# Patient Record
Sex: Male | Born: 1940
Health system: Southern US, Community
[De-identification: ages and names within clinical notes are randomized; demographics above are authoritative.]

## PROBLEM LIST (undated history)

## (undated) DIAGNOSIS — I2510 Atherosclerotic heart disease of native coronary artery without angina pectoris: Secondary | ICD-10-CM

## (undated) DIAGNOSIS — J449 Chronic obstructive pulmonary disease, unspecified: Secondary | ICD-10-CM

## (undated) DIAGNOSIS — J45909 Unspecified asthma, uncomplicated: Secondary | ICD-10-CM

## (undated) DIAGNOSIS — F329 Major depressive disorder, single episode, unspecified: Secondary | ICD-10-CM

## (undated) DIAGNOSIS — F32A Depression, unspecified: Secondary | ICD-10-CM

## (undated) DIAGNOSIS — T7840XA Allergy, unspecified, initial encounter: Secondary | ICD-10-CM

## (undated) DIAGNOSIS — C61 Malignant neoplasm of prostate: Secondary | ICD-10-CM

## (undated) DIAGNOSIS — F419 Anxiety disorder, unspecified: Secondary | ICD-10-CM

## (undated) DIAGNOSIS — F1010 Alcohol abuse, uncomplicated: Secondary | ICD-10-CM

## (undated) DIAGNOSIS — E871 Hypo-osmolality and hyponatremia: Secondary | ICD-10-CM

## (undated) HISTORY — DX: Depression, unspecified: F32.A

## (undated) HISTORY — PX: EYE SURGERY: SHX253

## (undated) HISTORY — DX: Chronic obstructive pulmonary disease, unspecified: J44.9

## (undated) HISTORY — DX: Anxiety disorder, unspecified: F41.9

## (undated) HISTORY — DX: Allergy, unspecified, initial encounter: T78.40XA

## (undated) HISTORY — DX: Major depressive disorder, single episode, unspecified: F32.9

## (undated) HISTORY — DX: Unspecified asthma, uncomplicated: J45.909

## (undated) HISTORY — DX: Malignant neoplasm of prostate: C61

## (undated) HISTORY — PX: PROSTATE SURGERY: SHX751

## (undated) HISTORY — PX: SPINE SURGERY: SHX786

---

## 1998-08-24 ENCOUNTER — Ambulatory Visit (HOSPITAL_COMMUNITY): Admission: RE | Admit: 1998-08-24 | Discharge: 1998-08-24 | Payer: Self-pay | Admitting: Family Medicine

## 1998-09-13 ENCOUNTER — Ambulatory Visit (HOSPITAL_COMMUNITY): Admission: RE | Admit: 1998-09-13 | Discharge: 1998-09-13 | Payer: Self-pay | Admitting: Neurosurgery

## 1998-09-20 ENCOUNTER — Encounter: Payer: Self-pay | Admitting: Neurosurgery

## 1998-09-20 ENCOUNTER — Ambulatory Visit (HOSPITAL_COMMUNITY): Admission: RE | Admit: 1998-09-20 | Discharge: 1998-09-20 | Payer: Self-pay | Admitting: Neurosurgery

## 1998-09-28 ENCOUNTER — Encounter: Payer: Self-pay | Admitting: Neurosurgery

## 1998-09-30 ENCOUNTER — Inpatient Hospital Stay (HOSPITAL_COMMUNITY): Admission: RE | Admit: 1998-09-30 | Discharge: 1998-10-01 | Payer: Self-pay | Admitting: Neurosurgery

## 1998-12-20 ENCOUNTER — Encounter: Payer: Self-pay | Admitting: Neurosurgery

## 1998-12-20 ENCOUNTER — Ambulatory Visit (HOSPITAL_COMMUNITY): Admission: RE | Admit: 1998-12-20 | Discharge: 1998-12-20 | Payer: Self-pay | Admitting: Neurosurgery

## 2000-01-09 ENCOUNTER — Ambulatory Visit (HOSPITAL_COMMUNITY): Admission: RE | Admit: 2000-01-09 | Discharge: 2000-01-09 | Payer: Self-pay | Admitting: Neurosurgery

## 2004-04-17 ENCOUNTER — Ambulatory Visit (HOSPITAL_COMMUNITY): Admission: RE | Admit: 2004-04-17 | Discharge: 2004-04-17 | Payer: Self-pay | Admitting: Internal Medicine

## 2008-09-15 ENCOUNTER — Ambulatory Visit: Payer: Self-pay | Admitting: Internal Medicine

## 2008-10-04 ENCOUNTER — Ambulatory Visit (HOSPITAL_COMMUNITY): Admission: RE | Admit: 2008-10-04 | Discharge: 2008-10-04 | Payer: Self-pay | Admitting: Internal Medicine

## 2008-10-04 ENCOUNTER — Ambulatory Visit: Payer: Self-pay | Admitting: Internal Medicine

## 2010-04-24 ENCOUNTER — Ambulatory Visit (HOSPITAL_COMMUNITY): Admission: RE | Admit: 2010-04-24 | Discharge: 2010-04-24 | Payer: Self-pay | Admitting: Family Medicine

## 2010-06-30 ENCOUNTER — Encounter (INDEPENDENT_AMBULATORY_CARE_PROVIDER_SITE_OTHER): Payer: Self-pay | Admitting: *Deleted

## 2010-06-30 ENCOUNTER — Telehealth (INDEPENDENT_AMBULATORY_CARE_PROVIDER_SITE_OTHER): Payer: Self-pay

## 2010-07-06 ENCOUNTER — Telehealth (INDEPENDENT_AMBULATORY_CARE_PROVIDER_SITE_OTHER): Payer: Self-pay

## 2010-07-13 ENCOUNTER — Encounter (INDEPENDENT_AMBULATORY_CARE_PROVIDER_SITE_OTHER): Payer: Self-pay | Admitting: *Deleted

## 2011-01-23 NOTE — Progress Notes (Signed)
Summary: pt needs appt faxed to [facility] for gas voucher  Phone Note Call from Patient   Caller: Patient Summary of Call: Pt called and requests his appt date and time with Dr. Jena Gauss be faxed to: Demetrio Lapping @ [phone]  so he can get a gas voucher. ( This is at [facility]) Initial call taken by: Cloria Spring LPN,  June 30, 1190 3:15 PM     Appended Document: pt needs appt faxed to [facility] for gas voucher I faxed a reminder letter to Demetrio Lapping at [phone]

## 2011-01-23 NOTE — Progress Notes (Signed)
Summary: phone note/ pt cx appt for today  Phone Note Call from Patient   Caller: Patient Summary of Call: Pt called and spoke with Darl Pikes and cancelled appt for today, then called back and told me that he is not having any difficulty swallowing now is the reason that he cancelled. York Spaniel he will call back if he does have a problem. Initial call taken by: Cloria Spring LPN,  July 06, 2010 11:25 AM

## 2011-01-23 NOTE — Letter (Signed)
Summary: Generic Letter, Intro to Referring  [facility]  [address]   [place], [place] [address]   Phone: [phone]  Fax: [phone]      July 13, 2010             RE: Joshua Whitaker   04-Sep-1941                 [address]                 [place], [place]  [address]  Dear Kemper Durie,    Pt cancelled his appt for 07/06/10 @ 11am with Dr Jena Gauss            Sincerely,    Diana Eves  [facility] Ph: [phone]   Fax: [phone]

## 2011-01-23 NOTE — Letter (Signed)
Summary: Scheduled Appointment  [facility]  [address]   [place], [place] [address]   Phone: [phone]  Fax: [phone]    June 30, 2010   Dear: Jesse Sans Heizer            DOB: 11/16/1941    I have been instructed to schedule you an appointment in our office.  Your appointment is as follows:   Date:             July 06, 2010   Time:             11AM    Please be here 15 minutes early.   Provider:         DR Jena Gauss    Please contact the office if you need to reschedule this appointment for a more convenient time.   Thank you,    Diana Eves       [facility] Ph: [phone]   Fax: [phone]

## 2011-03-20 DIAGNOSIS — I10 Essential (primary) hypertension: Secondary | ICD-10-CM | POA: Insufficient documentation

## 2011-03-20 DIAGNOSIS — E785 Hyperlipidemia, unspecified: Secondary | ICD-10-CM | POA: Insufficient documentation

## 2011-03-20 DIAGNOSIS — J431 Panlobular emphysema: Secondary | ICD-10-CM | POA: Insufficient documentation

## 2011-05-08 NOTE — Op Note (Signed)
NAME:  LOVEL, SUAZO             ACCOUNT NO.:  [id number]   MEDICAL RECORD NO.:  [id number]          PATIENT TYPE:  AMB   LOCATION:  DAY                           FACILITY:  [facility]   PHYSICIAN:  R. Roetta Sessions, M.D. DATE OF BIRTH:  04-06-1941   DATE OF PROCEDURE:  10/04/2008  DATE OF DISCHARGE:  10/04/2008                               OPERATIVE REPORT   DIAGNOSTIC COLONOSCOPY   INDICATIONS FOR PROCEDURE:  A 70 year old gentleman with change in bowel  habits since prostatectomy over a year ago.  He feels he has incomplete  evacuation.  His last colonoscopy was over 4-1/2 years ago.  Colonoscopy  is now being done.  Risks, benefits, alternatives and limitations have  been reviewed, questions answered.  Please see documentation in the  medical record.   PROCEDURE NOTE:  O2 saturation, blood pressure, pulse, and respirations  were monitored throughout the entire procedure.  Conscious sedation with  Versed 3 mg IV, Demerol 75 mg IV in divided doses.   INSTRUMENT:  Pentax video chip system.   FINDINGS:  Digital rectal exam revealed no abnormalities.   ENDOSCOPIC FINDINGS:  Prep was adequate.   Colon:  Colonic mucosa was surveyed from the rectosigmoid junction  through the left, transverse, and right colon to the appendiceal  orifice, ileocecal valve and cecum.  These structures well seen and  photographed for the record.  Terminal ileum was intubated to 10 cm.  From this level, scope was slowly and cautiously withdrawn.  All  previously mentioned mucosal surfaces were again seen.  The patient was  noted to have pan colonic diverticula.  The remainder of the colonic  mucosa and terminal ileal mucosa appeared entirely normal.  The scope  was pulled down in the rectum where thorough examination of the rectal  mucosa including retroflexed view of the anal verge demonstrated no  abnormalities.  The patient tolerated the procedure well and was  reactive to endoscopy.   IMPRESSION:  1. Normal rectum.  2. Pan colonic diverticula.  The remainder of the colonic mucosa      appeared normal.  Normal terminal ileum.   RECOMMENDATIONS:  1. [facility] Colon Health probiotic 1 capsule daily.  2. Daily Metamucil or Citrucel fiber supplement.  3. Follow-up appointment with [place] as needed.      [name], M.D.  Electronically Signed     RMR/MEDQ  D:  10/29/2008  T:  10/29/2008  Job:  [id number]   cc:   Koren Bound, M.D.
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
## 2011-05-11 NOTE — Op Note (Signed)
NAME:  Joshua Whitaker, Joshua Whitaker                       ACCOUNT NO.:  [id number]   MEDICAL RECORD NO.:  [id number]                   PATIENT TYPE:  AMB   LOCATION:  DAY                                  FACILITY:  [facility]   PHYSICIAN:  R. Roetta Sessions, M.D.              DATE OF BIRTH:  1941-06-09   DATE OF PROCEDURE:  04/17/2004  DATE OF DISCHARGE:  04/17/2004                                 OPERATIVE REPORT   PROCEDURE:  Colonoscopy with biopsy.   INDICATIONS FOR PROCEDURE:  The patient is a 70 year old gentleman who comes  for colorectal cancer screening.  He is devoid of any lower GI tract  symptoms.  He had a colonoscopy back in 1993 at [facility] which  revealed only left-sided diverticula.  No family history of colorectal  neoplasia.  Colonoscopy is now being done as a screening maneuver.  This  approach has been discussed with the patient at length.  The potential  risks, benefits, and alternatives have been reviewed.  Please see my  handwritten H&P for more information.   PROCEDURE:  O2 saturation, blood pressure, pulses, and respirations were  monitored throughout the entirety of the procedure.  Conscious sedation was  with Versed 2 mg IV, Demerol 50 mg IV in divided doses.  The instrument used  was the Olympus video chip system.   FINDINGS:  Digital rectal examination revealed no abnormalities.   ENDOSCOPIC FINDINGS:  The prep was adequate.   Rectum:  Examination of the rectal mucosa including retroflex view of the  anal verge revealed four 3-mm polyps at approximately 8 cm.  The remainder  of the rectal mucosa appeared normal.   Colon:  The colonic mucosa was surveyed from the rectosigmoid junction  through the left, transverse, right colon to the area of the appendiceal  orifice, ileocecal valve, and cecum.  These structures were well-seen and  photographed for the record.  From this level, the scope was slowly  withdrawn.  All previously mentioned mucosal surfaces were  again seen.  The  patient was noted to have only left-sided diverticula.  The remainder of the  colonic mucosa appeared normal.  The polyps in the rectum were cold  biopsy/removed.  The patient tolerated the procedure well and was reactive  in endoscopy.   IMPRESSION:  1. Diminutive polyps in the rectum, as described above.  The remainder of     the rectal mucosa appeared normal.  2. Left-sided diverticula.  The remainder of the colonic mucosa appeared     normal.   RECOMMENDATIONS:  1. Diverticulosis literature provided to [name].  2. Follow up on pathology.  3. Further recommendations to follow.      ___________________________________________  [name], M.D.   RMR/MEDQ  D:  04/25/2004  T:  04/25/2004  Job:  [id number]   cc:   Doreen Beam  [address]  [place]  [place] [address]  Fax: [phone]

## 2014-04-21 DIAGNOSIS — Z8546 Personal history of malignant neoplasm of prostate: Secondary | ICD-10-CM | POA: Insufficient documentation

## 2015-10-12 DIAGNOSIS — R911 Solitary pulmonary nodule: Secondary | ICD-10-CM | POA: Insufficient documentation

## 2015-10-17 ENCOUNTER — Other Ambulatory Visit (HOSPITAL_COMMUNITY): Payer: Self-pay | Admitting: Family Medicine

## 2015-10-17 DIAGNOSIS — R911 Solitary pulmonary nodule: Secondary | ICD-10-CM

## 2015-10-20 ENCOUNTER — Other Ambulatory Visit (HOSPITAL_COMMUNITY): Payer: Self-pay

## 2015-11-01 ENCOUNTER — Ambulatory Visit (HOSPITAL_COMMUNITY): Payer: Medicare Other

## 2015-11-28 ENCOUNTER — Ambulatory Visit (HOSPITAL_COMMUNITY)
Admission: RE | Admit: 2015-11-28 | Discharge: 2015-11-28 | Disposition: A | Discharge status: Home / Self Care | Payer: Medicare Other | Source: Ambulatory Visit | Attending: Family Medicine | Admitting: Family Medicine

## 2015-11-28 DIAGNOSIS — R911 Solitary pulmonary nodule: Secondary | ICD-10-CM | POA: Insufficient documentation

## 2015-11-28 DIAGNOSIS — F17200 Nicotine dependence, unspecified, uncomplicated: Secondary | ICD-10-CM | POA: Diagnosis not present

## 2016-10-08 ENCOUNTER — Other Ambulatory Visit (HOSPITAL_COMMUNITY): Payer: Self-pay | Admitting: Family Medicine

## 2016-10-08 DIAGNOSIS — R9389 Abnormal findings on diagnostic imaging of other specified body structures: Secondary | ICD-10-CM

## 2016-10-25 ENCOUNTER — Ambulatory Visit (HOSPITAL_COMMUNITY): Payer: Medicare Other

## 2016-10-26 ENCOUNTER — Ambulatory Visit (HOSPITAL_COMMUNITY)
Admission: RE | Admit: 2016-10-26 | Discharge: 2016-10-26 | Disposition: A | Discharge status: Home / Self Care | Payer: Medicare Other | Source: Ambulatory Visit | Attending: Family Medicine | Admitting: Family Medicine

## 2016-10-26 DIAGNOSIS — I700 Atherosclerosis of aorta: Secondary | ICD-10-CM | POA: Diagnosis not present

## 2016-10-26 DIAGNOSIS — J439 Emphysema, unspecified: Secondary | ICD-10-CM | POA: Insufficient documentation

## 2016-10-26 DIAGNOSIS — R938 Abnormal findings on diagnostic imaging of other specified body structures: Secondary | ICD-10-CM | POA: Insufficient documentation

## 2016-10-26 DIAGNOSIS — R918 Other nonspecific abnormal finding of lung field: Secondary | ICD-10-CM | POA: Diagnosis not present

## 2016-10-26 DIAGNOSIS — R9389 Abnormal findings on diagnostic imaging of other specified body structures: Secondary | ICD-10-CM

## 2017-06-27 ENCOUNTER — Other Ambulatory Visit (HOSPITAL_COMMUNITY): Payer: Self-pay | Admitting: Family Medicine

## 2017-06-27 DIAGNOSIS — R911 Solitary pulmonary nodule: Secondary | ICD-10-CM

## 2017-07-03 ENCOUNTER — Ambulatory Visit (HOSPITAL_COMMUNITY): Payer: Medicare Other

## 2017-07-09 ENCOUNTER — Ambulatory Visit (HOSPITAL_COMMUNITY): Payer: Medicare Other

## 2017-07-09 ENCOUNTER — Encounter (HOSPITAL_COMMUNITY): Payer: Self-pay

## 2017-12-03 ENCOUNTER — Ambulatory Visit: Payer: Self-pay | Admitting: Family Medicine

## 2017-12-27 ENCOUNTER — Ambulatory Visit: Payer: Self-pay | Admitting: Family Medicine

## 2018-01-03 ENCOUNTER — Ambulatory Visit (INDEPENDENT_AMBULATORY_CARE_PROVIDER_SITE_OTHER): Payer: Medicare Other | Admitting: Family Medicine

## 2018-01-03 ENCOUNTER — Encounter: Payer: Self-pay | Admitting: Family Medicine

## 2018-01-03 VITALS — BP 131/70 | HR 88 | Temp 97.0°F | Ht 67.0 in | Wt 134.2 lb

## 2018-01-03 DIAGNOSIS — J431 Panlobular emphysema: Secondary | ICD-10-CM | POA: Diagnosis not present

## 2018-01-03 DIAGNOSIS — R1900 Intra-abdominal and pelvic swelling, mass and lump, unspecified site: Secondary | ICD-10-CM

## 2018-01-03 DIAGNOSIS — R911 Solitary pulmonary nodule: Secondary | ICD-10-CM

## 2018-01-03 DIAGNOSIS — R630 Anorexia: Secondary | ICD-10-CM | POA: Diagnosis not present

## 2018-01-03 DIAGNOSIS — R799 Abnormal finding of blood chemistry, unspecified: Secondary | ICD-10-CM | POA: Diagnosis not present

## 2018-01-03 MED ORDER — AZITHROMYCIN 250 MG PO TABS: ORAL_TABLET | ORAL | 0 refills | Status: DC

## 2018-01-03 MED ORDER — MIRTAZAPINE 7.5 MG PO TABS
7.5000 mg | ORAL_TABLET | Freq: Every day | ORAL | 1 refills | Status: DC
Start: 2018-01-03 — End: 2019-01-06

## 2018-01-03 MED ORDER — TRIAMCINOLONE ACETONIDE 40 MG/ML IJ SUSP
40.0000 mg | Freq: Once | INTRAMUSCULAR | Status: AC
  Administered 2018-01-03: 40 mg via INTRAMUSCULAR

## 2018-01-03 MED ORDER — ADVAIR DISKUS 250-50 MCG/DOSE IN AEPB: 1.0000 | INHALATION_SPRAY | Freq: Two times a day (BID) | RESPIRATORY_TRACT | 11 refills | Status: DC

## 2018-01-03 NOTE — Patient Instructions (Addendum)
Great to see you!  Come back in 3-4 weeks  Please call or come back if your breathing is not improving.

## 2018-01-03 NOTE — Progress Notes (Signed)
HPI  Patient presents today tablets care.  Patient states that he feels like he is losing his appetite and having difficulty breathing.  This is been going on for about 6 weeks. On review of his records he has had a 1.2 cm pulmonary nodule in the right side seen 3 years ago that until now he has been refusing to have worked up.  He states that he is okay with a CT scan.   He still smokes. He has been smoking since he was 77 years old.  Denies fever, chills, sweats. Is tolerating food and fluids like usual but has a decreased appetite.  PMH: Prostate cancer, emphysema, depression, pulmonary nodule Surgical history: Prostate surgery Family history: Multiple cancers including lung, prostate, kidney, colon, bone cancer in multiple relatives, Social history: Smokes 1 pack/day, has smoked since 77 years old ROS: Per HPI  Objective: BP 131/70   Pulse 88   Temp (!) 97 F (36.1 C) (Oral)   Ht _0  (1.702 m)   Wt 134 lb 3.2 oz (60.9 kg)   SpO2 96%   BMI 21.02 kg/m  Gen: NAD, alert, cooperative with exam HEENT: NCAT, oropharynx moist and clear, TMs normal bilaterally, arcus senilis bilaterally, PERRLA CV: RRR, good S1/S2, no murmur Resp: CTABL, no wheezes, non-labored Abd: Soft, nontender, palpable abdominal mass midline Ext: No edema, warm Neuro: Alert and oriented, No gross deficits  Assessment and plan:  #Panlobular emphysema, COPD with exacerbation Treating with IM Kenalog plus azithromycin today, patient will call back in 2-3 days if breathing is not improving I will give him oral prednisone. I chose not to give him oral prednisone today due to ease of compliance with starting other pills  #Pulmonary nodule Patient with 1.2 cm right pulmonary nodule, now today he comes in complaining of decreased appetite and fatigue for about 6 weeks I am concerned that this has progressed, until now he has been refusing workup with his PCP. CT ordered  #Palpable abdominal  mass Ultrasound of the abdomen, heavy smoker  Decreased appetite Likely multifactorial, however I am concerned about underlying malignancy Remeron given due to the patient requesting something to help him eat, this was his primary complaint today  We have carefully reviewed his inhalers and he now understands to take Advair twice daily   Orders Placed This Encounter  Procedures  . CT Chest Wo Contrast    Standing Status:   Future    Standing Expiration Date:   03/04/2019    Order Specific Question:   Preferred imaging location?    Answer:   [facility]    Order Specific Question:   Radiology Contrast Protocol - do NOT remove file path    Answer:   [email or web address]  . [place] Art/Ven Flow Abd Pelv Doppler    Standing Status:   Future    Standing Expiration Date:   03/04/2019    Order Specific Question:   Reason for Exam (SYMPTOM  OR DIAGNOSIS REQUIRED)    Answer:   palpable abd mass    Order Specific Question:   Preferred imaging location?    Answer:   [place] CBC with Differential/Platelet  . Lipid panel  . TSH    Meds ordered this encounter  Medications  . mirtazapine (REMERON) 7.5 MG tablet    Sig: Take 1 tablet (7.5 mg total) by mouth at bedtime.    Dispense:  30 tablet    Refill:  1  . azithromycin (  ZITHROMAX) 250 MG tablet    Sig: Take 2 tablets on day 1 and 1 tablet daily after that    Dispense:  6 tablet    Refill:  0  . ADVAIR DISKUS 250-50 MCG/DOSE AEPB    Sig: Inhale 1 puff into the lungs 2 (two) times daily.    Dispense:  60 each    Refill:  [place], MD [facility] 01/03/2018, 2:06 PM

## 2018-01-03 NOTE — Addendum Note (Signed)
Addended by: Nigel Berthold C on: 01/03/2018 02:54 PM   Modules accepted: Orders

## 2018-01-04 LAB — LIPID PANEL
CHOL/HDL RATIO: 3.5 ratio (ref 0.0–5.0)
CHOLESTEROL TOTAL: 152 mg/dL (ref 100–199)
HDL: 43 mg/dL (ref >39)
LDL CALC: 95 mg/dL (ref 0–99)
TRIGLYCERIDES: 71 mg/dL (ref 0–149)
VLDL CHOLESTEROL CAL: 14 mg/dL (ref 5–40)

## 2018-01-04 LAB — CMP14+EGFR
A/G RATIO: 1.1 — AB (ref 1.2–2.2)
ALK PHOS: 69 IU/L (ref 39–117)
ALT: 11 IU/L (ref 0–44)
AST: 19 IU/L (ref 0–40)
Albumin: 3.4 g/dL — ABNORMAL LOW (ref 3.5–4.8)
BUN/Creatinine Ratio: 15 (ref 10–24)
BUN: 9 mg/dL (ref 8–27)
Bilirubin Total: 0.4 mg/dL (ref 0.0–1.2)
CALCIUM: 8.9 mg/dL (ref 8.6–10.2)
CO2: 21 mmol/L (ref 20–29)
CREATININE: 0.6 mg/dL — AB (ref 0.76–1.27)
Chloride: 94 mmol/L — ABNORMAL LOW (ref 96–106)
GFR calc Af Amer: 114 mL/min/{1.73_m2} (ref >59)
GFR calc non Af Amer: 99 mL/min/{1.73_m2} (ref >59)
GLOBULIN, TOTAL: 3.1 g/dL (ref 1.5–4.5)
Glucose: 74 mg/dL (ref 65–99)
POTASSIUM: 5.1 mmol/L (ref 3.5–5.2)
SODIUM: 128 mmol/L — AB (ref 134–144)
Total Protein: 6.5 g/dL (ref 6.0–8.5)

## 2018-01-04 LAB — CBC WITH DIFFERENTIAL/PLATELET
Basophils Absolute: 0.1 10*3/uL (ref 0.0–0.2)
Basos: 1 %
EOS (ABSOLUTE): 0.1 10*3/uL (ref 0.0–0.4)
Eos: 1 %
Hematocrit: 41.4 % (ref 37.5–51.0)
Hemoglobin: 14 g/dL (ref 13.0–17.7)
IMMATURE GRANS (ABS): 0.2 10*3/uL — AB (ref 0.0–0.1)
IMMATURE GRANULOCYTES: 2 %
LYMPHS: 17 %
Lymphocytes Absolute: 1.7 10*3/uL (ref 0.7–3.1)
MCH: 31.3 pg (ref 26.6–33.0)
MCHC: 33.8 g/dL (ref 31.5–35.7)
MCV: 92 fL (ref 79–97)
MONOS ABS: 1.2 10*3/uL — AB (ref 0.1–0.9)
Monocytes: 12 %
NEUTROS PCT: 67 %
Neutrophils Absolute: 6.7 10*3/uL (ref 1.4–7.0)
Platelets: 516 10*3/uL — ABNORMAL HIGH (ref 150–379)
RBC: 4.48 x10E6/uL (ref 4.14–5.80)
RDW: 13.2 % (ref 12.3–15.4)
WBC: 9.9 10*3/uL (ref 3.4–10.8)

## 2018-01-04 LAB — TSH: TSH: 1.75 u[IU]/mL (ref 0.450–4.500)

## 2018-01-07 ENCOUNTER — Telehealth: Payer: Self-pay | Admitting: Family Medicine

## 2018-01-07 ENCOUNTER — Other Ambulatory Visit: Payer: Self-pay

## 2018-01-07 DIAGNOSIS — D75839 Thrombocytosis, unspecified: Secondary | ICD-10-CM

## 2018-01-07 DIAGNOSIS — E871 Hypo-osmolality and hyponatremia: Secondary | ICD-10-CM

## 2018-01-07 DIAGNOSIS — D473 Essential (hemorrhagic) thrombocythemia: Secondary | ICD-10-CM

## 2018-01-08 NOTE — Telephone Encounter (Signed)
Refer to lab note °

## 2018-01-09 ENCOUNTER — Ambulatory Visit: Payer: Self-pay | Admitting: Family Medicine

## 2018-01-09 ENCOUNTER — Other Ambulatory Visit: Payer: Medicare Other

## 2018-01-09 DIAGNOSIS — E871 Hypo-osmolality and hyponatremia: Secondary | ICD-10-CM | POA: Diagnosis not present

## 2018-01-09 DIAGNOSIS — D473 Essential (hemorrhagic) thrombocythemia: Secondary | ICD-10-CM | POA: Diagnosis not present

## 2018-01-09 DIAGNOSIS — D75839 Thrombocytosis, unspecified: Secondary | ICD-10-CM

## 2018-01-10 LAB — CBC WITH DIFFERENTIAL/PLATELET
BASOS ABS: 0.1 10*3/uL (ref 0.0–0.2)
Basos: 1 %
EOS (ABSOLUTE): 0.1 10*3/uL (ref 0.0–0.4)
Eos: 2 %
HEMOGLOBIN: 13.9 g/dL (ref 13.0–17.7)
Hematocrit: 42.1 % (ref 37.5–51.0)
IMMATURE GRANS (ABS): 0.1 10*3/uL (ref 0.0–0.1)
Immature Granulocytes: 2 %
LYMPHS: 30 %
Lymphocytes Absolute: 2.1 10*3/uL (ref 0.7–3.1)
MCH: 31.2 pg (ref 26.6–33.0)
MCHC: 33 g/dL (ref 31.5–35.7)
MCV: 95 fL (ref 79–97)
MONOCYTES: 11 %
Monocytes Absolute: 0.8 10*3/uL (ref 0.1–0.9)
Neutrophils Absolute: 3.9 10*3/uL (ref 1.4–7.0)
Neutrophils: 54 %
Platelets: 385 10*3/uL — ABNORMAL HIGH (ref 150–379)
RBC: 4.45 x10E6/uL (ref 4.14–5.80)
RDW: 13.3 % (ref 12.3–15.4)
WBC: 7.1 10*3/uL (ref 3.4–10.8)

## 2018-01-10 LAB — BMP8+EGFR
BUN/Creatinine Ratio: 22 (ref 10–24)
BUN: 15 mg/dL (ref 8–27)
CALCIUM: 8.9 mg/dL (ref 8.6–10.2)
CHLORIDE: 93 mmol/L — AB (ref 96–106)
CO2: 19 mmol/L — ABNORMAL LOW (ref 20–29)
CREATININE: 0.67 mg/dL — AB (ref 0.76–1.27)
GFR calc non Af Amer: 95 mL/min/{1.73_m2} (ref >59)
GFR, EST AFRICAN AMERICAN: 109 mL/min/{1.73_m2} (ref >59)
GLUCOSE: 81 mg/dL (ref 65–99)
Potassium: 5.5 mmol/L — ABNORMAL HIGH (ref 3.5–5.2)
Sodium: 132 mmol/L — ABNORMAL LOW (ref 134–144)

## 2018-01-11 NOTE — Telephone Encounter (Signed)
Pt calling to talk to nurse about lab results

## 2018-01-13 ENCOUNTER — Ambulatory Visit: Payer: Self-pay | Admitting: Family Medicine

## 2018-01-13 MED ORDER — MIRABEGRON ER 25 MG PO TB24: 25.0000 mg | ORAL_TABLET | Freq: Every day | ORAL | 2 refills | Status: DC

## 2018-01-13 NOTE — Telephone Encounter (Signed)
Advised pt RX sent in and of MD feedback, pt voiced understanding.

## 2018-01-13 NOTE — Telephone Encounter (Signed)
I have sent myrbetriq for his urinary complaints, t typically takes 4-8 weeks to take full effect.   Laroy Apple, MD [facility] 01/13/2018, 10:03 AM

## 2018-01-13 NOTE — Telephone Encounter (Signed)
Spoke to pt regarding his test results and pt voiced understanding and is also requesting something to keep him from going to the bathroom as much. He says he discussed it at his last visit. He states he wets himself before he can make it to the bathroom.

## 2018-01-17 ENCOUNTER — Other Ambulatory Visit (HOSPITAL_COMMUNITY): Payer: Medicare Other

## 2018-01-17 ENCOUNTER — Ambulatory Visit (HOSPITAL_COMMUNITY): Admission: RE | Admit: 2018-01-17 | Payer: Medicare Other | Source: Ambulatory Visit

## 2018-01-21 DIAGNOSIS — J449 Chronic obstructive pulmonary disease, unspecified: Secondary | ICD-10-CM | POA: Diagnosis not present

## 2018-01-24 ENCOUNTER — Ambulatory Visit (HOSPITAL_COMMUNITY)
Admission: RE | Admit: 2018-01-24 | Discharge: 2018-01-24 | Disposition: A | Discharge status: Home / Self Care | Payer: Medicare Other | Source: Ambulatory Visit | Attending: Family Medicine | Admitting: Family Medicine

## 2018-01-24 ENCOUNTER — Telehealth: Payer: Self-pay | Admitting: Family Medicine

## 2018-01-24 DIAGNOSIS — I2510 Atherosclerotic heart disease of native coronary artery without angina pectoris: Secondary | ICD-10-CM | POA: Insufficient documentation

## 2018-01-24 DIAGNOSIS — R911 Solitary pulmonary nodule: Secondary | ICD-10-CM

## 2018-01-24 DIAGNOSIS — J439 Emphysema, unspecified: Secondary | ICD-10-CM | POA: Insufficient documentation

## 2018-01-24 DIAGNOSIS — R1906 Epigastric swelling, mass or lump: Secondary | ICD-10-CM | POA: Diagnosis not present

## 2018-01-24 DIAGNOSIS — R918 Other nonspecific abnormal finding of lung field: Secondary | ICD-10-CM | POA: Diagnosis not present

## 2018-01-24 DIAGNOSIS — I700 Atherosclerosis of aorta: Secondary | ICD-10-CM | POA: Diagnosis not present

## 2018-01-24 DIAGNOSIS — R1900 Intra-abdominal and pelvic swelling, mass and lump, unspecified site: Secondary | ICD-10-CM | POA: Diagnosis not present

## 2018-01-24 NOTE — Telephone Encounter (Signed)
Pt aware of CT and [place]

## 2018-02-03 ENCOUNTER — Ambulatory Visit (INDEPENDENT_AMBULATORY_CARE_PROVIDER_SITE_OTHER): Payer: Medicare Other | Admitting: Family Medicine

## 2018-02-03 ENCOUNTER — Encounter: Payer: Self-pay | Admitting: Family Medicine

## 2018-02-03 VITALS — BP 145/74 | HR 74 | Temp 98.3°F | Ht 67.0 in | Wt 136.4 lb

## 2018-02-03 DIAGNOSIS — R630 Anorexia: Secondary | ICD-10-CM

## 2018-02-03 DIAGNOSIS — R911 Solitary pulmonary nodule: Secondary | ICD-10-CM

## 2018-02-03 DIAGNOSIS — J431 Panlobular emphysema: Secondary | ICD-10-CM | POA: Diagnosis not present

## 2018-02-03 DIAGNOSIS — E875 Hyperkalemia: Secondary | ICD-10-CM

## 2018-02-03 MED ORDER — UMECLIDINIUM-VILANTEROL 62.5-25 MCG/INH IN AEPB: 1.0000 | INHALATION_SPRAY | Freq: Every day | RESPIRATORY_TRACT | 11 refills | Status: DC

## 2018-02-03 NOTE — Patient Instructions (Addendum)
Great to see you!  Stop advair, continue Anoro 1 puff once daily.

## 2018-02-03 NOTE — Progress Notes (Signed)
HPI  Patient presents today to follow-up for shortness of breath.  Patient has emphysema, We tried Anoro instead of advair and he states he can tell a big difference.   He states that he is no longer having urinary symptoms.  He is also stopped taking Remeron for appetite stimulation.  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  PMH: Smoking status noted ROS: Per HPI  Objective: BP (!) 145/74   Pulse 74   Temp 98.3 F (36.8 C) (Oral)   Ht 5' 7" (1.702 m)   Wt 136 lb 6.4 oz (61.9 kg)   SpO2 94%   BMI 21.36 kg/m  Gen: NAD, alert, cooperative with exam HEENT: NCAT, EOMI, PERRL CV: RRR, good S1/S2, no murmur Resp: CTABL, no wheezes, non-labored Abd: SNTND, BS present, no guarding or organomegaly Ext: No edema, warm Neuro: Alert and oriented, No gross deficits  Assessment and plan:  #Emphysema Patient doing better on anticholinergic inhaler compared to steroid all, changing over to Anoro Recommended decreasing and stopping smoking, he is cutting back.  #Decreased appetite Patient states that he did notice a little improvement with mirtazapine, however he stopped about 1 week ago. He has had approximately 2 pound weight gain.  # hyperkalemia Repeat labs, asymptomatic  #Left-sided pulmonary nodule New nodule, repeat CT in 3 months, recommended patient follow-up in 2 months when we will order the CT     Orders Placed This Encounter  Procedures  . Borden, MD [facility] 02/03/2018, 2:08 PM

## 2018-02-04 LAB — BMP8+EGFR
BUN/Creatinine Ratio: 18 (ref 10–24)
BUN: 12 mg/dL (ref 8–27)
CO2: 21 mmol/L (ref 20–29)
Calcium: 8.6 mg/dL (ref 8.6–10.2)
Chloride: 92 mmol/L — ABNORMAL LOW (ref 96–106)
Creatinine, Ser: 0.68 mg/dL — ABNORMAL LOW (ref 0.76–1.27)
GFR calc Af Amer: 109 mL/min/{1.73_m2} (ref >59)
GFR calc non Af Amer: 94 mL/min/{1.73_m2} (ref >59)
Glucose: 76 mg/dL (ref 65–99)
Potassium: 4.7 mmol/L (ref 3.5–5.2)
Sodium: 127 mmol/L — ABNORMAL LOW (ref 134–144)

## 2018-02-20 DIAGNOSIS — J449 Chronic obstructive pulmonary disease, unspecified: Secondary | ICD-10-CM | POA: Diagnosis not present

## 2018-02-28 ENCOUNTER — Telehealth: Payer: Self-pay | Admitting: *Deleted

## 2018-02-28 MED ORDER — ALBUTEROL SULFATE HFA 108 (90 BASE) MCG/ACT IN AERS
2.0000 | INHALATION_SPRAY | Freq: Four times a day (QID) | RESPIRATORY_TRACT | 0 refills | Status: DC | PRN
Start: 2018-02-28 — End: 2018-03-03

## 2018-02-28 NOTE — Telephone Encounter (Signed)
Fax received [employer] HFA 90 Mcg INH 8.50 gm Not on med list  Please advise

## 2018-02-28 NOTE — Telephone Encounter (Signed)
Refill albuterol, pt previously using combivent respimat.   Laroy Apple, MD [facility] 02/28/2018, 11:25 AM

## 2018-03-03 ENCOUNTER — Other Ambulatory Visit: Payer: Self-pay | Admitting: *Deleted

## 2018-03-03 ENCOUNTER — Other Ambulatory Visit: Payer: Self-pay | Admitting: Family Medicine

## 2018-03-03 MED ORDER — ALBUTEROL SULFATE HFA 108 (90 BASE) MCG/ACT IN AERS: 2.0000 | INHALATION_SPRAY | Freq: Four times a day (QID) | RESPIRATORY_TRACT | 0 refills | Status: DC | PRN

## 2018-03-03 NOTE — Telephone Encounter (Signed)
Inhaler sent to pharmacy

## 2018-03-21 DIAGNOSIS — J449 Chronic obstructive pulmonary disease, unspecified: Secondary | ICD-10-CM | POA: Diagnosis not present

## 2018-03-25 ENCOUNTER — Encounter: Payer: Self-pay | Admitting: *Deleted

## 2018-03-25 ENCOUNTER — Ambulatory Visit (INDEPENDENT_AMBULATORY_CARE_PROVIDER_SITE_OTHER): Payer: Medicare Other | Admitting: *Deleted

## 2018-03-25 VITALS — BP 178/72 | HR 62 | Ht 67.5 in | Wt 136.0 lb

## 2018-03-25 DIAGNOSIS — Z0000 Encounter for general adult medical examination without abnormal findings: Secondary | ICD-10-CM | POA: Diagnosis not present

## 2018-03-25 NOTE — Patient Instructions (Signed)
  Joshua Whitaker , Thank you for taking time to come for your Medicare Wellness Visit. I appreciate your ongoing commitment to your health goals. Please review the following plan we discussed and let me know if I can assist you in the future.   These are the goals we discussed: Goals    . DIET - INCREASE WATER INTAKE    . Exercise 150 min/wk Moderate Activity       This is a list of the screening recommended for you and due dates:  Health Maintenance  Topic Date Due  . Colon Cancer Screening  10/20/1993  . Tetanus Vaccine  08/21/2016  . Flu Shot  09/05/2018*  . Pneumonia vaccines  Completed  *Topic was postponed. The date shown is not the original due date.

## 2018-03-25 NOTE — Progress Notes (Addendum)
Subjective:   Joshua Whitaker is a 77 y.o. male who presents for a subsequent Medicare Annual Wellness Visit. Joshua Whitaker is married and lives at home with his wife. He has 3 daughters and 1 son and many grandchildren. He has steps in his home without rails but that area is mainly used for storage and he doesn't have to climb the steps often.   Review of Systems  His health is about the same as last year.   Cardiac Risk Factors include: advanced age (>23mn, >>12women);hypertension;male gender;smoking/ tobacco exposure  Other systems negative.    Objective:    Today's Vitals   03/25/18 1027 03/25/18 1029  BP: (!) 178/72   Pulse: 62   Weight: 136 lb (61.7 kg)   Height: 5' 7.5" (1.715 m)   PainSc:  6    Body mass index is 20.99 kg/m.  Advanced Directives 03/25/2018  Does Patient Have a Medical Advance Directive? No  Would patient like information on creating a medical advance directive? No - Patient declined    Current Medications (verified) Outpatient Encounter Medications as of 03/25/2018  Medication Sig  . albuterol (PROVENTIL HFA;VENTOLIN HFA) 108 (90 Base) MCG/ACT inhaler Inhale 2 puffs into the lungs every 6 (six) hours as needed for wheezing or shortness of breath.  .Marland Kitchenalbuterol (PROVENTIL) (2.5 MG/3ML) 0.083% nebulizer solution Take 2.5 mg by nebulization every 4 (four) hours as needed.  . cholestyramine (QUESTRAN) 4 g packet   . fluticasone (FLONASE) 50 MCG/ACT nasal spray Place 1 spray into both nostrils daily.  . Ipratropium-Albuterol (COMBIVENT RESPIMAT) 20-100 MCG/ACT AERS respimat Take 1 puff by mouth every 6 (six) hours.  . Iron-Vitamins (GERITOL PO) Take by mouth.  . mirtazapine (REMERON) 7.5 MG tablet Take 1 tablet (7.5 mg total) by mouth at bedtime.  .Marland Kitchenumeclidinium-vilanterol (ANORO ELLIPTA) 62.5-25 MCG/INH AEPB Inhale 1 puff into the lungs daily.   No facility-administered encounter medications on file as of 03/25/2018.     Allergies (verified) Tiotropium  bromide monohydrate; Varenicline; Iodinated diagnostic agents; and Statins   History: Past Medical History:  Diagnosis Date  . Allergy   . Anxiety   . Asthma   . Cancer ([place]   prosate  . COPD [place] obstructive pulmonary disease) [place]   . Depression   . Prostate cancer ([facility]    Past Surgical History:  Procedure Laterality Date  . EYE SURGERY    . PROSTATE SURGERY    . SPINE SURGERY     Family History  Problem Relation Age of Onset  . Heart disease Mother   . Arthritis Mother   . Heart disease Father   . Cancer Sister        kidney  . Cancer Brother        lung  . Cancer Brother        lung  . Stroke Brother   . Cancer Brother        prostate  . Cancer Sister        colon  . Cancer Sister        bone  . Diabetes Sister   . Chromosomal disorder Sister   . Alcohol abuse Daughter   . Rheum arthritis Daughter   . Rheum arthritis Daughter    Social History   Socioeconomic History  . Marital status: Married    Spouse name: Not on file  . Number of children: 3  . Years of education: 5th Grade and GED  . Highest education  level: GED or equivalent  Occupational History  . Occupation: Retired    Comment: Sales promotion account executive  Social Needs  . Financial resource strain: Not hard at all  . Food insecurity:    Worry: Never true    Inability: Never true  . Transportation needs:    Medical: No    Non-medical: No  Tobacco Use  . Smoking status: Current Every Day Smoker    Packs/day: 1.00    Types: Cigarettes  . Smokeless tobacco: Never Used  Substance and Sexual Activity  . Alcohol use: Yes    Comment: occ  . Drug use: No  . Sexual activity: Not Currently  Lifestyle  . Physical activity:    Days per week: 5 days    Minutes per session: 30 min  . Stress: Only a little  Relationships  . Social connections:    Talks on phone: More than three times a week    Gets together: More than three times a week    Attends religious service: Never    Active  member of club or organization: No    Attends meetings of clubs or organizations: Never    Relationship status: Not on file  Other Topics Concern  . Not on file  Social History Narrative  . Not on file   Tobacco Counseling Ready to quit: Not Answered Counseling given: Not Answered not interested in quitting at this time  Clinical Intake:  Pre-visit preparation completed: No  Pain : 0-10 Pain Score: 6  Pain Type: Chronic pain Pain Location: Generalized(rheumatoid arthritis)     Diabetes: No  How often do you need to have someone help you when you read instructions, pamphlets, or other written materials from your doctor or pharmacy?: 1 - Never What is the last grade level you completed in school?: 5th-GED     Information entered by :: Chong Sicilian, RN  Activities of Daily Living In your present state of health, do you have any difficulty performing the following activities: 03/25/2018  Hearing? N  Vision? N  Comment Eye exam is due this month  Difficulty concentrating or making decisions? N  Walking or climbing stairs? N  Dressing or bathing? N  Doing errands, shopping? N  Preparing Food and eating ? N  Using the Toilet? N  In the past six months, have you accidently leaked urine? N  Do you have problems with loss of bowel control? N  Managing your Medications? N  Managing your Finances? N  Housekeeping or managing your Housekeeping? N  Some recent data might be hidden     Immunizations and Health Maintenance Immunization History  Administered Date(s) Administered  . Influenza Split 11/19/2008, 09/04/2010  . Influenza, Seasonal, Injecte, Preservative Fre 10/19/2013, 10/04/2014, 09/27/2016  . Influenza,inj,Quad PF,6+ Mos 11/26/2017  . Pneumococcal Conjugate-13 10/18/2014  . Pneumococcal Polysaccharide-23 08/22/2016  . Pneumococcal-Unspecified 10/10/2009  . Tdap 08/21/2006  . Zoster 10/24/2013   Health Maintenance Due  Topic Date Due  . COLONOSCOPY   10/20/1993  . TETANUS/TDAP  08/21/2016    Patient Care Team: Timmothy Euler, MD as PCP - General (Family Medicine)  No hospitalizations, ER visits, or surgeries this past year.      Assessment:   This is a routine wellness examination for [facility].  Hearing/Vision screen No deficits noted during visit. Eye exam is overdue  Dietary issues and exercise activities discussed:  Diet 2 to 3 glasses of water a day Drinks about 9 cups of coffee a day 2 meals  a day-mostly home prepared  Current Exercise Habits: Home exercise routine, Type of exercise: walking, Time (Minutes): 30, Frequency (Times/Week): 7, Weekly Exercise (Minutes/Week): 210, Intensity: Moderate, Exercise limited by: respiratory conditions(s)  Goals    . DIET - INCREASE WATER INTAKE    . Exercise 150 min/wk Moderate Activity      Depression Screen PHQ 2/9 Scores 03/25/2018 02/03/2018 01/03/2018  PHQ - 2 Score 0 0 0    Fall Risk Fall Risk  03/25/2018 02/03/2018 01/03/2018  Falls in the past year? No No No    Is the patient's home free of loose throw rugs in walkways, pet beds, electrical cords, etc?   yes      Grab bars in the bathroom? no      Handrails on the stairs?   no      Adequate lighting?   yes   Cognitive Function: MMSE - Mini Mental State Exam 03/25/2018  Not completed: Unable to complete  Orientation to time 4  Orientation to Place 5  Registration 3  Attention/ Calculation (No Data)  Attention/Calculation-comments unable to spell  Recall 2  Language- name 2 objects 2  Language- repeat 1  Language- follow 3 step command 3  Language- read & follow direction 1  Write a sentence 0  Write a sentence-comments did not attempt  Copy design 1  Copy design-comments 1  Although MMSE had it's limitations the Mini Cog was normal and I don't feel like the patient has any cognitive or memory impairments      Screening Tests Health Maintenance  Topic Date Due  . COLONOSCOPY  10/20/1993  . TETANUS/TDAP   08/21/2016  . INFLUENZA VACCINE  09/05/2018 (Originally 07/24/2018)  . PNA vac Low Risk Adult  Completed   Insurance company advised that they would send out a colon cancer screening kit.      Plan:   Keep appt with Dr Wendi Snipes next week Repeat Chest CT next month as recommended Increase water intake Increase activity level as tolerated. Aim for 150 minutes of moderate activity a week If insurance company doesn't send colon cancer screening kit let us know and we will order one  I have personally reviewed and noted the following in the patient's chart:   . Medical and social history . Use of alcohol, tobacco or illicit drugs  . Current medications and supplements . Functional ability and status . Nutritional status . Physical activity . Advanced directives . List of other physicians . Hospitalizations, surgeries, and ER visits in previous 12 months . Vitals . Screenings to include cognitive, depression, and falls . Referrals and appointments  In addition, I have reviewed and discussed with patient certain preventive protocols, quality metrics, and best practice recommendations. A written personalized care plan for preventive services as well as general preventive health recommendations were provided to patient.       I have reviewed and agree with the above AWV documentation.   Laroy Apple, MD [facility] 03/27/2018, 12:09 PM      Chong Sicilian, RN   03/26/2018

## 2018-04-03 ENCOUNTER — Encounter: Payer: Self-pay | Admitting: Family Medicine

## 2018-04-03 ENCOUNTER — Ambulatory Visit (INDEPENDENT_AMBULATORY_CARE_PROVIDER_SITE_OTHER): Payer: Medicare Other | Admitting: Family Medicine

## 2018-04-03 VITALS — BP 194/80 | HR 68 | Temp 96.9°F | Ht 67.5 in | Wt 137.4 lb

## 2018-04-03 DIAGNOSIS — E871 Hypo-osmolality and hyponatremia: Secondary | ICD-10-CM | POA: Diagnosis not present

## 2018-04-03 DIAGNOSIS — R918 Other nonspecific abnormal finding of lung field: Secondary | ICD-10-CM | POA: Diagnosis not present

## 2018-04-03 DIAGNOSIS — I10 Essential (primary) hypertension: Secondary | ICD-10-CM

## 2018-04-03 DIAGNOSIS — R911 Solitary pulmonary nodule: Secondary | ICD-10-CM | POA: Diagnosis not present

## 2018-04-03 DIAGNOSIS — J431 Panlobular emphysema: Secondary | ICD-10-CM | POA: Diagnosis not present

## 2018-04-03 MED ORDER — ALBUTEROL SULFATE HFA 108 (90 BASE) MCG/ACT IN AERS: 2.0000 | INHALATION_SPRAY | Freq: Four times a day (QID) | RESPIRATORY_TRACT | 0 refills | Status: DC | PRN

## 2018-04-03 MED ORDER — AMLODIPINE BESYLATE 5 MG PO TABS
5.0000 mg | ORAL_TABLET | Freq: Every day | ORAL | 11 refills | Status: DC
Start: 2018-04-03 — End: 2018-09-10

## 2018-04-03 MED ORDER — TRIAMCINOLONE 0.1 % CREAM:EUCERIN CREAM 1:1: 1.0000 "application " | TOPICAL_CREAM | Freq: Every day | CUTANEOUS | 1 refills | Status: DC

## 2018-04-03 NOTE — Patient Instructions (Addendum)
Great to see you!  Come back in 3 months unless you need us sooner.    

## 2018-04-03 NOTE — Progress Notes (Signed)
   HPI  Patient presents today for follow-up chronic medical conditions.  Emphysema Patient states his breathing is improved, he is breathing well most of the time. He requests refill of albuterol  Lung nodule He is okay with CAT scan follow-up, however states that he would not pursue aggressive treatment if lung cancer was found.  Hyponatremia Patient reports high salt diet.  Hypertension Patient with elevated blood pressure second visit in a row No headache or chest pain  PMH: Smoking status noted ROS: Per HPI  Objective: BP (!) 194/80   Pulse 68   Temp (!) 96.9 F (36.1 C) (Oral)   Ht 5' 7.5" (1.715 m)   Wt 137 lb 6.4 oz (62.3 kg)   BMI 21.20 kg/m  Gen: NAD, alert, cooperative with exam HEENT: NCAT CV: RRR, good S1/S2, no murmur Resp: CTABL, no wheezes, non-labored Ext: No edema, warm Neuro: Alert and oriented, No gross deficits  Assessment and plan:  #Hypertension New onset Starting medium dose amlodipine Follow-up 1-2 months  #Hyponatremia Repeat BMP, long-standing but this has been slightly more severe than previous Asymptomatic  #Emphysema Improved on anoro Refill albuterol  #Lung nodule, abnormal findings on CT Repeat CT chest    Orders Placed This Encounter  Procedures  . CT Chest Wo Contrast    Standing Status:   Future    Standing Expiration Date:   06/04/2019    Order Specific Question:   Preferred imaging location?    Answer:   [facility]    Order Specific Question:   Radiology Contrast Protocol - do NOT remove file path    Answer:   [email or web address]  . BMP8+EGFR    Meds ordered this encounter  Medications  . amLODipine (NORVASC) 5 MG tablet    Sig: Take 1 tablet (5 mg total) by mouth daily.    Dispense:  30 tablet    Refill:  11  . albuterol (PROVENTIL HFA;VENTOLIN HFA) 108 (90 Base) MCG/ACT inhaler    Sig: Inhale 2 puffs into the lungs every 6 (six) hours as needed for wheezing or shortness  of breath.    Dispense:  1 Inhaler    Refill:  0  . Triamcinolone Acetonide (TRIAMCINOLONE 0.1 % CREAM : EUCERIN) CREA    Sig: Apply 1 application topically daily.    Dispense:  1 each    Refill:  [place], MD [facility] 04/03/2018, 1:19 PM

## 2018-04-04 ENCOUNTER — Telehealth: Payer: Self-pay | Admitting: Family Medicine

## 2018-04-04 LAB — BMP8+EGFR
BUN/Creatinine Ratio: 10 (ref 10–24)
BUN: 6 mg/dL — ABNORMAL LOW (ref 8–27)
CALCIUM: 8.9 mg/dL (ref 8.6–10.2)
CO2: 24 mmol/L (ref 20–29)
CREATININE: 0.6 mg/dL — AB (ref 0.76–1.27)
Chloride: 94 mmol/L — ABNORMAL LOW (ref 96–106)
GFR calc Af Amer: 114 mL/min/{1.73_m2} (ref >59)
GFR, EST NON AFRICAN AMERICAN: 99 mL/min/{1.73_m2} (ref >59)
Glucose: 86 mg/dL (ref 65–99)
Potassium: 4.3 mmol/L (ref 3.5–5.2)
SODIUM: 131 mmol/L — AB (ref 134–144)

## 2018-04-04 NOTE — Telephone Encounter (Signed)
Pt aware.

## 2018-04-21 ENCOUNTER — Ambulatory Visit (HOSPITAL_COMMUNITY): Payer: Medicare Other

## 2018-04-21 DIAGNOSIS — J449 Chronic obstructive pulmonary disease, unspecified: Secondary | ICD-10-CM | POA: Diagnosis not present

## 2018-04-22 ENCOUNTER — Ambulatory Visit (HOSPITAL_COMMUNITY): Payer: Medicare Other

## 2018-04-28 ENCOUNTER — Telehealth: Payer: Self-pay | Admitting: Family Medicine

## 2018-04-28 DIAGNOSIS — Z1211 Encounter for screening for malignant neoplasm of colon: Secondary | ICD-10-CM

## 2018-04-28 NOTE — Telephone Encounter (Signed)
Patient aware.

## 2018-04-28 NOTE — Telephone Encounter (Signed)
Calling in for status of cologuard. I am unsure so will send again.   Laroy Apple, MD [facility] 04/28/2018, 12:18 PM

## 2018-04-30 ENCOUNTER — Ambulatory Visit (HOSPITAL_COMMUNITY)
Admission: RE | Admit: 2018-04-30 | Discharge: 2018-04-30 | Disposition: A | Discharge status: Home / Self Care | Payer: Medicare Other | Source: Ambulatory Visit | Attending: Family Medicine | Admitting: Family Medicine

## 2018-04-30 DIAGNOSIS — I2510 Atherosclerotic heart disease of native coronary artery without angina pectoris: Secondary | ICD-10-CM | POA: Diagnosis not present

## 2018-04-30 DIAGNOSIS — I700 Atherosclerosis of aorta: Secondary | ICD-10-CM | POA: Insufficient documentation

## 2018-04-30 DIAGNOSIS — J439 Emphysema, unspecified: Secondary | ICD-10-CM | POA: Diagnosis not present

## 2018-04-30 DIAGNOSIS — R918 Other nonspecific abnormal finding of lung field: Secondary | ICD-10-CM | POA: Diagnosis not present

## 2018-04-30 DIAGNOSIS — J479 Bronchiectasis, uncomplicated: Secondary | ICD-10-CM | POA: Insufficient documentation

## 2018-05-15 ENCOUNTER — Ambulatory Visit: Payer: Medicare Other | Admitting: Family Medicine

## 2018-05-21 ENCOUNTER — Other Ambulatory Visit: Payer: Self-pay | Admitting: Family Medicine

## 2018-05-21 DIAGNOSIS — J449 Chronic obstructive pulmonary disease, unspecified: Secondary | ICD-10-CM | POA: Diagnosis not present

## 2018-05-29 ENCOUNTER — Telehealth: Payer: Self-pay | Admitting: Family Medicine

## 2018-05-29 NOTE — Telephone Encounter (Signed)
Per DPR notified patient's wife [name] that we have not received the Cologuard results yet.

## 2018-06-17 ENCOUNTER — Encounter: Payer: Self-pay | Admitting: Family Medicine

## 2018-06-17 ENCOUNTER — Ambulatory Visit (INDEPENDENT_AMBULATORY_CARE_PROVIDER_SITE_OTHER): Payer: Medicare Other | Admitting: Family Medicine

## 2018-06-17 VITALS — BP 130/85 | HR 87 | Temp 97.1°F | Ht 67.5 in | Wt 143.6 lb

## 2018-06-17 DIAGNOSIS — J431 Panlobular emphysema: Secondary | ICD-10-CM

## 2018-06-17 DIAGNOSIS — M75102 Unspecified rotator cuff tear or rupture of left shoulder, not specified as traumatic: Secondary | ICD-10-CM

## 2018-06-17 MED ORDER — PREDNISONE 10 MG PO TABS: ORAL_TABLET | ORAL | 0 refills | Status: DC

## 2018-06-17 MED ORDER — AZITHROMYCIN 250 MG PO TABS: ORAL_TABLET | ORAL | 0 refills | Status: DC

## 2018-06-17 NOTE — Patient Instructions (Signed)
Great to see you!  I have prescribed prednisone and azithromycin for your breathing.  The prednisone is similar to the medication that we would inject in your shoulder for pain, you may see good improvement with the pills.  If your shoulder is not better the next time we talk we could consider an injection in the shoulder.  I have also referred you to Dr. Luan Pulling, pulmonologist in [place].

## 2018-06-17 NOTE — Progress Notes (Signed)
   HPI  Patient presents today for difficulty breathing and shoulder pain.  Shortness of breath and increased cough for about 2 weeks. Patient states that Combivent is still helpful, he is also still taking Anoro. He states that he has not had any fever, chills, sweats He is a good appetite is tolerating food easily. Breathes better when it rains or when he is outside on the porch  Also complains of left shoulder pain.  He states that it is getting harder and harder to lift his shoulder above 90 degrees. He has achy pain.   PMH: Smoking status noted ROS: Per HPI  Objective: BP 130/85   Pulse 87   Temp (!) 97.1 F (36.2 C) (Oral)   Ht 5' 7.5" (1.715 m)   Wt 143 lb 9.6 oz (65.1 kg)   SpO2 95%   BMI 22.16 kg/m  Gen: NAD, alert, cooperative with exam HEENT: NCAT CV: RRR, good S1/S2, no murmur Resp: Nonlabored, reasonable air movement, no added sounds Ext: No edema, warm Neuro: Alert and oriented, No gross deficits  Assessment and plan:  #Emphysema Emphysema exacerbation/COPD exacerbation Azithromycin plus prednisone Refer to pulmonology, appreciate Dr. Luan Pulling opinion  #Rotator cuff syndrome Patient would likely do well with a steroid injection, however I am giving prednisone today which may help without the injection. He has a follow-up next month, discuss injection at that time *  Orders Placed This Encounter  Procedures  . Ambulatory referral to Pulmonology    Referral Priority:   Routine    Referral Type:   Consultation    Referral Reason:   Specialty Services Required    Referred to Provider:   Sinda Du, MD    Requested Specialty:   Pulmonary Disease    Number of Visits Requested:   1    Meds ordered this encounter  Medications  . predniSONE (DELTASONE) 10 MG tablet    Sig: Take 4 pills a day for 3 days, then 3 pills a day for 3 days, then 2 pills a day for 3 days, then 1 pill a day for 3 days, then stop    Dispense:  30 tablet    Refill:  0  .  azithromycin (ZITHROMAX) 250 MG tablet    Sig: Take 2 tablets on day 1 and 1 tablet daily after that    Dispense:  6 tablet    Refill:  0    Laroy Apple, MD [facility] 06/17/2018, 3:24 PM

## 2018-06-21 DIAGNOSIS — J449 Chronic obstructive pulmonary disease, unspecified: Secondary | ICD-10-CM | POA: Diagnosis not present

## 2018-07-07 ENCOUNTER — Ambulatory Visit: Payer: Medicare Other | Admitting: Family Medicine

## 2018-07-08 ENCOUNTER — Ambulatory Visit (INDEPENDENT_AMBULATORY_CARE_PROVIDER_SITE_OTHER): Payer: Medicare Other | Admitting: Family Medicine

## 2018-07-08 ENCOUNTER — Encounter: Payer: Self-pay | Admitting: Family Medicine

## 2018-07-08 VITALS — BP 124/58 | HR 89 | Temp 97.0°F | Ht 67.5 in | Wt 144.2 lb

## 2018-07-08 DIAGNOSIS — J431 Panlobular emphysema: Secondary | ICD-10-CM | POA: Diagnosis not present

## 2018-07-08 NOTE — Progress Notes (Signed)
   HPI  Patient presents today follow-up chronic medical conditions.  Emphysema Patient reports good medication compliance with Anoro, he also gets good relief from albuterol. Patient does not have air conditioning and states that during the day in the heat he has a difficult time breathing.  He states that he is having mild shortness of breath the last 2 to 3 days but states that it is about the same as usual.   PMH: Smoking status noted ROS: Per HPI  Objective: BP (!) 124/58   Pulse 89   Temp (!) 97 F (36.1 C) (Oral)   Ht 5' 7.5" (1.715 m)   Wt 144 lb 3.2 oz (65.4 kg)   SpO2 92%   BMI 22.25 kg/m  Gen: NAD, alert, cooperative with exam HEENT: NCAT CV: RRR, good S1/S2, no murmur Resp: Nonlabored, good air movement, expiratory wheezes scattered Ext: No edema, warm Neuro: Alert and oriented, No gross deficits  Assessment and plan:  #Emphysema At baseline today Patient has follow-up with pulmonology coming up until about 2 weeks. Continue Anoro daily and as needed albuterol Discussed smoking    Laroy Apple, MD [facility] 07/08/2018, 1:14 PM

## 2018-07-08 NOTE — Patient Instructions (Signed)
Great to see you!  Be sure to keep your appointment with Dr. Luan Pulling.   Come back to see Dr. Livia Snellen in 3 months.

## 2018-07-21 DIAGNOSIS — J449 Chronic obstructive pulmonary disease, unspecified: Secondary | ICD-10-CM | POA: Diagnosis not present

## 2018-09-04 ENCOUNTER — Telehealth: Payer: Self-pay | Admitting: *Deleted

## 2018-09-04 NOTE — Telephone Encounter (Signed)
Fax received [employer] RF Myrbetriq ER 25 mg tab #30 1 QD Please advise. They last filled 07/31/18

## 2018-09-05 MED ORDER — MIRABEGRON ER 25 MG PO TB24: 25.0000 mg | ORAL_TABLET | Freq: Every day | ORAL | 1 refills | Status: DC

## 2018-09-05 NOTE — Telephone Encounter (Signed)
Okay to refill.  WS 

## 2018-09-05 NOTE — Telephone Encounter (Signed)
Med sent in.

## 2018-09-10 ENCOUNTER — Encounter: Payer: Self-pay | Admitting: Family Medicine

## 2018-09-10 ENCOUNTER — Ambulatory Visit (INDEPENDENT_AMBULATORY_CARE_PROVIDER_SITE_OTHER): Payer: Medicare Other | Admitting: Family Medicine

## 2018-09-10 VITALS — BP 175/69 | HR 71 | Temp 97.0°F | Ht 67.5 in | Wt 144.4 lb

## 2018-09-10 DIAGNOSIS — J431 Panlobular emphysema: Secondary | ICD-10-CM | POA: Diagnosis not present

## 2018-09-10 DIAGNOSIS — I10 Essential (primary) hypertension: Secondary | ICD-10-CM

## 2018-09-10 DIAGNOSIS — F411 Generalized anxiety disorder: Secondary | ICD-10-CM | POA: Diagnosis not present

## 2018-09-10 MED ORDER — AMLODIPINE BESYLATE 5 MG PO TABS: 5.0000 mg | ORAL_TABLET | Freq: Every day | ORAL | 11 refills | Status: DC

## 2018-09-10 MED ORDER — FLUTICASONE-UMECLIDIN-VILANT 100-62.5-25 MCG/INH IN AEPB: 1.0000 | INHALATION_SPRAY | Freq: Every day | RESPIRATORY_TRACT | 11 refills | Status: DC

## 2018-09-10 MED ORDER — IPRATROPIUM-ALBUTEROL 20-100 MCG/ACT IN AERS: 2.0000 | INHALATION_SPRAY | Freq: Four times a day (QID) | RESPIRATORY_TRACT | 11 refills | Status: DC

## 2018-09-10 NOTE — Progress Notes (Signed)
Subjective:  Patient ID: ARIAS WEINERT, male    DOB: [birth year]  Age: 77 y.o. MRN: [id number]  CC: Emphysema (pt here today c/o on and off trouble breathing and also "nerves is acting up")   HPI JEFERSON BOOZER presents for Shortness of breath daily.  He does not have air conditioning but he likes to sit outside in the shade a lot.  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  However he has a lot of cough he is bringing up some a.m. sputum.  He has been wheezing significantly as well.  It is worse when he exerts himself.  Patient is treated for high blood pressure but quit taking his medicine just because he does not like to take pills.  He says he does have a strong family history of heart disease in both parents and his son.  Patient having a lot of anxiety based on his brother-in-law wanting to move in with him.  Is not appropriate and he has had some conflict with his wife over that.  However, his wife is in agreement with him now but his brother-in-law keeps pestering him about it.  GAD-7 below reviewed.  Patient declines treatment at this time  GAD 7 : Generalized Anxiety Score 09/10/2018  Nervous, Anxious, on Edge 3  Control/stop worrying 2  Worry too much - different things 1  Trouble relaxing 0  Restless 0  Easily annoyed or irritable 2  Afraid - awful might happen 0  Total GAD 7 Score 8  Anxiety Difficulty Somewhat difficult      Depression screen [facility] 2/9 09/10/2018 07/08/2018 06/17/2018  Decreased Interest 1 0 0  Down, Depressed, Hopeless 1 0 1  PHQ - 2 Score 2 0 1  Altered sleeping 0 - -  Tired, decreased energy 0 - -  Change in appetite 0 - -  Feeling bad or failure about yourself  0 - -  Trouble concentrating 0 - -  Moving slowly or fidgety/restless 0 - -  Suicidal thoughts 0 - -  PHQ-9 Score 2 - -    History Remus has a past medical history of Allergy, Anxiety, Asthma, Cancer ([place]) (2007), COPD (chronic  obstructive pulmonary disease) ([place]), Depression, and Prostate cancer ([place]).   He has a past surgical history that includes Eye surgery; Prostate surgery; and Spine surgery.   His family history includes Alcohol abuse in his daughter; Arthritis in his mother; Cancer in his brother, brother, brother, sister, sister, and sister; Chromosomal disorder in his sister; Diabetes in his sister; Heart disease in his father and mother; Rheum arthritis in his daughter and daughter; Stroke in his brother.He reports that he has been smoking cigarettes. He has been smoking about 1.00 pack per day. He has never used smokeless tobacco. He reports that he drinks alcohol. He reports that he does not use drugs.    ROS Review of Systems  Constitutional: Negative.   HENT: Negative.   Eyes: Negative for visual disturbance.  Respiratory: Positive for cough, shortness of breath and wheezing.   Cardiovascular: Negative for chest pain and leg swelling.  Gastrointestinal: Negative for abdominal pain, diarrhea, nausea and vomiting.  Genitourinary: Negative for difficulty urinating.  Musculoskeletal: Negative for arthralgias and myalgias.  Skin: Negative for rash.  Allergic/Immunologic: Negative for food allergies.  Neurological: Negative for headaches.  Psychiatric/Behavioral: Negative for sleep disturbance. The patient is nervous/anxious.     Objective:  BP Marland Kitchen)  175/69   Pulse 71   Temp (!) 97 F (36.1 C) (Oral)   Ht 5' 7.5" (1.715 m)   Wt 144 lb 6 oz (65.5 kg)   SpO2 98% Comment: on room air  BMI 22.28 kg/m    BP Readings from Last 3 Encounters:  09/10/18 (!) 175/69  07/08/18 (!) 124/58  06/17/18 130/85    Wt Readings from Last 3 Encounters:  09/10/18 144 lb 6 oz (65.5 kg)  07/08/18 144 lb 3.2 oz (65.4 kg)  06/17/18 143 lb 9.6 oz (65.1 kg)     Physical Exam  Constitutional: He is oriented to person, place, and time. He appears well-developed and well-nourished. No distress.  HENT:  Head:  Normocephalic and atraumatic.  Right Ear: External ear normal.  Left Ear: External ear normal.  Nose: Nose normal.  Mouth/Throat: Oropharynx is clear and moist.  Eyes: Pupils are equal, round, and reactive to light. Conjunctivae and EOM are normal.  Neck: Normal range of motion. Neck supple.  Cardiovascular: Normal rate, regular rhythm and normal heart sounds.  No murmur heard. Pulmonary/Chest: Effort normal. No respiratory distress. He has wheezes. He has no rales.  Breath sounds distant, few scattered wheezes   Abdominal: Soft. There is no tenderness.  Musculoskeletal: Normal range of motion.  Neurological: He is alert and oriented to person, place, and time. He has normal reflexes.  Skin: Skin is warm and dry.  Psychiatric: He has a normal mood and affect. His behavior is normal. Judgment and thought content normal.      Assessment & Plan:   Olanda was seen today for emphysema.  Diagnoses and all orders for this visit:  Panlobular emphysema ([place])  Essential hypertension  Anxiety reaction  Other orders -     Fluticasone-Umeclidin-Vilant (TRELEGY ELLIPTA) 100-62.5-25 MCG/INH AEPB; Inhale 1 puff into the lungs daily. -     Ipratropium-Albuterol (COMBIVENT RESPIMAT) 20-100 MCG/ACT AERS respimat; Inhale 2 puffs into the lungs every 6 (six) hours. -     amLODipine (NORVASC) 5 MG tablet; Take 1 tablet (5 mg total) by mouth daily.       I have discontinued Marcello Moores C. Awbrey's umeclidinium-vilanterol, albuterol, and PROAIR HFA. I have also changed his Ipratropium-Albuterol. Additionally, I am having him start on Fluticasone-Umeclidin-Vilant. Lastly, I am having him maintain his fluticasone, cholestyramine, Iron-Vitamins (GERITOL PO), mirtazapine, triamcinolone 0.1 % cream : eucerin, mirabegron ER, and amLODipine.  Allergies as of 09/10/2018      Reactions   Tiotropium Bromide Monohydrate Other (See Comments)   Severe urinary retention   Varenicline Other (See Comments)    Psychiatric and dreams   Iodinated Diagnostic Agents    Statins       Medication List        Accurate as of 09/10/18  1:47 PM. Always use your most recent med list.          amLODipine 5 MG tablet Commonly known as:  NORVASC Take 1 tablet (5 mg total) by mouth daily.   cholestyramine 4 g packet Commonly known as:  QUESTRAN   fluticasone 50 MCG/ACT nasal spray Commonly known as:  FLONASE Place 1 spray into both nostrils daily.   Fluticasone-Umeclidin-Vilant 100-62.5-25 MCG/INH Aepb Inhale 1 puff into the lungs daily.   GERITOL PO Take by mouth.   Ipratropium-Albuterol 20-100 MCG/ACT Aers respimat Commonly known as:  COMBIVENT Inhale 2 puffs into the lungs every 6 (six) hours.   mirabegron ER 25 MG Tb24 tablet Commonly known as:  MYRBETRIQ Take 1 tablet (  25 mg total) by mouth daily.   mirtazapine 7.5 MG tablet Commonly known as:  REMERON Take 1 tablet (7.5 mg total) by mouth at bedtime.   triamcinolone 0.1 % cream : eucerin Crea Apply 1 application topically daily.      Patient will let me know if and when he decides that he needs some treatment for the anxiety.  He is going to limit his inhalers to the 2 mentioned above the Trelegy and the Combivent Respimat.  He is going to resume his amlodipine.  We made a verbal contract on that today.  Follow-up: Return in about 3 months (around 12/10/2018).  [name], M.D.

## 2018-09-25 ENCOUNTER — Other Ambulatory Visit: Payer: Self-pay | Admitting: *Deleted

## 2018-09-25 MED ORDER — FLUTICASONE-UMECLIDIN-VILANT 100-62.5-25 MCG/INH IN AEPB: 1.0000 | INHALATION_SPRAY | Freq: Every day | RESPIRATORY_TRACT | 11 refills | Status: DC

## 2018-10-15 ENCOUNTER — Encounter: Payer: Self-pay | Admitting: Family Medicine

## 2018-10-15 ENCOUNTER — Ambulatory Visit (INDEPENDENT_AMBULATORY_CARE_PROVIDER_SITE_OTHER): Payer: Medicare Other | Admitting: Family Medicine

## 2018-10-15 VITALS — BP 129/62 | HR 90 | Temp 98.0°F | Ht 67.5 in | Wt 143.0 lb

## 2018-10-15 DIAGNOSIS — I10 Essential (primary) hypertension: Secondary | ICD-10-CM

## 2018-10-15 DIAGNOSIS — E782 Mixed hyperlipidemia: Secondary | ICD-10-CM

## 2018-10-15 DIAGNOSIS — J431 Panlobular emphysema: Secondary | ICD-10-CM

## 2018-10-15 MED ORDER — ALBUTEROL SULFATE HFA 108 (90 BASE) MCG/ACT IN AERS: 1.0000 | INHALATION_SPRAY | Freq: Four times a day (QID) | RESPIRATORY_TRACT | 3 refills | Status: DC | PRN

## 2018-10-15 MED ORDER — FLUTICASONE-UMECLIDIN-VILANT 100-62.5-25 MCG/INH IN AEPB: 1.0000 | INHALATION_SPRAY | Freq: Every day | RESPIRATORY_TRACT | 5 refills | Status: DC

## 2018-10-15 MED ORDER — IPRATROPIUM-ALBUTEROL 20-100 MCG/ACT IN AERS: 2.0000 | INHALATION_SPRAY | Freq: Four times a day (QID) | RESPIRATORY_TRACT | 5 refills | Status: DC

## 2018-10-15 MED ORDER — FLUTICASONE PROPIONATE 50 MCG/ACT NA SUSP: 1.0000 | Freq: Every day | NASAL | 5 refills | Status: DC

## 2018-10-15 MED ORDER — MIRABEGRON ER 25 MG PO TB24: 25.0000 mg | ORAL_TABLET | Freq: Every day | ORAL | 5 refills | Status: DC

## 2018-10-15 MED ORDER — AMLODIPINE BESYLATE 5 MG PO TABS: 5.0000 mg | ORAL_TABLET | Freq: Every day | ORAL | 5 refills | Status: DC

## 2018-10-15 NOTE — Progress Notes (Signed)
Subjective:  Patient ID: Joshua Whitaker, male    DOB: 11-02-1941  Age: 77 y.o. MRN: [id number]  CC: Medical Management of Chronic Issues   HPI Joshua Whitaker presents for  follow-up of hypertension. Patient has no history of headache chest pain or shortness of breath or recent cough. Patient also denies symptoms of TIA such as focal numbness or weakness. Patient denies side effects from medication. States taking it regularly since discussion last month.  Also seen today for emphysema. Some DOE controlled with inhalers since change to trelegy last month. Still doesn't desire treatment for anxiety.  History Joshua Whitaker has a past medical history of Allergy, Anxiety, Asthma, Cancer ([place]) (2007), COPD (chronic obstructive pulmonary disease) ([place]), Depression, and Prostate cancer ([place]).   He has a past surgical history that includes Eye surgery; Prostate surgery; and Spine surgery.   His family history includes Alcohol abuse in his daughter; Arthritis in his mother; Cancer in his brother, brother, brother, sister, sister, and sister; Chromosomal disorder in his sister; Diabetes in his sister; Heart disease in his father and mother; Rheum arthritis in his daughter and daughter; Stroke in his brother.He reports that he has been smoking cigarettes. He has been smoking about 1.00 pack per day. He has never used smokeless tobacco. He reports that he drinks alcohol. He reports that he does not use drugs.  Current Outpatient Medications on File Prior to Visit  Medication Sig Dispense Refill  . cholestyramine (QUESTRAN) 4 g packet     . Iron-Vitamins (GERITOL PO) Take by mouth.    . mirtazapine (REMERON) 7.5 MG tablet Take 1 tablet (7.5 mg total) by mouth at bedtime. 30 tablet 1  . Triamcinolone Acetonide (TRIAMCINOLONE 0.1 % CREAM : EUCERIN) CREA Apply 1 application topically daily. (Patient not taking: Reported on 10/15/2018) 1 each 1   No current facility-administered medications on file prior to  visit.     ROS Review of Systems  Constitutional: Negative for fever.  Respiratory: Positive for cough (chronic, at baseline) and shortness of breath. Negative for wheezing.   Cardiovascular: Negative for chest pain.  Endocrine: Positive for heat intolerance.  Musculoskeletal: Negative for arthralgias.  Skin: Negative for rash.    Objective:  BP 129/62   Pulse 90   Temp 98 F (36.7 C) (Oral)   Ht 5' 7.5" (1.715 m)   Wt 143 lb (64.9 kg)   BMI 22.07 kg/m   BP Readings from Last 3 Encounters:  10/15/18 129/62  09/10/18 (!) 175/69  07/08/18 (!) 124/58    Wt Readings from Last 3 Encounters:  10/15/18 143 lb (64.9 kg)  09/10/18 144 lb 6 oz (65.5 kg)  07/08/18 144 lb 3.2 oz (65.4 kg)     Physical Exam  Constitutional: He appears well-developed and well-nourished.  HENT:  Head: Normocephalic and atraumatic.  Right Ear: Tympanic membrane and external ear normal. No decreased hearing is noted.  Left Ear: Tympanic membrane and external ear normal. No decreased hearing is noted.  Mouth/Throat: No oropharyngeal exudate or posterior oropharyngeal erythema.  Eyes: Pupils are equal, round, and reactive to light.  Neck: Normal range of motion. Neck supple.  Cardiovascular: Normal rate and regular rhythm.  No murmur heard. Pulmonary/Chest: Effort normal. No respiratory distress. He has wheezes (occasional wheezes, scattered. Breath sounds are diminshed throughout).  Abdominal: Soft. Bowel sounds are normal. He exhibits no mass. There is no tenderness.  Skin: Skin is warm and dry.  Vitals reviewed.     Assessment & Plan:  Joshua Whitaker was seen today for medical management of chronic issues.  Diagnoses and all orders for this visit:  Essential hypertension -     CBC with Differential/Platelet -     CMP14+EGFR -     Lipid panel -     Cancel: Urinalysis  Mixed hyperlipidemia -     CBC with Differential/Platelet -     CMP14+EGFR -     Lipid panel -     Cancel:  Urinalysis  Panlobular emphysema (HCC) -     CBC with Differential/Platelet -     CMP14+EGFR -     Lipid panel -     Cancel: Urinalysis  Other orders -     albuterol (PROVENTIL HFA;VENTOLIN HFA) 108 (90 Base) MCG/ACT inhaler; Inhale 1-2 puffs into the lungs every 6 (six) hours as needed for wheezing or shortness of breath. -     mirabegron ER (MYRBETRIQ) 25 MG TB24 tablet; Take 1 tablet (25 mg total) by mouth daily. -     Fluticasone-Umeclidin-Vilant (TRELEGY ELLIPTA) 100-62.5-25 MCG/INH AEPB; Inhale 1 puff into the lungs daily. -     Ipratropium-Albuterol (COMBIVENT RESPIMAT) 20-100 MCG/ACT AERS respimat; Inhale 2 puffs into the lungs every 6 (six) hours. -     fluticasone (FLONASE) 50 MCG/ACT nasal spray; Place 1 spray into both nostrils daily. -     amLODipine (NORVASC) 5 MG tablet; Take 1 tablet (5 mg total) by mouth daily.   Allergies as of 10/15/2018      Reactions   Tiotropium Bromide Monohydrate Other (See Comments)   Severe urinary retention   Varenicline Other (See Comments)   Psychiatric and dreams   Iodinated Diagnostic Agents    Statins       Medication List        Accurate as of 10/15/18 11:59 PM. Always use your most recent med list.          albuterol 108 (90 Base) MCG/ACT inhaler Commonly known as:  PROVENTIL HFA;VENTOLIN HFA Inhale 1-2 puffs into the lungs every 6 (six) hours as needed for wheezing or shortness of breath.   amLODipine 5 MG tablet Commonly known as:  NORVASC Take 1 tablet (5 mg total) by mouth daily.   cholestyramine 4 g packet Commonly known as:  QUESTRAN   fluticasone 50 MCG/ACT nasal spray Commonly known as:  FLONASE Place 1 spray into both nostrils daily.   Fluticasone-Umeclidin-Vilant 100-62.5-25 MCG/INH Aepb Inhale 1 puff into the lungs daily.   GERITOL PO Take by mouth.   Ipratropium-Albuterol 20-100 MCG/ACT Aers respimat Commonly known as:  COMBIVENT Inhale 2 puffs into the lungs every 6 (six) hours.   mirabegron ER  25 MG Tb24 tablet Commonly known as:  MYRBETRIQ Take 1 tablet (25 mg total) by mouth daily.   mirtazapine 7.5 MG tablet Commonly known as:  REMERON Take 1 tablet (7.5 mg total) by mouth at bedtime.   triamcinolone 0.1 % cream : eucerin Crea Apply 1 application topically daily.       Meds ordered this encounter  Medications  . albuterol (PROVENTIL HFA;VENTOLIN HFA) 108 (90 Base) MCG/ACT inhaler    Sig: Inhale 1-2 puffs into the lungs every 6 (six) hours as needed for wheezing or shortness of breath.    Dispense:  18 g    Refill:  3  . mirabegron ER (MYRBETRIQ) 25 MG TB24 tablet    Sig: Take 1 tablet (25 mg total) by mouth daily.    Dispense:  30 tablet    Refill:  5  . Fluticasone-Umeclidin-Vilant (TRELEGY ELLIPTA) 100-62.5-25 MCG/INH AEPB    Sig: Inhale 1 puff into the lungs daily.    Dispense:  28 each    Refill:  5  . Ipratropium-Albuterol (COMBIVENT RESPIMAT) 20-100 MCG/ACT AERS respimat    Sig: Inhale 2 puffs into the lungs every 6 (six) hours.    Dispense:  1 Inhaler    Refill:  5  . fluticasone (FLONASE) 50 MCG/ACT nasal spray    Sig: Place 1 spray into both nostrils daily.    Dispense:  16 g    Refill:  5  . amLODipine (NORVASC) 5 MG tablet    Sig: Take 1 tablet (5 mg total) by mouth daily.    Dispense:  30 tablet    Refill:  5    Follow-up: Return in about 3 months (around 01/15/2019).  [name], M.D.

## 2018-10-16 LAB — CBC WITH DIFFERENTIAL/PLATELET
Basophils Absolute: 0.1 10*3/uL (ref 0.0–0.2)
Basos: 1 %
EOS (ABSOLUTE): 0.1 10*3/uL (ref 0.0–0.4)
Eos: 1 %
HEMATOCRIT: 42.9 % (ref 37.5–51.0)
Hemoglobin: 14.5 g/dL (ref 13.0–17.7)
Immature Grans (Abs): 0.1 10*3/uL (ref 0.0–0.1)
Immature Granulocytes: 1 %
LYMPHS ABS: 1.9 10*3/uL (ref 0.7–3.1)
Lymphs: 23 %
MCH: 30.9 pg (ref 26.6–33.0)
MCHC: 33.8 g/dL (ref 31.5–35.7)
MCV: 91 fL (ref 79–97)
MONOS ABS: 0.7 10*3/uL (ref 0.1–0.9)
Monocytes: 9 %
Neutrophils Absolute: 5.5 10*3/uL (ref 1.4–7.0)
Neutrophils: 65 %
PLATELETS: 289 10*3/uL (ref 150–450)
RBC: 4.7 x10E6/uL (ref 4.14–5.80)
RDW: 11.8 % — AB (ref 12.3–15.4)
WBC: 8.3 10*3/uL (ref 3.4–10.8)

## 2018-10-16 LAB — CMP14+EGFR
A/G RATIO: 1.8 (ref 1.2–2.2)
ALK PHOS: 56 IU/L (ref 39–117)
ALT: 9 IU/L (ref 0–44)
AST: 21 IU/L (ref 0–40)
Albumin: 4.2 g/dL (ref 3.5–4.8)
BUN/Creatinine Ratio: 11 (ref 10–24)
BUN: 9 mg/dL (ref 8–27)
Bilirubin Total: 0.3 mg/dL (ref 0.0–1.2)
CO2: 23 mmol/L (ref 20–29)
Calcium: 9 mg/dL (ref 8.6–10.2)
Chloride: 97 mmol/L (ref 96–106)
Creatinine, Ser: 0.79 mg/dL (ref 0.76–1.27)
GFR calc Af Amer: 102 mL/min/{1.73_m2} (ref >59)
GFR calc non Af Amer: 88 mL/min/{1.73_m2} (ref >59)
GLOBULIN, TOTAL: 2.3 g/dL (ref 1.5–4.5)
Glucose: 72 mg/dL (ref 65–99)
POTASSIUM: 4.4 mmol/L (ref 3.5–5.2)
SODIUM: 134 mmol/L (ref 134–144)
Total Protein: 6.5 g/dL (ref 6.0–8.5)

## 2018-10-16 LAB — LIPID PANEL
CHOLESTEROL TOTAL: 185 mg/dL (ref 100–199)
Chol/HDL Ratio: 2.6 ratio (ref 0.0–5.0)
HDL: 71 mg/dL (ref >39)
LDL Calculated: 98 mg/dL (ref 0–99)
TRIGLYCERIDES: 82 mg/dL (ref 0–149)
VLDL Cholesterol Cal: 16 mg/dL (ref 5–40)

## 2018-10-20 ENCOUNTER — Encounter: Payer: Self-pay | Admitting: Family Medicine

## 2018-10-20 NOTE — Progress Notes (Signed)
Hello Sarim,  Your lab result is normal.Some minor variations that are not significant are commonly marked abnormal, but do not represent any medical problem for you.  Best regards, Ikia Cincotta, M.D.

## 2018-12-29 ENCOUNTER — Telehealth: Payer: Self-pay | Admitting: Family Medicine

## 2018-12-29 NOTE — Telephone Encounter (Signed)
I can see him for anxiety, but I don't use xanax for that. If myrbetriq not helping bladder, DC and follow up here. WS

## 2018-12-29 NOTE — Telephone Encounter (Signed)
I explained to him that Mybetriq was for bladder and he said it doesn't work for that either. Says he sleeps ok, but has trouble with anxiety during day

## 2019-01-06 ENCOUNTER — Encounter: Payer: Self-pay | Admitting: Nurse Practitioner

## 2019-01-06 ENCOUNTER — Ambulatory Visit (INDEPENDENT_AMBULATORY_CARE_PROVIDER_SITE_OTHER): Payer: Medicare Other | Admitting: Nurse Practitioner

## 2019-01-06 ENCOUNTER — Ambulatory Visit (INDEPENDENT_AMBULATORY_CARE_PROVIDER_SITE_OTHER): Payer: Medicare Other

## 2019-01-06 VITALS — BP 164/72 | HR 78 | Temp 97.0°F | Ht 67.0 in | Wt 139.0 lb

## 2019-01-06 DIAGNOSIS — R1310 Dysphagia, unspecified: Secondary | ICD-10-CM

## 2019-01-06 DIAGNOSIS — R0602 Shortness of breath: Secondary | ICD-10-CM

## 2019-01-06 DIAGNOSIS — R1319 Other dysphagia: Secondary | ICD-10-CM

## 2019-01-06 MED ORDER — PREDNISONE 10 MG (21) PO TBPK: ORAL_TABLET | ORAL | 0 refills | Status: DC

## 2019-01-06 NOTE — Patient Instructions (Signed)
Dysphagia Eating Plan, Bite Size Food °This diet plan is for people with moderate swallowing problems who have transitioned from pureed and minced foods. Bite size foods are soft and cut into small chunks so that they can be swallowed safely. On this eating plan, you may be instructed to drink liquids that are thickened. °Work with your health care provider and your diet and nutrition specialist (dietitian) to make sure that you are following the diet safely and getting all the nutrients you need. °What are tips for following this plan? °General guidelines for foods ° °· You may eat foods that are tender, soft, and moist. °· Always test food texture before taking a bite. Poke food with a fork or spoon to make sure it is tender. °· Food should be easy to cut and shew. Avoid large pieces of food that require a lot of chewing. °· Take small bites. Each bite should be smaller than your thumb nail (about 15mm by 15 mm). °· If you were on pureed and minced food diet plans, you may eat any of the foods included in those diets. °· Avoid foods that are very dry, hard, sticky, chewy, coarse, or crunchy. °· If instructed by your health care provider, thicken liquids. Follow your health care provider's instructions for what products to use, how to do this, and to what thickness. °? Your health care provider may recommend using a commercial thickener, rice cereal, or potato flakes. Ask your health care provider to recommend thickeners. °? Thickened liquids are usually a “pudding-like” consistency, or they may be as thick as honey or thick enough to eat with a spoon. °Cooking °· To moisten foods, you may add liquids while you are blending, mashing, or grinding your foods to the right consistency. These liquids include gravies, sauces, vegetable or fruit juice, milk, half and half, or water. °· Strain extra liquid from foods before eating. °· Reheat foods slowly to prevent a tough crust from forming. °· Prepare foods in  advance. °Meal planning °· Eat a variety of foods to get all the nutrients you need. °· Some foods may be tolerated better than others. Work with your dietitian to identify which foods are safest for you to eat. °· Follow your meal plan as told by your dietitian. °What foods are allowed? °Grains °Moist breads without nuts or seeds. Biscuits, muffins, pancakes, and waffles that are well-moistened with syrup, jelly, margarine, or butter. Cooked cereals. Moist bread stuffing. Moist rice. Well-moistened cold cereal with small chunks. Well-cooked pasta, noodles, rice, and bread dressing in small pieces and thick sauce. Soft dumplings or spaetzle in small pieces and butter or gravy. °Vegetables °Soft, well-cooked vegetables in small pieces. Soft-cooked, mashed potatoes. Thickened vegetable juice. °Fruits °Canned or cooked fruits that are soft or moist and do not have skin or seeds. Fresh, soft bananas. Thickened fruit juices. °Meat and other protein foods °Tender, moist meats or poultry in small pieces. Moist meatballs or meatloaf. Fish without bones. Eggs or egg substitutes in small pieces. Tofu. Tempeh and meat alternatives in small pieces. Well-cooked, tender beans, peas, baked beans, and other legumes. °Dairy °Thickened milk. Cream cheese. Yogurt. Cottage cheese. Sour cream. Small pieces of soft cheese. °Fats and oils °Butter. Oils. Margarine. Mayonnaise. Gravy. Spreads. °Sweets and desserts °Soft, smooth, moist desserts. Pudding. Custard. Moist cakes. Jam. Jelly. Honey. Preserves. Ask your health care provider whether you can have frozen desserts. °Seasoning and other foods °All seasonings and sweeteners. All sauces with small chunks. Prepared tuna, egg, or chicken   salad without raw fruits or vegetables. Moist casseroles with small, tender pieces of meat. Soups with tender meat. °What foods are not allowed? °Grains °Coarse or dry cereals. Dry breads. Toast. Crackers. Tough, crusty breads, such as French bread and  baguettes. Dry pancakes, waffles, and muffins. Sticky rice. Dry bread stuffing. Granola. Popcorn. Chips. °Vegetables °All raw vegetables. Cooked corn. Rubbery or stiff cooked vegetables. Stringy vegetables, such as celery. Tough, crisp fried potatoes. Potato skins. °Fruits °Hard, crunchy, stringy, high-pulp, and juicy raw fruits such as apples, pineapple, papaya, and watermelon. Small, round fruits, such as grapes. Dried fruit and fruit leather. °Meat and other protein foods °Large pieces of meat. Dry, tough meats, such as bacon, sausage, and hot dogs. Chicken, turkey, or fish with skin and bones. Crunchy peanut butter. Nuts. Seeds. Nut and seed butters. °Dairy °Yogurt with nuts, seeds, or large chunks. Large chunks of cheese. Frozen desserts and milk consistency not allowed by your dietitian. °Sweets and desserts °Dry cakes. Chewy or dry cookies. Any desserts with nuts, seeds, dry fruits, coconut, pineapple, or anything dry, sticky, or hard. Chewy caramel. Licorice. Taffy-type candies. Ask your health care provider whether you can have frozen desserts. °Seasoning and other foods °Soups with tough or large chunks of meats, poultry, or vegetables. Corn or clam chowder. Smoothies with large chunks of fruit. °Summary °· Bite size foods can be helpful for people with moderate swallowing problems. °· On the dysphagia eating plan, you may eat foods that are soft, moist, and cut into pieces smaller than 15mm by 15mm. °· You may be instructed to thicken liquids. Follow your health care provider's instructions about how to do this and to what consistency. °This information is not intended to replace advice given to you by your health care provider. Make sure you discuss any questions you have with your health care provider. °Document Released: 12/10/2005 Document Revised: 03/22/2017 Document Reviewed: 03/22/2017 °Elsevier Interactive Patient Education © 2019 [employer] ° °

## 2019-01-06 NOTE — Progress Notes (Signed)
   Subjective:    Patient ID: Joshua Whitaker, male    DOB: 04-16-1941, 78 y.o.   MRN: [id number]   Chief Complaint: Cough; Nasal Congestion; and Shortness of Breath   HPI Patient comes in today c/o sob and congestion. Started about 2 weeks ago and has gotten worse. He has been having this issue for over a year.he has a dx of panlobular emphysema and is on trelegy and combivent. He use ti see DR. Hawkins. Patient smokes over a  pack a day. * he also says he is having trouble swallowing.  Review of Systems  Constitutional: Negative for chills and fever.  HENT: Positive for congestion. Negative for rhinorrhea, sinus pain and sore throat.   Respiratory: Positive for cough and shortness of breath.   Cardiovascular: Negative.   Gastrointestinal: Negative.   Neurological: Negative.   Psychiatric/Behavioral: Negative.   All other systems reviewed and are negative.      Objective:   Physical Exam Constitutional:      General: He is not in acute distress.    Appearance: He is well-developed and normal weight.  Neck:     Musculoskeletal: Normal range of motion.  Cardiovascular:     Rate and Rhythm: Normal rate and regular rhythm.  Pulmonary:     Breath sounds: Examination of the right-upper field reveals decreased breath sounds and wheezing. Examination of the left-upper field reveals decreased breath sounds and wheezing. Examination of the right-middle field reveals decreased breath sounds and wheezing. Examination of the left-middle field reveals decreased breath sounds and wheezing. Examination of the right-lower field reveals decreased breath sounds and wheezing. Examination of the left-lower field reveals decreased breath sounds and wheezing. Decreased breath sounds and wheezing present.  Skin:    General: Skin is warm.  Neurological:     General: No focal deficit present.  Psychiatric:        Mood and Affect: Mood normal.     BP (!) 164/72   Pulse 78   Temp (!) 97 F (36.1  C) (Oral)   Ht 5\' 7"  (1.702 m)   Wt 139 lb (63 kg)   SpO2 95%   BMI 21.77 kg/m    Chest xray - unchanged from previous-Preliminary reading by Ronnald Collum, FNP  [facility]\    Assessment & Plan:  [name] in today with chief complaint of Cough; Nasal Congestion; and Shortness of Breath   1. SOB (shortness of breath) Smoking cessation ncouraed - DG Chest 2 View; Future - predniSONE (STERAPRED UNI-PAK 21 TAB) 10 MG (21) TBPK tablet; As directed x 6 days  Dispense: 21 tablet; Refill: 0 - Ambulatory referral to Pulmonology  2. Esophageal dysphagia Chew food well - Ambulatory referral to Gastroenterology  Mary-Margaret Hassell Done, FNP

## 2019-01-07 NOTE — Telephone Encounter (Signed)
Pt was seen in office yesterday, will close encounter.

## 2019-01-24 ENCOUNTER — Other Ambulatory Visit: Payer: Self-pay | Admitting: Family Medicine

## 2019-01-28 DIAGNOSIS — I10 Essential (primary) hypertension: Secondary | ICD-10-CM | POA: Diagnosis not present

## 2019-01-28 DIAGNOSIS — J301 Allergic rhinitis due to pollen: Secondary | ICD-10-CM | POA: Diagnosis not present

## 2019-01-28 DIAGNOSIS — J449 Chronic obstructive pulmonary disease, unspecified: Secondary | ICD-10-CM | POA: Diagnosis not present

## 2019-01-30 ENCOUNTER — Other Ambulatory Visit: Payer: Self-pay | Admitting: Family Medicine

## 2019-02-04 DIAGNOSIS — J449 Chronic obstructive pulmonary disease, unspecified: Secondary | ICD-10-CM | POA: Diagnosis not present

## 2019-03-05 DIAGNOSIS — J449 Chronic obstructive pulmonary disease, unspecified: Secondary | ICD-10-CM | POA: Diagnosis not present

## 2019-03-10 ENCOUNTER — Encounter: Payer: Self-pay | Admitting: Family Medicine

## 2019-03-10 ENCOUNTER — Other Ambulatory Visit: Payer: Self-pay | Admitting: Family Medicine

## 2019-03-10 MED ORDER — AZITHROMYCIN 250 MG PO TABS: ORAL_TABLET | ORAL | 0 refills | Status: DC

## 2019-03-13 ENCOUNTER — Other Ambulatory Visit: Payer: Self-pay

## 2019-03-13 ENCOUNTER — Emergency Department (HOSPITAL_COMMUNITY): Payer: Medicare Other

## 2019-03-13 ENCOUNTER — Inpatient Hospital Stay (HOSPITAL_COMMUNITY)
Admission: EM | Admit: 2019-03-13 | Discharge: 2019-03-16 | DRG: 190 | Disposition: A | Discharge status: Home / Self Care | Payer: Medicare Other | Attending: Internal Medicine | Admitting: Internal Medicine

## 2019-03-13 DIAGNOSIS — Z7982 Long term (current) use of aspirin: Secondary | ICD-10-CM

## 2019-03-13 DIAGNOSIS — Z8546 Personal history of malignant neoplasm of prostate: Secondary | ICD-10-CM

## 2019-03-13 DIAGNOSIS — Z8249 Family history of ischemic heart disease and other diseases of the circulatory system: Secondary | ICD-10-CM

## 2019-03-13 DIAGNOSIS — Z7952 Long term (current) use of systemic steroids: Secondary | ICD-10-CM | POA: Diagnosis not present

## 2019-03-13 DIAGNOSIS — I214 Non-ST elevation (NSTEMI) myocardial infarction: Secondary | ICD-10-CM | POA: Diagnosis not present

## 2019-03-13 DIAGNOSIS — J9622 Acute and chronic respiratory failure with hypercapnia: Secondary | ICD-10-CM | POA: Diagnosis not present

## 2019-03-13 DIAGNOSIS — Z9981 Dependence on supplemental oxygen: Secondary | ICD-10-CM

## 2019-03-13 DIAGNOSIS — F17210 Nicotine dependence, cigarettes, uncomplicated: Secondary | ICD-10-CM | POA: Diagnosis present

## 2019-03-13 DIAGNOSIS — I2584 Coronary atherosclerosis due to calcified coronary lesion: Secondary | ICD-10-CM | POA: Diagnosis not present

## 2019-03-13 DIAGNOSIS — I10 Essential (primary) hypertension: Secondary | ICD-10-CM | POA: Diagnosis not present

## 2019-03-13 DIAGNOSIS — Z888 Allergy status to other drugs, medicaments and biological substances status: Secondary | ICD-10-CM

## 2019-03-13 DIAGNOSIS — I2510 Atherosclerotic heart disease of native coronary artery without angina pectoris: Secondary | ICD-10-CM | POA: Diagnosis present

## 2019-03-13 DIAGNOSIS — Z811 Family history of alcohol abuse and dependence: Secondary | ICD-10-CM

## 2019-03-13 DIAGNOSIS — J441 Chronic obstructive pulmonary disease with (acute) exacerbation: Secondary | ICD-10-CM | POA: Diagnosis not present

## 2019-03-13 DIAGNOSIS — I213 ST elevation (STEMI) myocardial infarction of unspecified site: Secondary | ICD-10-CM | POA: Diagnosis not present

## 2019-03-13 DIAGNOSIS — R197 Diarrhea, unspecified: Secondary | ICD-10-CM | POA: Diagnosis present

## 2019-03-13 DIAGNOSIS — Z720 Tobacco use: Secondary | ICD-10-CM

## 2019-03-13 DIAGNOSIS — R7401 Elevation of levels of liver transaminase levels: Secondary | ICD-10-CM | POA: Diagnosis present

## 2019-03-13 DIAGNOSIS — Z7951 Long term (current) use of inhaled steroids: Secondary | ICD-10-CM

## 2019-03-13 DIAGNOSIS — Z66 Do not resuscitate: Secondary | ICD-10-CM | POA: Diagnosis present

## 2019-03-13 DIAGNOSIS — R7989 Other specified abnormal findings of blood chemistry: Secondary | ICD-10-CM | POA: Diagnosis not present

## 2019-03-13 DIAGNOSIS — R062 Wheezing: Secondary | ICD-10-CM | POA: Diagnosis not present

## 2019-03-13 DIAGNOSIS — Z809 Family history of malignant neoplasm, unspecified: Secondary | ICD-10-CM

## 2019-03-13 DIAGNOSIS — R9431 Abnormal electrocardiogram [ECG] [EKG]: Secondary | ICD-10-CM | POA: Diagnosis present

## 2019-03-13 DIAGNOSIS — I313 Pericardial effusion (noninflammatory): Secondary | ICD-10-CM | POA: Diagnosis not present

## 2019-03-13 DIAGNOSIS — R740 Nonspecific elevation of levels of transaminase and lactic acid dehydrogenase [LDH]: Secondary | ICD-10-CM

## 2019-03-13 DIAGNOSIS — E871 Hypo-osmolality and hyponatremia: Secondary | ICD-10-CM | POA: Diagnosis present

## 2019-03-13 DIAGNOSIS — E878 Other disorders of electrolyte and fluid balance, not elsewhere classified: Secondary | ICD-10-CM | POA: Diagnosis present

## 2019-03-13 DIAGNOSIS — E785 Hyperlipidemia, unspecified: Secondary | ICD-10-CM | POA: Diagnosis present

## 2019-03-13 DIAGNOSIS — F329 Major depressive disorder, single episode, unspecified: Secondary | ICD-10-CM | POA: Diagnosis present

## 2019-03-13 DIAGNOSIS — Z79899 Other long term (current) drug therapy: Secondary | ICD-10-CM | POA: Diagnosis not present

## 2019-03-13 DIAGNOSIS — Z91041 Radiographic dye allergy status: Secondary | ICD-10-CM | POA: Diagnosis not present

## 2019-03-13 DIAGNOSIS — R0600 Dyspnea, unspecified: Secondary | ICD-10-CM | POA: Diagnosis not present

## 2019-03-13 DIAGNOSIS — J431 Panlobular emphysema: Secondary | ICD-10-CM | POA: Diagnosis present

## 2019-03-13 DIAGNOSIS — I700 Atherosclerosis of aorta: Secondary | ICD-10-CM | POA: Diagnosis present

## 2019-03-13 DIAGNOSIS — Z9119 Patient's noncompliance with other medical treatment and regimen: Secondary | ICD-10-CM

## 2019-03-13 DIAGNOSIS — R0682 Tachypnea, not elsewhere classified: Secondary | ICD-10-CM | POA: Diagnosis not present

## 2019-03-13 DIAGNOSIS — J9621 Acute and chronic respiratory failure with hypoxia: Secondary | ICD-10-CM | POA: Diagnosis not present

## 2019-03-13 DIAGNOSIS — F1010 Alcohol abuse, uncomplicated: Secondary | ICD-10-CM | POA: Diagnosis present

## 2019-03-13 DIAGNOSIS — R05 Cough: Secondary | ICD-10-CM | POA: Diagnosis not present

## 2019-03-13 DIAGNOSIS — Z91199 Patient's noncompliance with other medical treatment and regimen due to unspecified reason: Secondary | ICD-10-CM

## 2019-03-13 DIAGNOSIS — F419 Anxiety disorder, unspecified: Secondary | ICD-10-CM | POA: Diagnosis present

## 2019-03-13 DIAGNOSIS — R0602 Shortness of breath: Secondary | ICD-10-CM | POA: Diagnosis not present

## 2019-03-13 DIAGNOSIS — J449 Chronic obstructive pulmonary disease, unspecified: Secondary | ICD-10-CM | POA: Diagnosis present

## 2019-03-13 DIAGNOSIS — L8990 Pressure ulcer of unspecified site, unspecified stage: Secondary | ICD-10-CM | POA: Diagnosis present

## 2019-03-13 LAB — COMPREHENSIVE METABOLIC PANEL
ALT: 41 U/L (ref 0–44)
AST: 39 U/L (ref 15–41)
Albumin: 2.8 g/dL — ABNORMAL LOW (ref 3.5–5.0)
Alkaline Phosphatase: 150 U/L — ABNORMAL HIGH (ref 38–126)
Anion gap: 12 (ref 5–15)
BUN: 14 mg/dL (ref 8–23)
CO2: 21 mmol/L — AB (ref 22–32)
Calcium: 7.7 mg/dL — ABNORMAL LOW (ref 8.9–10.3)
Chloride: 76 mmol/L — ABNORMAL LOW (ref 98–111)
Creatinine, Ser: 0.68 mg/dL (ref 0.61–1.24)
GFR calc Af Amer: >60 mL/min (ref >60)
GFR calc non Af Amer: >60 mL/min (ref >60)
Glucose, Bld: 133 mg/dL — ABNORMAL HIGH (ref 70–99)
Potassium: 4.8 mmol/L (ref 3.5–5.1)
SODIUM: 109 mmol/L — AB (ref 135–145)
Total Bilirubin: 0.7 mg/dL (ref 0.3–1.2)
Total Protein: 5.9 g/dL — ABNORMAL LOW (ref 6.5–8.1)

## 2019-03-13 LAB — I-STAT TROPONIN, ED: Troponin i, poc: 0.08 ng/mL (ref 0.00–0.08)

## 2019-03-13 LAB — CBC WITH DIFFERENTIAL/PLATELET
Abs Immature Granulocytes: 0.21 10*3/uL — ABNORMAL HIGH (ref 0.00–0.07)
BASOS PCT: 0 %
Basophils Absolute: 0 10*3/uL (ref 0.0–0.1)
Eosinophils Absolute: 0 10*3/uL (ref 0.0–0.5)
Eosinophils Relative: 0 %
HCT: 37.4 % — ABNORMAL LOW (ref 39.0–52.0)
Hemoglobin: 13.2 g/dL (ref 13.0–17.0)
Immature Granulocytes: 1 %
Lymphocytes Relative: 6 %
Lymphs Abs: 0.9 10*3/uL (ref 0.7–4.0)
MCH: 30.3 pg (ref 26.0–34.0)
MCHC: 35.3 g/dL (ref 30.0–36.0)
MCV: 85.8 fL (ref 80.0–100.0)
Monocytes Absolute: 1.5 10*3/uL — ABNORMAL HIGH (ref 0.1–1.0)
Monocytes Relative: 10 %
Neutro Abs: 12.1 10*3/uL — ABNORMAL HIGH (ref 1.7–7.7)
Neutrophils Relative %: 83 %
PLATELETS: 335 10*3/uL (ref 150–400)
RBC: 4.36 MIL/uL (ref 4.22–5.81)
RDW: 12.5 % (ref 11.5–15.5)
WBC: 14.7 10*3/uL — AB (ref 4.0–10.5)
nRBC: 0 % (ref 0.0–0.2)

## 2019-03-13 LAB — BRAIN NATRIURETIC PEPTIDE: B Natriuretic Peptide: 383.2 pg/mL — ABNORMAL HIGH (ref 0.0–100.0)

## 2019-03-13 LAB — TROPONIN I: TROPONIN I: 0.06 ng/mL — AB (ref <0.03)

## 2019-03-13 MED ORDER — SODIUM CHLORIDE 0.9 % IV SOLN
INTRAVENOUS | Status: DC
  Administered 2019-03-13 – 2019-03-14 (×2): via INTRAVENOUS

## 2019-03-13 MED ORDER — MAGNESIUM SULFATE 2 GM/50ML IV SOLN
2.0000 g | Freq: Once | INTRAVENOUS | Status: AC
  Administered 2019-03-13: 2 g via INTRAVENOUS
  Filled 2019-03-13: qty 50

## 2019-03-13 MED ORDER — NITROGLYCERIN 0.4 MG SL SUBL: 0.4000 mg | SUBLINGUAL_TABLET | SUBLINGUAL | Status: DC | PRN

## 2019-03-13 MED ORDER — SODIUM CHLORIDE 0.9 % IV SOLN
100.0000 mg | Freq: Once | INTRAVENOUS | Status: AC
  Administered 2019-03-14: 100 mg via INTRAVENOUS
  Filled 2019-03-13: qty 100

## 2019-03-13 MED ORDER — IPRATROPIUM BROMIDE 0.02 % IN SOLN
1.0000 mg | Freq: Once | RESPIRATORY_TRACT | Status: AC
  Administered 2019-03-14: 1 mg via RESPIRATORY_TRACT
  Filled 2019-03-13: qty 5

## 2019-03-13 MED ORDER — HEPARIN SODIUM (PORCINE) 5000 UNIT/ML IJ SOLN
60.0000 [IU]/kg | Freq: Once | INTRAMUSCULAR | Status: AC
  Administered 2019-03-13: 3800 [IU] via INTRAVENOUS

## 2019-03-13 MED ORDER — ALBUTEROL (5 MG/ML) CONTINUOUS INHALATION SOLN
10.0000 mg/h | INHALATION_SOLUTION | Freq: Once | RESPIRATORY_TRACT | Status: AC
  Administered 2019-03-14: 10 mg/h via RESPIRATORY_TRACT
  Filled 2019-03-13: qty 20

## 2019-03-13 MED ORDER — ASPIRIN 81 MG PO CHEW
324.0000 mg | CHEWABLE_TABLET | Freq: Once | ORAL | Status: AC
  Administered 2019-03-13: 324 mg via ORAL
  Filled 2019-03-13: qty 4

## 2019-03-13 NOTE — ED Notes (Signed)
Dr. Juliet Rude explained tests results , plan of care and admission plan to pt.

## 2019-03-13 NOTE — ED Provider Notes (Signed)
[facility] EMERGENCY DEPARTMENT Provider Note   CSN: [id number] Arrival date & time: 03/13/19  2227    History   Chief Complaint Chief Complaint  Patient presents with  . Shortness of Breath    HPI Joshua Whitaker is a 78 y.o. male.     HPI  78 year old male with past medical history as below here with cough and shortness of breath.  Patient in moderate distress on arrival, limiting history.  Per report, he has had progressive worsening generalized weakness for the last 3 weeks.  He has had cough and shortness of breath.  He has had loss of appetite and some weight loss as well.  Denies any overt chest pain.  Shortness of breath is worse with exertion and movement.  No alleviating factors.  No chest pain, nausea, or diaphoresis.  His daughter came to check on him today and convinced him to come here for evaluation.  Denies any other acute changes in his health.  He has a history of emphysema, follows with a PCP with [facility].  Denies known history of coronary disease.   Level 5 caveat invoked as remainder of history, ROS, and physical exam limited due to patient's AMS/resp distress.   Past Medical History:  Diagnosis Date  . Allergy   . Anxiety   . Asthma   . Cancer ([place]) 2007   prosate  . COPD (chronic obstructive pulmonary disease) ([place])   . Depression   . Prostate cancer [facility])     Patient Active Problem List   Diagnosis Date Noted  . STEMI (ST elevation myocardial infarction) ([place]) 03/14/2019  . Nodule of right lung 10/12/2015  . H/O prostate cancer 04/21/2014  . Essential hypertension 03/20/2011  . Hyperlipidemia 03/20/2011  . Panlobular emphysema ([place]) 03/20/2011    Past Surgical History:  Procedure Laterality Date  . EYE SURGERY    . PROSTATE SURGERY    . SPINE SURGERY          Home Medications    Prior to Admission medications   Medication Sig Start Date End Date Taking? Authorizing Provider  amLODipine (NORVASC) 5 MG  tablet Take 1 tablet (5 mg total) by mouth daily. Patient not taking: Reported on 01/06/2019 10/15/18   [name], MD  azithromycin [facility]) 250 MG tablet Take 2 tablets on day 1 and 1 tablet daily after that 03/10/19   [name], MD  cholestyramine Lucrezia Starch) 4 g packet  12/02/17   [provider]  fluticasone (FLONASE) 50 MCG/ACT nasal spray Place 1 spray into both nostrils daily. 10/15/18   [name], MD  Fluticasone-Umeclidin-Vilant (TRELEGY ELLIPTA) 100-62.5-25 MCG/INH AEPB Inhale 1 puff into the lungs daily. 10/15/18   [name], MD  Ipratropium-Albuterol (COMBIVENT RESPIMAT) 20-100 MCG/ACT AERS respimat Inhale 2 puffs into the lungs every 6 (six) hours. 10/15/18   [name], MD  predniSONE (STERAPRED UNI-PAK 21 TAB) 10 MG (21) TBPK tablet As directed x 6 days 01/06/19   Hassell Done Mary-Margaret, FNP  PROAIR HFA 108 [phone] Base) MCG/ACT inhaler Inhale 1-2 puffs into the lungs every 6 (six) hours as needed for wheezing or shortness of breath. 01/30/19   Hassell Done, Mary-Margaret, FNP  Triamcinolone Acetonide (TRIAMCINOLONE 0.1 % CREAM : EUCERIN) CREA Apply 1 application topically daily. Patient not taking: Reported on 10/15/2018 04/03/18   Timmothy Euler, MD    Family History Family History  Problem Relation Age of Onset  . Heart disease Mother   . Arthritis Mother   . Heart  disease Father   . Cancer Sister        kidney  . Cancer Brother        lung  . Cancer Brother        lung  . Stroke Brother   . Cancer Brother        prostate  . Cancer Sister        colon  . Cancer Sister        bone  . Diabetes Sister   . Chromosomal disorder Sister   . Alcohol abuse Daughter   . Rheum arthritis Daughter   . Rheum arthritis Daughter     Social History Social History   Tobacco Use  . Smoking status: Current Every Day Smoker    Packs/day: 1.00    Types: Cigarettes  . Smokeless tobacco: Never Used  Substance Use Topics  . Alcohol use: Yes    Comment:  occ  . Drug use: No     Allergies   Tiotropium bromide monohydrate; Varenicline; Iodinated diagnostic agents; and Statins   Review of Systems Review of Systems  Constitutional: Positive for fatigue. Negative for chills and fever.  HENT: Negative for congestion and rhinorrhea.   Eyes: Negative for visual disturbance.  Respiratory: Positive for cough, shortness of breath and wheezing.   Cardiovascular: Negative for chest pain and leg swelling.  Gastrointestinal: Negative for abdominal pain, diarrhea, nausea and vomiting.  Genitourinary: Negative for dysuria and flank pain.  Musculoskeletal: Negative for neck pain and neck stiffness.  Skin: Negative for rash and wound.  Allergic/Immunologic: Negative for immunocompromised state.  Neurological: Positive for syncope and weakness. Negative for headaches.  All other systems reviewed and are negative.    Physical Exam Updated Vital Signs BP 131/67   Pulse 85   Temp 98.2 F (36.8 C) (Oral)   Resp 14   Ht 5\' 9"  (1.753 m)   Wt 68 kg   SpO2 100%   BMI 22.15 kg/m   Physical Exam Vitals signs and nursing note reviewed.  Constitutional:      Comments: Cachectic, chronically ill-appearing  HENT:     Head: Normocephalic.     Comments: Dry mucous membranes.  Poor dentition.    Nose: Nose normal.     Mouth/Throat:     Mouth: Mucous membranes are dry.  Neck:     Musculoskeletal: Neck supple.  Cardiovascular:     Rate and Rhythm: Normal rate and regular rhythm.     Heart sounds: No murmur.  Pulmonary:     Comments: Tachypneic, with diminished aeration and diffuse inspiratory and expiratory wheezes.  Speaking in 1-2 word sentences. Abdominal:     Palpations: Abdomen is soft.  Skin:    General: Skin is warm.     Capillary Refill: Capillary refill takes 2 to 3 seconds.  Neurological:     General: No focal deficit present.     Comments: Drowsy, easily falls asleep.  No focal deficits.      ED Treatments / Results  Labs  (all labs ordered are listed, but only abnormal results are displayed) Labs Reviewed  CBC WITH DIFFERENTIAL/PLATELET - Abnormal; Notable for the following components:      Result Value   WBC 14.7 (*)    HCT 37.4 (*)    Neutro Abs 12.1 (*)    Monocytes Absolute 1.5 (*)    Abs Immature Granulocytes 0.21 (*)    All other components within normal limits  COMPREHENSIVE METABOLIC PANEL - Abnormal; Notable for the  following components:   Sodium 109 (*)    Chloride 76 (*)    CO2 21 (*)    Glucose, Bld 133 (*)    Calcium 7.7 (*)    Total Protein 5.9 (*)    Albumin 2.8 (*)    Alkaline Phosphatase 150 (*)    All other components within normal limits  BRAIN NATRIURETIC PEPTIDE - Abnormal; Notable for the following components:   B Natriuretic Peptide 383.2 (*)    All other components within normal limits  TROPONIN I - Abnormal; Notable for the following components:   Troponin I 0.06 (*)    All other components within normal limits  RESPIRATORY PANEL BY PCR  HEPARIN LEVEL (UNFRACTIONATED)  CBC  SODIUM, URINE, RANDOM  OSMOLALITY, URINE  OSMOLALITY  TSH  CORTISOL  CBC  TROPONIN I  TROPONIN I  TROPONIN I  BASIC METABOLIC PANEL  SODIUM  SODIUM  SODIUM  SODIUM  I-STAT TROPONIN, ED    EKG EKG Interpretation  Date/Time:  Friday March 13 2019 23:07:46 EDT Ventricular Rate:  97 PR Interval:    QRS Duration: 98 QT Interval:  326 QTC Calculation: 415 R Axis:   70 Text Interpretation:  Sinus rhythm Inferior infarct, acute (LCx) Minimal ST elevation, anterior leads >>> Acute MI <<< Confirmed by Duffy Bruce [phone]) on 03/13/2019 11:34:27 PM   Radiology Dg Chest Port 1 View  Result Date: 03/13/2019 CLINICAL DATA:  Cough and chronic shortness of breath worse today, dyspnea EXAM: PORTABLE CHEST 1 VIEW COMPARISON:  01/06/2019 FINDINGS: Mild enlargement of cardiac silhouette. Atherosclerotic calcification aorta. Mediastinal contours and pulmonary vascularity normal. Emphysematous and  mild bronchitic changes consistent with COPD. Scarring in LEFT upper lobe and minimally at LEFT base. No acute infiltrate, pleural effusion or pneumothorax. Bones demineralized. IMPRESSION: COPD changes without acute infiltrate. Mild enlargement of cardiac silhouette. Electronically Signed   By: Lavonia Dana M.D.   On: 03/13/2019 23:17    Procedures .Critical Care Performed by: Duffy Bruce, MD Authorized by: Duffy Bruce, MD   Critical care provider statement:    Critical care time (minutes):  75   Critical care time was exclusive of:  Separately billable procedures and treating other patients and teaching time   Critical care was necessary to treat or prevent imminent or life-threatening deterioration of the following conditions:  Circulatory failure, cardiac failure and metabolic crisis   Critical care was time spent personally by me on the following activities:  Development of treatment plan with patient or surrogate, discussions with consultants, evaluation of patient's response to treatment, examination of patient, obtaining history from patient or surrogate, ordering and performing treatments and interventions, ordering and review of laboratory studies, ordering and review of radiographic studies, pulse oximetry, re-evaluation of patient's condition and review of old charts   I assumed direction of critical care for this patient from another provider in my specialty: no     (including critical care time)  Medications Ordered in ED Medications  0.9 %  sodium chloride infusion ( Intravenous New Bag/Given 03/13/19 2339)  nitroGLYCERIN (NITROSTAT) SL tablet 0.4 mg (has no administration in time range)  doxycycline (VIBRAMYCIN) 100 mg in sodium chloride 0.9 % 250 mL IVPB (100 mg Intravenous New Bag/Given 03/14/19 0024)  heparin ADULT infusion 100 units/mL (25000 units/245mL sodium chloride 0.45%) (800 Units/hr Intravenous New Bag/Given 03/14/19 0024)  predniSONE (DELTASONE) tablet 40 mg  (has no administration in time range)  arformoterol (BROVANA) nebulizer solution 15 mcg (has no administration in time range)  budesonide (  PULMICORT) nebulizer solution 0.25 mg (has no administration in time range)  ipratropium-albuterol (DUONEB) 0.5-2.5 (3) MG/3ML nebulizer solution 3 mL (has no administration in time range)  azithromycin (ZITHROMAX) tablet 500 mg (has no administration in time range)  acetaminophen (TYLENOL) tablet 650 mg (has no administration in time range)  ondansetron (ZOFRAN) injection 4 mg (has no administration in time range)  aspirin chewable tablet 81 mg (has no administration in time range)  clopidogrel (PLAVIX) tablet 75 mg (has no administration in time range)  LORazepam (ATIVAN) injection 1-2 mg (has no administration in time range)  thiamine (VITAMIN B-1) tablet 100 mg (has no administration in time range)  folic acid (FOLVITE) tablet 1 mg (has no administration in time range)  multivitamin with minerals tablet 1 tablet (has no administration in time range)  aspirin chewable tablet 324 mg (324 mg Oral Given 03/13/19 2339)  heparin injection 3,800 Units (3,800 Units Intravenous Given 03/13/19 2340)  albuterol (PROVENTIL,VENTOLIN) solution continuous neb (10 mg/hr Nebulization Given 03/14/19 0007)  magnesium sulfate IVPB 2 g 50 mL (0 g Intravenous Stopped 03/14/19 0022)  ipratropium (ATROVENT) nebulizer solution 1 mg (1 mg Nebulization Given 03/14/19 0007)  sodium chloride 0.9 % bolus 250 mL (0 mLs Intravenous Stopped 03/14/19 0120)     Initial Impression / Assessment and Plan / ED Course  I have reviewed the triage vital signs and the nursing notes.  Pertinent labs & imaging results that were available during my care of the patient were reviewed by me and considered in my medical decision making (see chart for details).       78 year old male here with generalized weakness and shortness of breath. Suspect acute resp failure 2/2 COPD/emphysema. No focal PNA on  CXR. No PTX. No COVID risk factors and no fever.   Of note, pt also with ST elevation in inferior leads. Trop elevated. On arrival, EKG reviewed by Dr. Jeanell Sparrow, repeat shows no dynamic changes but I immediately discussed with Dr. Ellyn Hack. Initially, after conversation and consideration of pt presentation, decision was made to possibly take pt to cath lab; however, due to resp distress and pt's DNR status, feel risks of sedation/cath outweigh benefits and will tx medically per Cards. I confirmed with pt that he is DNI.  Pt also with profound hyponatremia. Unclear etiology. He's mildly hypovolemic clinically but query SIADH form pulmonary source as well. Will admit to ICU.  Final Clinical Impressions(s) / ED Diagnoses   Final diagnoses:  COPD exacerbation ([place])  Hyponatremia  NSTEMI (non-ST elevated myocardial infarction) [facility])    ED Discharge Orders    None       Duffy Bruce, MD 03/14/19 0206

## 2019-03-13 NOTE — ED Notes (Signed)
Called carelink to activate code STEMI

## 2019-03-13 NOTE — Progress Notes (Signed)
ANTICOAGULATION CONSULT NOTE - Initial Consult  Pharmacy Consult for heparin Indication: chest pain/ACS  Allergies  Allergen Reactions  . Tiotropium Bromide Monohydrate Other (See Comments)    Severe urinary retention  . Varenicline Other (See Comments)    Psychiatric and dreams  . Iodinated Diagnostic Agents   . Statins     Patient Measurements: Height: 5\' 9"  (175.3 cm) Weight: 150 lb (68 kg) IBW/kg (Calculated) : 70.7  Vital Signs: Temp: 98.2 F (36.8 C) (03/20 2231) Temp Source: Oral (03/20 2231) BP: 158/69 (03/20 2345) Pulse Rate: 95 (03/20 2345)  Labs: Recent Labs    03/13/19 2304  HGB 13.2  HCT 37.4*  PLT 335   Medical History: Past Medical History:  Diagnosis Date  . Allergy   . Anxiety   . Asthma   . Cancer ([place]) 2007   prosate  . COPD (chronic obstructive pulmonary disease) ([place])   . Depression   . Prostate cancer [facility])     Assessment: 78yo male called EMS for SOB, found to be diaphoretic w/ labored breathing, denied CP but EKG showed STEMI, cards canceled code on arrival to ED, now to begin heparin.  Goal of Therapy:  Heparin level 0.3-0.7 units/ml Monitor platelets by anticoagulation protocol: Yes   Plan:  Rec'd heparin bolus of 3800 units in ED; will begin heparin gtt at 800 units/hr and monitor heparin levels and CBC.  Wynona Neat, PharmD, BCPS  03/13/2019,11:54 PM

## 2019-03-13 NOTE — ED Triage Notes (Signed)
Pt arrived via [place] from home where pt where family  Called EMS out for SOB. Pt has history of recurring lung infections and COPD. Pt is a current smoker. EMS tx PTA: 2mg  IV mag, 125mg  solumedrol, 3x duoneb without improvement. Pt presented diaphoretic and labored breathing. Sp02 100% on empty nebulizer mask upon arrival. No distress noted. Pt denied chest pain at time of arrival. EDP notified of pt presentation; reporting to bedside immediately. EMS EKG reported STEMI however it failed to sent to [facility]. ED EKG reported STEMI, EDP notified. Second line started upon arrival to ED. Pt was able to verbalize concerns and was alert and oriented at time of triage.

## 2019-03-13 NOTE — ED Notes (Signed)
Paged stemi Dr Ellyn Hack to Dr Ellender Hose

## 2019-03-14 ENCOUNTER — Inpatient Hospital Stay (HOSPITAL_COMMUNITY): Payer: Medicare Other

## 2019-03-14 DIAGNOSIS — R740 Nonspecific elevation of levels of transaminase and lactic acid dehydrogenase [LDH]: Secondary | ICD-10-CM

## 2019-03-14 DIAGNOSIS — E871 Hypo-osmolality and hyponatremia: Secondary | ICD-10-CM | POA: Diagnosis present

## 2019-03-14 DIAGNOSIS — J441 Chronic obstructive pulmonary disease with (acute) exacerbation: Secondary | ICD-10-CM | POA: Diagnosis not present

## 2019-03-14 DIAGNOSIS — Z9981 Dependence on supplemental oxygen: Secondary | ICD-10-CM | POA: Diagnosis not present

## 2019-03-14 DIAGNOSIS — Z91041 Radiographic dye allergy status: Secondary | ICD-10-CM | POA: Diagnosis not present

## 2019-03-14 DIAGNOSIS — I213 ST elevation (STEMI) myocardial infarction of unspecified site: Secondary | ICD-10-CM | POA: Diagnosis present

## 2019-03-14 DIAGNOSIS — F419 Anxiety disorder, unspecified: Secondary | ICD-10-CM | POA: Diagnosis present

## 2019-03-14 DIAGNOSIS — I313 Pericardial effusion (noninflammatory): Secondary | ICD-10-CM | POA: Diagnosis present

## 2019-03-14 DIAGNOSIS — I700 Atherosclerosis of aorta: Secondary | ICD-10-CM | POA: Diagnosis not present

## 2019-03-14 DIAGNOSIS — R9431 Abnormal electrocardiogram [ECG] [EKG]: Secondary | ICD-10-CM | POA: Diagnosis not present

## 2019-03-14 DIAGNOSIS — J449 Chronic obstructive pulmonary disease, unspecified: Secondary | ICD-10-CM | POA: Diagnosis present

## 2019-03-14 DIAGNOSIS — E878 Other disorders of electrolyte and fluid balance, not elsewhere classified: Secondary | ICD-10-CM | POA: Diagnosis present

## 2019-03-14 DIAGNOSIS — E785 Hyperlipidemia, unspecified: Secondary | ICD-10-CM | POA: Diagnosis present

## 2019-03-14 DIAGNOSIS — Z7952 Long term (current) use of systemic steroids: Secondary | ICD-10-CM | POA: Diagnosis not present

## 2019-03-14 DIAGNOSIS — R197 Diarrhea, unspecified: Secondary | ICD-10-CM | POA: Diagnosis present

## 2019-03-14 DIAGNOSIS — I2584 Coronary atherosclerosis due to calcified coronary lesion: Secondary | ICD-10-CM

## 2019-03-14 DIAGNOSIS — L8990 Pressure ulcer of unspecified site, unspecified stage: Secondary | ICD-10-CM | POA: Diagnosis present

## 2019-03-14 DIAGNOSIS — J9621 Acute and chronic respiratory failure with hypoxia: Secondary | ICD-10-CM

## 2019-03-14 DIAGNOSIS — Z809 Family history of malignant neoplasm, unspecified: Secondary | ICD-10-CM | POA: Diagnosis not present

## 2019-03-14 DIAGNOSIS — R7401 Elevation of levels of liver transaminase levels: Secondary | ICD-10-CM | POA: Diagnosis present

## 2019-03-14 DIAGNOSIS — Z8546 Personal history of malignant neoplasm of prostate: Secondary | ICD-10-CM | POA: Diagnosis not present

## 2019-03-14 DIAGNOSIS — F17210 Nicotine dependence, cigarettes, uncomplicated: Secondary | ICD-10-CM | POA: Diagnosis present

## 2019-03-14 DIAGNOSIS — I2510 Atherosclerotic heart disease of native coronary artery without angina pectoris: Secondary | ICD-10-CM | POA: Diagnosis present

## 2019-03-14 DIAGNOSIS — Z79899 Other long term (current) drug therapy: Secondary | ICD-10-CM | POA: Diagnosis not present

## 2019-03-14 DIAGNOSIS — F329 Major depressive disorder, single episode, unspecified: Secondary | ICD-10-CM | POA: Diagnosis present

## 2019-03-14 DIAGNOSIS — J9622 Acute and chronic respiratory failure with hypercapnia: Secondary | ICD-10-CM | POA: Diagnosis present

## 2019-03-14 DIAGNOSIS — Z66 Do not resuscitate: Secondary | ICD-10-CM | POA: Diagnosis present

## 2019-03-14 DIAGNOSIS — I10 Essential (primary) hypertension: Secondary | ICD-10-CM | POA: Diagnosis present

## 2019-03-14 DIAGNOSIS — Z888 Allergy status to other drugs, medicaments and biological substances status: Secondary | ICD-10-CM | POA: Diagnosis not present

## 2019-03-14 DIAGNOSIS — F1010 Alcohol abuse, uncomplicated: Secondary | ICD-10-CM | POA: Diagnosis present

## 2019-03-14 DIAGNOSIS — Z8249 Family history of ischemic heart disease and other diseases of the circulatory system: Secondary | ICD-10-CM | POA: Diagnosis not present

## 2019-03-14 LAB — TROPONIN I
Troponin I: 0.05 ng/mL (ref <0.03)
Troponin I: 0.04 ng/mL (ref <0.03)
Troponin I: 0.03 ng/mL (ref <0.03)

## 2019-03-14 LAB — CBC
HCT: 35.9 % — ABNORMAL LOW (ref 39.0–52.0)
HCT: 34.5 % — ABNORMAL LOW (ref 39.0–52.0)
Hemoglobin: 12.4 g/dL — ABNORMAL LOW (ref 13.0–17.0)
Hemoglobin: 12.4 g/dL — ABNORMAL LOW (ref 13.0–17.0)
MCH: 29.9 pg (ref 26.0–34.0)
MCH: 30.8 pg (ref 26.0–34.0)
MCHC: 34.5 g/dL (ref 30.0–36.0)
MCHC: 35.9 g/dL (ref 30.0–36.0)
MCV: 86.5 fL (ref 80.0–100.0)
MCV: 85.8 fL (ref 80.0–100.0)
Platelets: 279 10*3/uL (ref 150–400)
Platelets: 286 10*3/uL (ref 150–400)
RBC: 4.15 MIL/uL — ABNORMAL LOW (ref 4.22–5.81)
RBC: 4.02 MIL/uL — ABNORMAL LOW (ref 4.22–5.81)
RDW: 12.6 % (ref 11.5–15.5)
RDW: 12.6 % (ref 11.5–15.5)
WBC: 13.2 10*3/uL — ABNORMAL HIGH (ref 4.0–10.5)
WBC: 12.9 10*3/uL — ABNORMAL HIGH (ref 4.0–10.5)
nRBC: 0 % (ref 0.0–0.2)
nRBC: 0 % (ref 0.0–0.2)

## 2019-03-14 LAB — LIPID PANEL
Cholesterol: 104 mg/dL (ref 0–200)
HDL: 51 mg/dL (ref >40)
LDL Cholesterol: 48 mg/dL (ref 0–99)
Total CHOL/HDL Ratio: 2 RATIO
Triglycerides: 26 mg/dL (ref <150)
VLDL: 5 mg/dL (ref 0–40)

## 2019-03-14 LAB — HEMOGLOBIN A1C
Hgb A1c MFr Bld: 5.1 % (ref 4.8–5.6)
Mean Plasma Glucose: 99.67 mg/dL

## 2019-03-14 LAB — RESPIRATORY PANEL BY PCR
Adenovirus: NOT DETECTED
Bordetella pertussis: NOT DETECTED
Chlamydophila pneumoniae: NOT DETECTED
Coronavirus 229E: NOT DETECTED
Coronavirus HKU1: NOT DETECTED
Coronavirus NL63: NOT DETECTED
Coronavirus OC43: NOT DETECTED
INFLUENZA A-RVPPCR: NOT DETECTED
Influenza B: NOT DETECTED
Metapneumovirus: NOT DETECTED
Mycoplasma pneumoniae: NOT DETECTED
Parainfluenza Virus 1: NOT DETECTED
Parainfluenza Virus 2: NOT DETECTED
Parainfluenza Virus 3: NOT DETECTED
Parainfluenza Virus 4: NOT DETECTED
Respiratory Syncytial Virus: NOT DETECTED
Rhinovirus / Enterovirus: NOT DETECTED

## 2019-03-14 LAB — GLUCOSE, CAPILLARY
Glucose-Capillary: 120 mg/dL — ABNORMAL HIGH (ref 70–99)
Glucose-Capillary: 149 mg/dL — ABNORMAL HIGH (ref 70–99)

## 2019-03-14 LAB — BASIC METABOLIC PANEL
Anion gap: 11 (ref 5–15)
BUN: 13 mg/dL (ref 8–23)
CHLORIDE: 81 mmol/L — AB (ref 98–111)
CO2: 20 mmol/L — ABNORMAL LOW (ref 22–32)
Calcium: 7.9 mg/dL — ABNORMAL LOW (ref 8.9–10.3)
Creatinine, Ser: 0.63 mg/dL (ref 0.61–1.24)
GFR calc Af Amer: >60 mL/min (ref >60)
GFR calc non Af Amer: >60 mL/min (ref >60)
Glucose, Bld: 129 mg/dL — ABNORMAL HIGH (ref 70–99)
Potassium: 4.5 mmol/L (ref 3.5–5.1)
Sodium: 112 mmol/L — CL (ref 135–145)

## 2019-03-14 LAB — ECHOCARDIOGRAM LIMITED
Height: 69 in
Weight: 2289.26 oz

## 2019-03-14 LAB — SODIUM
Sodium: 113 mmol/L — CL (ref 135–145)
Sodium: 115 mmol/L — CL (ref 135–145)
Sodium: 117 mmol/L — CL (ref 135–145)
Sodium: 117 mmol/L — CL (ref 135–145)

## 2019-03-14 LAB — HEPARIN LEVEL (UNFRACTIONATED): Heparin Unfractionated: <0.1 IU/mL — ABNORMAL LOW (ref 0.30–0.70)

## 2019-03-14 LAB — TSH: TSH: 1.121 u[IU]/mL (ref 0.350–4.500)

## 2019-03-14 LAB — OSMOLALITY: Osmolality: 232 mOsm/kg — CL (ref 275–295)

## 2019-03-14 LAB — OSMOLALITY, URINE: OSMOLALITY UR: 394 mosm/kg (ref 300–900)

## 2019-03-14 LAB — MRSA PCR SCREENING: MRSA by PCR: NEGATIVE

## 2019-03-14 LAB — CORTISOL: Cortisol, Plasma: 24.9 ug/dL

## 2019-03-14 LAB — SODIUM, URINE, RANDOM

## 2019-03-14 MED ORDER — PREDNISONE 20 MG PO TABS
40.0000 mg | ORAL_TABLET | Freq: Every day | ORAL | Status: DC
  Administered 2019-03-14 – 2019-03-16 (×3): 40 mg via ORAL
  Filled 2019-03-14 (×3): qty 2

## 2019-03-14 MED ORDER — ACETAMINOPHEN 325 MG PO TABS: 650.0000 mg | ORAL_TABLET | ORAL | Status: DC | PRN

## 2019-03-14 MED ORDER — SODIUM CHLORIDE 0.9 % IV BOLUS
250.0000 mL | Freq: Once | INTRAVENOUS | Status: AC
  Administered 2019-03-14: 250 mL via INTRAVENOUS

## 2019-03-14 MED ORDER — LORAZEPAM 2 MG/ML IJ SOLN: 1.0000 mg | INTRAMUSCULAR | Status: DC | PRN

## 2019-03-14 MED ORDER — ASPIRIN 81 MG PO CHEW
81.0000 mg | CHEWABLE_TABLET | Freq: Every day | ORAL | Status: DC
  Administered 2019-03-14 – 2019-03-16 (×3): 81 mg via ORAL
  Filled 2019-03-14 (×3): qty 1

## 2019-03-14 MED ORDER — BUDESONIDE 0.25 MG/2ML IN SUSP
0.2500 mg | Freq: Two times a day (BID) | RESPIRATORY_TRACT | Status: DC
  Administered 2019-03-14 – 2019-03-16 (×5): 0.25 mg via RESPIRATORY_TRACT
  Filled 2019-03-14 (×6): qty 2

## 2019-03-14 MED ORDER — CLOPIDOGREL BISULFATE 75 MG PO TABS
75.0000 mg | ORAL_TABLET | Freq: Every day | ORAL | Status: DC
  Administered 2019-03-14 – 2019-03-16 (×3): 75 mg via ORAL
  Filled 2019-03-14 (×3): qty 1

## 2019-03-14 MED ORDER — SODIUM CHLORIDE 0.9 % IV SOLN
1.0000 g | INTRAVENOUS | Status: DC
  Filled 2019-03-14: qty 10

## 2019-03-14 MED ORDER — AZITHROMYCIN 500 MG PO TABS
500.0000 mg | ORAL_TABLET | Freq: Every day | ORAL | Status: AC
  Administered 2019-03-14 – 2019-03-16 (×3): 500 mg via ORAL
  Filled 2019-03-14 (×3): qty 1

## 2019-03-14 MED ORDER — ATORVASTATIN CALCIUM 80 MG PO TABS
80.0000 mg | ORAL_TABLET | Freq: Every day | ORAL | Status: DC
  Administered 2019-03-14 – 2019-03-15 (×2): 80 mg via ORAL
  Filled 2019-03-14 (×2): qty 1

## 2019-03-14 MED ORDER — HEPARIN (PORCINE) 25000 UT/250ML-% IV SOLN
1000.0000 [IU]/h | INTRAVENOUS | Status: DC
  Administered 2019-03-14: 800 [IU]/h via INTRAVENOUS
  Filled 2019-03-14: qty 250

## 2019-03-14 MED ORDER — ADULT MULTIVITAMIN W/MINERALS CH
1.0000 | ORAL_TABLET | Freq: Every day | ORAL | Status: DC
  Administered 2019-03-14 – 2019-03-16 (×3): 1 via ORAL
  Filled 2019-03-14 (×3): qty 1

## 2019-03-14 MED ORDER — AMLODIPINE BESYLATE 5 MG PO TABS: 5.0000 mg | ORAL_TABLET | Freq: Every day | ORAL | Status: DC

## 2019-03-14 MED ORDER — HEPARIN BOLUS VIA INFUSION
2000.0000 [IU] | Freq: Once | INTRAVENOUS | Status: AC
  Administered 2019-03-14: 2000 [IU] via INTRAVENOUS
  Filled 2019-03-14: qty 2000

## 2019-03-14 MED ORDER — ARFORMOTEROL TARTRATE 15 MCG/2ML IN NEBU
15.0000 ug | INHALATION_SOLUTION | Freq: Two times a day (BID) | RESPIRATORY_TRACT | Status: DC
  Administered 2019-03-14 – 2019-03-16 (×5): 15 ug via RESPIRATORY_TRACT
  Filled 2019-03-14 (×6): qty 2

## 2019-03-14 MED ORDER — IPRATROPIUM-ALBUTEROL 0.5-2.5 (3) MG/3ML IN SOLN
3.0000 mL | Freq: Four times a day (QID) | RESPIRATORY_TRACT | Status: AC
  Administered 2019-03-14 – 2019-03-15 (×8): 3 mL via RESPIRATORY_TRACT
  Filled 2019-03-14 (×8): qty 3

## 2019-03-14 MED ORDER — FOLIC ACID 1 MG PO TABS
1.0000 mg | ORAL_TABLET | Freq: Every day | ORAL | Status: DC
  Administered 2019-03-14 – 2019-03-16 (×3): 1 mg via ORAL
  Filled 2019-03-14 (×3): qty 1

## 2019-03-14 MED ORDER — ONDANSETRON HCL 4 MG/2ML IJ SOLN: 4.0000 mg | Freq: Four times a day (QID) | INTRAMUSCULAR | Status: DC | PRN

## 2019-03-14 MED ORDER — VITAMIN B-1 100 MG PO TABS
100.0000 mg | ORAL_TABLET | Freq: Every day | ORAL | Status: DC
  Administered 2019-03-14 – 2019-03-16 (×3): 100 mg via ORAL
  Filled 2019-03-14 (×3): qty 1

## 2019-03-14 NOTE — Progress Notes (Signed)
78 year old smoker and heavy drinker with COPD on 4 L admitted with severe hyponatremia 109 and shortness of breath for 2 weeks treated as COPD exacerbation.   - EKG showed ST elevation in inferior leads but troponins have been flat.  Cardiac cath was deferred.  Heparin has been discontinued, he does not have any chest pain.  Aggressive risk factor modification is advised.  He will continue on aspirin and Plavix.  Await echo.  -Hyponatremia appears to be acute on chronic-his baseline appears to be in 128 range.  He was kept on fluid restriction and sodium did not change, started on IV normal saline at 50/hour and 4 hours his sodium increased from 1 1 3-1 1 5-which is appropriate. Osmolality is low.  He does not have mental status changes so does not need 3% saline at this time  -Will be treated with steroids and bronchodilators for COPD exacerbation  Rakesh V. Elsworth Soho MD

## 2019-03-14 NOTE — Progress Notes (Signed)
*  PRELIMINARY RESULTS* Echocardiogram 2D Echocardiogram LIMITED has been performed.  Leavy Cella 03/14/2019, 3:27 PM

## 2019-03-14 NOTE — Progress Notes (Signed)
   03/14/19 0900  Clinical Encounter Type  Visited With Patient and family together;Health care provider  Visit Type Initial;ED;Code  Referral From Other (Comment) (STEMI pg)  Spiritual Encounters  Spiritual Needs Emotional;Prayer  Stress Factors  Patient Stress Factors Exhausted;Health changes  Family Stress Factors Loss of control   Met w/ spouse at pt's bedside.  Pt not in a place to converse at this time.  Spouse was speaking by phone w/ couple's children.  Empathetic listening to spouse, prayed.  Myra Gianotti resident, [phone]

## 2019-03-14 NOTE — Progress Notes (Signed)
[place] Progress Note Patient Name: Joshua Whitaker DOB: 26-Jan-1941 MRN: [id number]   Date of Service  03/14/2019  HPI/Events of Note  Hyponatremia - Na+ = 115 -->117 --> 117.  eICU Interventions  Continue to trend Na+.     Intervention Category Major Interventions: Electrolyte abnormality - evaluation and management  Joshua Whitaker 03/14/2019, 10:17 PM

## 2019-03-14 NOTE — Progress Notes (Signed)
CRITICAL VALUE ALERT  Critical Value: Serum Osmo  Date & Time Notied:  03/14/19 0304  Provider Notified: E-Link RN  Orders Received/Actions taken: Will inform Md. No new orders at this time.

## 2019-03-14 NOTE — ED Notes (Signed)
ED TO INPATIENT HANDOFF REPORT  ED Nurse Name and Phone #:  Herbie Baltimore RN [phone]  S Name/Age/Gender Joshua Whitaker 78 y.o. male Room/Bed: 027C/027C  Code Status   Code Status: DNR  Home/SNF/Other Home {Patient oriented to: PERSON/PLACE/TIME/SITUATION  Is this baseline? YES  Triage Complete: Triage complete  Chief Complaint SOB  Triage Note Pt arrived via [facility] from home where pt where family  Called EMS out for SOB. Pt has history of recurring Whitaker infections and COPD. Pt is a current smoker. EMS tx PTA: 2mg  IV mag, 125mg  solumedrol, 3x duoneb without improvement. Pt presented diaphoretic and labored breathing. Sp02 100% on empty nebulizer mask upon arrival. No distress noted. Pt denied chest pain at time of arrival. EDP notified of pt presentation; reporting to bedside immediately. EMS EKG reported STEMI however it failed to sent to [facility]. ED EKG reported STEMI, EDP notified. Second line started upon arrival to ED. Pt was able to verbalize concerns and was alert and oriented at time of triage.    Allergies Allergies  Allergen Reactions  . Tiotropium Bromide Monohydrate Other (See Comments)    Severe urinary retention  . Varenicline Other (See Comments)    Psychiatric and dreams  . Iodinated Diagnostic Agents   . Statins     Level of Care/Admitting Diagnosis ED Disposition    ED Disposition Condition [facility]: [place] [[id number]]  Level of Care: ICU [6]  Diagnosis: STEMI (ST elevation myocardial infarction) [facility]) [[id number]]  Admitting Physician: Crista Luria [phone]  Attending Physician: Crista Luria [phone]  Estimated length of stay: 3 - 4 days  Certification:: I certify this patient will need inpatient services for at least 2 midnights  PT Class (Do Not Modify): Inpatient [101]  PT Acc Code (Do Not Modify): Private [1]       B Medical/Surgery History Past Medical History:  Diagnosis Date  .  Allergy   . Anxiety   . Asthma   . Cancer ([place]) 2007   prosate  . COPD (chronic obstructive pulmonary disease) ([place])   . Depression   . Prostate cancer [facility])    Past Surgical History:  Procedure Laterality Date  . EYE SURGERY    . PROSTATE SURGERY    . SPINE SURGERY       A IV Location/Drains/Wounds Patient Lines/Drains/Airways Status   Active Line/Drains/Airways    Name:   Placement date:   Placement time:   Site:   Days:   Peripheral IV 03/13/19 Right Forearm   03/13/19    -    Forearm   1   Peripheral IV 03/13/19 Left Forearm   03/13/19    2312    Forearm   1          Intake/Output Last 24 hours No intake or output data in the 24 hours ending 03/14/19 0159  Labs/Imaging Results for orders placed or performed during the hospital encounter of 03/13/19 (from the past 48 hour(s))  CBC with Differential     Status: Abnormal   Collection Time: 03/13/19 11:04 PM  Result Value Ref Range   WBC 14.7 (H) 4.0 - 10.5 K/uL   RBC 4.36 4.22 - 5.81 MIL/uL   Hemoglobin 13.2 13.0 - 17.0 g/dL   HCT 37.4 (L) 39.0 - 52.0 %   MCV 85.8 80.0 - 100.0 fL   MCH 30.3 26.0 - 34.0 pg   MCHC 35.3 30.0 - 36.0 g/dL  RDW 12.5 11.5 - 15.5 %   Platelets 335 150 - 400 K/uL   nRBC 0.0 0.0 - 0.2 %   Neutrophils Relative % 83 %   Neutro Abs 12.1 (H) 1.7 - 7.7 K/uL   Lymphocytes Relative 6 %   Lymphs Abs 0.9 0.7 - 4.0 K/uL   Monocytes Relative 10 %   Monocytes Absolute 1.5 (H) 0.1 - 1.0 K/uL   Eosinophils Relative 0 %   Eosinophils Absolute 0.0 0.0 - 0.5 K/uL   Basophils Relative 0 %   Basophils Absolute 0.0 0.0 - 0.1 K/uL   Immature Granulocytes 1 %   Abs Immature Granulocytes 0.21 (H) 0.00 - 0.07 K/uL    Comment: Performed at [facility] [address], [place], [place] [address]  Comprehensive metabolic panel     Status: Abnormal   Collection Time: 03/13/19 11:04 PM  Result Value Ref Range   Sodium 109 (LL) 135 - 145 mmol/L    Comment: CRITICAL RESULT CALLED TO, READ BACK BY AND  VERIFIED WITH: LOWDREMILK,S RN 03/13/2019 2356 JORDANS    Potassium 4.8 3.5 - 5.1 mmol/L   Chloride 76 (L) 98 - 111 mmol/L   CO2 21 (L) 22 - 32 mmol/L   Glucose, Bld 133 (H) 70 - 99 mg/dL   BUN 14 8 - 23 mg/dL   Creatinine, Ser 0.68 0.61 - 1.24 mg/dL   Calcium 7.7 (L) 8.9 - 10.3 mg/dL   Total Protein 5.9 (L) 6.5 - 8.1 g/dL   Albumin 2.8 (L) 3.5 - 5.0 g/dL   AST 39 15 - 41 U/L   ALT 41 0 - 44 U/L   Alkaline Phosphatase 150 (H) 38 - 126 U/L   Total Bilirubin 0.7 0.3 - 1.2 mg/dL   GFR calc non Af Amer >60 >60 mL/min   GFR calc Af Amer >60 >60 mL/min   Anion gap 12 5 - 15    Comment: Performed at [facility], [facility] [address], [place], [place] [address]  Brain natriuretic peptide     Status: Abnormal   Collection Time: 03/13/19 11:04 PM  Result Value Ref Range   B Natriuretic Peptide 383.2 (H) 0.0 - 100.0 pg/mL    Comment: Performed at [facility] [address] [place], [place] [address]  Troponin I - ONCE - STAT     Status: Abnormal   Collection Time: 03/13/19 11:04 PM  Result Value Ref Range   Troponin I 0.06 (HH) <0.03 ng/mL    Comment: CRITICAL RESULT CALLED TO, READ BACK BY AND VERIFIED WITH: LOWDERMILK,S RN 03/13/2019 2356 JORDANS Performed at [facility], [place] [address], [place], [place] [address]   I-Stat Troponin, ED (not at [facility])     Status: None   Collection Time: 03/13/19 11:24 PM  Result Value Ref Range   Troponin i, poc 0.08 0.00 - 0.08 ng/mL   Comment 3            Comment: Due to the release kinetics of cTnI, a negative result within the first hours of the onset of symptoms does not rule out myocardial infarction with certainty. If myocardial infarction is still suspected, repeat the test at appropriate intervals.    Dg Chest Port 1 View  Result Date: 03/13/2019 CLINICAL DATA:  Cough and chronic shortness of breath worse today, dyspnea EXAM: PORTABLE CHEST 1 VIEW COMPARISON:  01/06/2019 FINDINGS: Mild enlargement of cardiac silhouette.  Atherosclerotic calcification aorta. Mediastinal contours and pulmonary vascularity normal.  Emphysematous and mild bronchitic changes consistent with COPD. Scarring in LEFT upper lobe and minimally at LEFT base. No acute infiltrate, pleural effusion or pneumothorax. Bones demineralized. IMPRESSION: COPD changes without acute infiltrate. Mild enlargement of cardiac silhouette. Electronically Signed   By: Lavonia Dana M.D.   On: 03/13/2019 23:17    Pending Labs Unresulted Labs (From admission, onward)    Start     Ordered   03/15/19 0500  Heparin level (unfractionated)  Daily,   R     03/14/19 0000   03/14/19 0800  Heparin level (unfractionated)  Once-Timed,   R     03/14/19 0000   03/14/19 0500  CBC  Daily,   R     03/14/19 0000   03/14/19 0500  CBC  Daily,   R     03/14/19 0149   03/14/19 1443  Basic metabolic panel  Daily,   R     03/14/19 0149   03/14/19 0400  Troponin I - Now Then Q4H  Now then every 4 hours,   R     03/14/19 0149   03/14/19 0400  Sodium  Now then every 4 hours,   R     03/14/19 0149   03/14/19 0139  Cortisol  Once,   R     03/14/19 0149   03/14/19 0138  Sodium, urine, random  Once,   R     03/14/19 0149   03/14/19 0138  Osmolality, urine  Once,   R     03/14/19 0149   03/14/19 0138  Osmolality  Once,   R     03/14/19 0149   03/14/19 0138  TSH  Once,   R     03/14/19 0149   03/13/19 2350  Respiratory Panel by PCR  (Respiratory virus panel with precautions)  Once,   R     03/13/19 2349          Vitals/Pain Today's Vitals   03/14/19 0100 03/14/19 0115 03/14/19 0130 03/14/19 0139  BP: 117/63 (!) 153/75 131/67   Pulse: 84 84 85   Resp: 16 18 14    Temp:      TempSrc:      SpO2: 99% 100% 100%   Weight:      Height:      PainSc:    0-No pain    Isolation Precautions Droplet precaution  Medications Medications  0.9 %  sodium chloride infusion ( Intravenous New Bag/Given 03/13/19 2339)  nitroGLYCERIN (NITROSTAT) SL tablet 0.4 mg (has no administration  in time range)  doxycycline (VIBRAMYCIN) 100 mg in sodium chloride 0.9 % 250 mL IVPB (100 mg Intravenous New Bag/Given 03/14/19 0024)  heparin ADULT infusion 100 units/mL (25000 units/253mL sodium chloride 0.45%) (800 Units/hr Intravenous New Bag/Given 03/14/19 0024)  amLODipine (NORVASC) tablet 5 mg (has no administration in time range)  predniSONE (DELTASONE) tablet 40 mg (has no administration in time range)  arformoterol (BROVANA) nebulizer solution 15 mcg (has no administration in time range)  budesonide (PULMICORT) nebulizer solution 0.25 mg (has no administration in time range)  ipratropium-albuterol (DUONEB) 0.5-2.5 (3) MG/3ML nebulizer solution 3 mL (has no administration in time range)  cefTRIAXone (ROCEPHIN) 1 g in sodium chloride 0.9 % 100 mL IVPB (has no administration in time range)  azithromycin (ZITHROMAX) tablet 500 mg (has no administration in time range)  acetaminophen (TYLENOL) tablet 650 mg (has no administration in time range)  ondansetron (ZOFRAN) injection 4 mg (has no administration in time range)  aspirin chewable  tablet 81 mg (has no administration in time range)  clopidogrel (PLAVIX) tablet 75 mg (has no administration in time range)  LORazepam (ATIVAN) injection 1-2 mg (has no administration in time range)  thiamine (VITAMIN B-1) tablet 100 mg (has no administration in time range)  folic acid (FOLVITE) tablet 1 mg (has no administration in time range)  multivitamin with minerals tablet 1 tablet (has no administration in time range)  aspirin chewable tablet 324 mg (324 mg Oral Given 03/13/19 2339)  heparin injection 3,800 Units (3,800 Units Intravenous Given 03/13/19 2340)  albuterol (PROVENTIL,VENTOLIN) solution continuous neb (10 mg/hr Nebulization Given 03/14/19 0007)  magnesium sulfate IVPB 2 g 50 mL (0 g Intravenous Stopped 03/14/19 0022)  ipratropium (ATROVENT) nebulizer solution 1 mg (1 mg Nebulization Given 03/14/19 0007)  sodium chloride 0.9 % bolus 250 mL (0 mLs  Intravenous Stopped 03/14/19 0120)    Mobility walks     Focused Assessments BIPAP    R Recommendations: See Admitting Provider Note  Report given to:   Additional Notes: HEPARIN DRIP , NO CHEST PAIN , BREATHING EASIER/RESPIRATIONS IMPROVED WHILE ON BIPAP . IV SITES INTACT .

## 2019-03-14 NOTE — H&P (Signed)
NAME:  Joshua Whitaker, MRN:  [id number], DOB:  September 03, 1941, LOS: 0 ADMISSION DATE:  03/13/2019, CONSULTATION DATE:  03/14/2019 REFERRING MD:  ED provider, CHIEF COMPLAINT:  Shortness of breath   History of present illness   Joshua Whitaker is a 78 year old gentleman with history significant for COPD with chronic hypoxic respiratory failure on 4 L of oxygen at home and history of hypertension who presented to the ED with progressive shortness of breath.  Patient is somnolent but arouses and obeys simple commands.  History is obtained from patient's wife.  Patient apparently developed shortness of breath about 2 weeks ago.  Shortness of breath has been progressively worsening.  Initially he had symptoms only with exertion but currently has been having symptoms even at rest.  Patient has also been having a cough productive of whitish sputum.  He is a current smoker and has known COPD.  He does not cough or produce sputum at baseline.  He has not been having any fever or chills.  He has not been in contact with anybody who is sick.  He has not had any recent travel.  He has no nausea vomiting.  The progressive shortness of breath called EMS today and when EMS got to patient's house and EKG done revealed ST elevation in the inferior leads.  Code STEMI was activated.  On arrival here patient was evaluated by cardiology and there were initial plans for left heart cath however patient was found to be hyponatremic with a sodium of 109 and also stated that he  would not want to be intubated, which cardiology felt he would need for the procedure.   Patient's wife states that patient drinks heavily.  He drinks at least a sixpack daily.  She states that when patient goes without drinking he develops withdrawal symptoms.  Patient has never had withdrawal seizures.  He last had a drink a few hours ago.  He states that patient has not been eating well and has just been drinking alcohol.  He has not had any nausea or  vomiting.  He has had occasional diarrhea.  Past Medical History  -COPD -Chronic hypoxic respiratory failure on 4 L of oxygen  Significant Hospital Events   -Admission 3/21  Consults:  -Cardiology  Procedures:  -None  Significant Diagnostic Tests:  -BMP sodium 109, chloride 76 -BNP elevated at 383.2 -Chest x-ray hyperinflation  Micro Data:  -None   Antimicrobials:  -has received one dose of doxycycline in the ED for COPD exacerbation   Objective   Blood pressure 131/67, pulse 85, temperature 98.2 F (36.8 C), temperature source Oral, resp. rate 14, height 5\' 9"  (1.753 m), weight 68 kg, SpO2 100 %.       No intake or output data in the 24 hours ending 03/14/19 0150 Filed Weights   03/13/19 2300 03/13/19 2345  Weight: 63 kg 68 kg    Examination: General: Patient is asleep on BiPAP, he arouses and follows simple commands HENT: BiPAP in place Lungs: Bilateral scattered wheezes Cardiovascular: Regular rate, no murmurs Abdomen: Soft and nontender, no organomegaly Extremities: No focal deficits, no edema Neuro: No neurological focal deficit GU: Normal exam   Assessment & Plan:  #Acute on Chronic hypoxic respiratory failure #COPD exacerbation Presents with progressive shortness of breath for the past 2 weeks.  He also endorses cough productive of increased volume of sputum.  Symptoms are suggestive of COPD exacerbation.  He has had no fever or chills.  Chest x-ray not concerning for pneumonia. -  Brovana twice daily -Pulmicort twice daily -Scheduled duo nebs every 6 hours -RT to evaluate and treat -Will put on azithromycin for COPD exacerbation -Maintain on bipap overnight then as needed  -Continue oxygen and titrate to continuous SPO2 of more than 88% -Prednisone 40mg  for 5 days -Pulmonary toilet to include incentive spirometry, Out of bed to chair  #STEMI Patient found to have ST elevation in the inferior leads with elevated troponin. He has not had any chest  pain but his shortness of breath could be an anginal equivalent. He has been evaluate by cardiology in the ED and they have recommended medical management for now -Continue heparin gtt for 48 hours -ASA 81 mg daily -Plavix 75mg  -Obtain echo to evaluate cardiac function -Trend troponin -Check lipid panel and HbA1C for risk stratification  #Hyponatremia  Asymptomatic Patient with hyponatremia. He is euvolemic on exam. DDX include beer [place] given his drinking history vs SIADH though felt to be unlikely. He has significant smoking history and is at risk for malignancy that could cause SIADH. He had a CT in may of last year which showed a RUL spiculated nodule that had been stable for about 2 years and was felt to be a scar -Check urine sodium -Check urine osmolality -Check serum osmolality -Check TSH and cortisol -1260ml free water restriction -trend sodium Q4 -Target correction rate of about 91meq in 24 hours -If unclear etiology after labs are back would consider CT chest to rule out malignancy -Seizure precautions  #Hypertension -Continue amlodipine  #Alcohol abuse   Patient with history of heavy alcohol abuse and history of alcohol withdrawal. No reported history of alcohol withdrawal seizures. -CIWA protocol -PRN ativan for agitation and seizures -Folic acid -Thiamine -Multivitamin -Seizure precautions   Best practice:  Diet: Regular diet  Pain/Anxiety/Delirium protocol (if indicated): PRN ativan for alcohol withdrawal VAP protocol (if indicated): Not indicated  DVT prophylaxis: On heparin gtt  GI prophylaxis: Not indicated  Glucose control: Maintain euglycemia  Mobility: PT and OT to evaluate and treat  Code Status: DNR/DNI Family Communication: Updated patient's wife at bedside  Disposition:Admit to ICU   Labs   CBC: Recent Labs  Lab 03/13/19 2304  WBC 14.7*  NEUTROABS 12.1*  HGB 13.2  HCT 37.4*  MCV 85.8  PLT 194    Basic Metabolic Panel: Recent  Labs  Lab 03/13/19 2304  NA 109*  K 4.8  CL 76*  CO2 21*  GLUCOSE 133*  BUN 14  CREATININE 0.68  CALCIUM 7.7*   GFR: Estimated Creatinine Clearance: 76.7 mL/min (by C-G formula based on SCr of 0.68 mg/dL). Recent Labs  Lab 03/13/19 2304  WBC 14.7*    Liver Function Tests: Recent Labs  Lab 03/13/19 2304  AST 39  ALT 41  ALKPHOS 150*  BILITOT 0.7  PROT 5.9*  ALBUMIN 2.8*   ABG No results found for: PHART, PCO2ART, PO2ART, HCO3, TCO2, ACIDBASEDEF, O2SAT   Coagulation Profile: No results for input(s): INR, PROTIME in the last 168 hours.  Cardiac Enzymes: Recent Labs  Lab 03/13/19 2304  TROPONINI 0.06*    HbA1C: No results found for: HGBA1C  CBG: No results for input(s): GLUCAP in the last 168 hours.  Review of Systems:   Review of Systems  Constitutional: Negative for chills and fever.  HENT: Negative for ear discharge and ear pain.   Respiratory: Positive for cough, sputum production and shortness of breath.   Cardiovascular: Negative for chest pain, orthopnea and leg swelling.  Gastrointestinal: Negative for nausea and  vomiting.  Genitourinary: Negative for dysuria and urgency.  Musculoskeletal: Negative for myalgias.  Skin: Negative for rash.  Neurological: Negative for dizziness and headaches.    Past Medical History  He,  has a past medical history of Allergy, Anxiety, Asthma, Cancer ([place]) (2007), COPD (chronic obstructive pulmonary disease) ([place]), Depression, and Prostate cancer ([place]).   Surgical History    Past Surgical History:  Procedure Laterality Date  . EYE SURGERY    . PROSTATE SURGERY    . SPINE SURGERY       Social History   reports that he has been smoking cigarettes. He has been smoking about 1.00 pack per day. He has never used smokeless tobacco. He reports current alcohol use. He reports that he does not use drugs.   Family History   His family history includes Alcohol abuse in his daughter; Arthritis in his mother; Cancer  in his brother, brother, brother, sister, sister, and sister; Chromosomal disorder in his sister; Diabetes in his sister; Heart disease in his father and mother; Rheum arthritis in his daughter and daughter; Stroke in his brother.   Allergies Allergies  Allergen Reactions  . Tiotropium Bromide Monohydrate Other (See Comments)    Severe urinary retention  . Varenicline Other (See Comments)    Psychiatric and dreams  . Iodinated Diagnostic Agents   . Statins      Home Medications  Prior to Admission medications   Medication Sig Start Date End Date Taking? Authorizing Provider  amLODipine (NORVASC) 5 MG tablet Take 1 tablet (5 mg total) by mouth daily. Patient not taking: Reported on 01/06/2019 10/15/18   [name], MD  azithromycin [facility]) 250 MG tablet Take 2 tablets on day 1 and 1 tablet daily after that 03/10/19   [name], MD  cholestyramine Lucrezia Starch) 4 g packet  12/02/17   [provider]  fluticasone (FLONASE) 50 MCG/ACT nasal spray Place 1 spray into both nostrils daily. 10/15/18   [name], MD  Fluticasone-Umeclidin-Vilant (TRELEGY ELLIPTA) 100-62.5-25 MCG/INH AEPB Inhale 1 puff into the lungs daily. 10/15/18   [name], MD  Ipratropium-Albuterol (COMBIVENT RESPIMAT) 20-100 MCG/ACT AERS respimat Inhale 2 puffs into the lungs every 6 (six) hours. 10/15/18   [name], MD  predniSONE (STERAPRED UNI-PAK 21 TAB) 10 MG (21) TBPK tablet As directed x 6 days 01/06/19   Hassell Done Mary-Margaret, FNP  PROAIR HFA 108 [phone] Base) MCG/ACT inhaler Inhale 1-2 puffs into the lungs every 6 (six) hours as needed for wheezing or shortness of breath. 01/30/19   Hassell Done, Mary-Margaret, FNP  Triamcinolone Acetonide (TRIAMCINOLONE 0.1 % CREAM : EUCERIN) CREA Apply 1 application topically daily. Patient not taking: Reported on 10/15/2018 04/03/18   Timmothy Euler, MD     Critical care time: The patient is critically ill with multiple organ systems failure and requires  high complexity decision making for assessment and support, frequent evaluation and titration of therapies, application of advanced monitoring technologies and extensive interpretation of multiple databases.   Critical Care Time devoted to patient care services described in this note is 40 Minutes. This time reflects time of care of this signee Dr. Oswald Hillock. This critical care time does not reflect procedure time, or teaching time or supervisory time of PA/NP/Med student/Med Resident etc but could involve care discussion time.  Oswald Hillock, M.D. [facility] Pulmonary/Critical Care Medicine. Pager:  After hours pager: [phone].

## 2019-03-14 NOTE — Progress Notes (Signed)
Progress Note  Patient Name: Joshua Whitaker Date of Encounter: 03/14/2019  Primary Cardiologist: Candee Furbish, MD  Subjective   Patient in bed, wife in room. No new complaints, no CP. Breathing improved but still not at baseline.   Inpatient Medications    Scheduled Meds: . arformoterol  15 mcg Nebulization BID  . aspirin  81 mg Oral Daily  . azithromycin  500 mg Oral Daily  . budesonide (PULMICORT) nebulizer solution  0.25 mg Nebulization BID  . clopidogrel  75 mg Oral Daily  . folic acid  1 mg Oral Daily  . ipratropium-albuterol  3 mL Nebulization Q6H  . multivitamin with minerals  1 tablet Oral Daily  . predniSONE  40 mg Oral Q breakfast  . thiamine  100 mg Oral Daily   Continuous Infusions: . sodium chloride 50 mL/hr at 03/14/19 0944  . heparin 1,000 Units/hr (03/14/19 1200)   PRN Meds: acetaminophen, LORazepam, nitroGLYCERIN, ondansetron (ZOFRAN) IV   Vital Signs    Vitals:   03/14/19 0900 03/14/19 1000 03/14/19 1100 03/14/19 1200  BP: 133/63 137/65 131/62 132/73  Pulse: 86 83 88 92  Resp: 19 16 18  (!) 24  Temp:      TempSrc:      SpO2: 96% 95% 93% 93%  Weight:      Height:        Intake/Output Summary (Last 24 hours) at 03/14/2019 1351 Last data filed at 03/14/2019 1200 Gross per 24 hour  Intake 672.5 ml  Output 275 ml  Net 397.5 ml   Last 3 Weights 03/14/2019 03/13/2019 03/13/2019  Weight (lbs) 143 lb 1.3 oz 150 lb 138 lb 14.2 oz  Weight (kg) 64.9 kg 68.04 kg 63 kg      Telemetry    NSR, no VT- Personally Reviewed  ECG    Inferior 1 mm ST elevation noted on both ECG's - Personally Reviewed  Physical Exam   GEN: In bed comfortable, wife here   Neck: Beard Cardiac: Tele NSR.  Respiratory: Normal respiratory effort. GI: non-distended  MS No deformity. Neuro:  Nonfocal  Psych: Normal affect   Labs    Chemistry Recent Labs  Lab 03/13/19 2304 03/14/19 0417 03/14/19 0743 03/14/19 1201  NA 109* 112* 113* 115*  K 4.8 4.5  --   --    CL 76* 81*  --   --   CO2 21* 20*  --   --   GLUCOSE 133* 129*  --   --   BUN 14 13  --   --   CREATININE 0.68 0.63  --   --   CALCIUM 7.7* 7.9*  --   --   PROT 5.9*  --   --   --   ALBUMIN 2.8*  --   --   --   AST 39  --   --   --   ALT 41  --   --   --   ALKPHOS 150*  --   --   --   BILITOT 0.7  --   --   --   GFRNONAA >60 >60  --   --   GFRAA >60 >60  --   --   ANIONGAP 12 11  --   --      Hematology Recent Labs  Lab 03/13/19 2304 03/14/19 0207 03/14/19 0417  WBC 14.7* 13.2* 12.9*  RBC 4.36 4.15* 4.02*  HGB 13.2 12.4* 12.4*  HCT 37.4* 35.9* 34.5*  MCV 85.8 86.5 85.8  MCH 30.3 29.9 30.8  MCHC 35.3 34.5 35.9  RDW 12.5 12.6 12.6  PLT 335 279 286    Cardiac Enzymes Recent Labs  Lab 03/13/19 2304 03/14/19 0417 03/14/19 0743 03/14/19 1201  TROPONINI 0.06* 0.05* 0.04* 0.03*    Recent Labs  Lab 03/13/19 2324  TROPIPOC 0.08     BNP Recent Labs  Lab 03/13/19 2304  BNP 383.2*     DDimer No results for input(s): DDIMER in the last 168 hours.   Radiology    Dg Chest Port 1 View  Result Date: 03/13/2019 CLINICAL DATA:  Cough and chronic shortness of breath worse today, dyspnea EXAM: PORTABLE CHEST 1 VIEW COMPARISON:  01/06/2019 FINDINGS: Mild enlargement of cardiac silhouette. Atherosclerotic calcification aorta. Mediastinal contours and pulmonary vascularity normal. Emphysematous and mild bronchitic changes consistent with COPD. Scarring in LEFT upper lobe and minimally at LEFT base. No acute infiltrate, pleural effusion or pneumothorax. Bones demineralized. IMPRESSION: COPD changes without acute infiltrate. Mild enlargement of cardiac silhouette. Electronically Signed   By: Lavonia Dana M.D.   On: 03/13/2019 23:17    Cardiac Studies   Echocardiogram currently pending.  Patient Profile     78 y.o. male originally thought to have an ST elevation myocardial infarction inferiorly however troponins have been low, flat and decreasing.  This is not an acute  myocardial infarction.  Currently with COPD exacerbation with acute on chronic hypoxic respiratory failure  Assessment & Plan    Abnormal EKG/coronary artery calcification (extensive) - Clearly not an ST elevation myocardial infarction based upon the troponin trend which is flat and it decreasing, low level.  Could be considered low level demand ischemia or elevated troponin in the setting of COPD exacerbation. -Await echocardiogram. - At this time given the troponin trend, I stop heparin drip but continue his Plavix 75 mg. -No further cardiac testing at this. No angina currently. It is possible that he has underlying 3 vessel CAD. Treat him with aggressive secondary risk factor prevention. I will start atorvastatin 80mg  PO QD. Statins were listed as an allergy from 2012. I think is a good time to reintroduce. Given his current co morbidities would not be a good invasive candidate. Continue to treat lipids and BP.   Aortic atherosclerosis  - statin started.   Acute on chronic hypoxic respiratory failure - Per primary team.  Alcohol use -CIWA protocol.  Hyponatremia  - free water restriction, per main team  Will sign off.  Please let us know if we can be of further assistance.      For questions or updates, please contact [place] Please consult [email or web address] for contact info under        Signed, Candee Furbish, MD  03/14/2019, 1:51 PM

## 2019-03-14 NOTE — Progress Notes (Signed)
[place] for heparin Indication: chest pain/ACS  Allergies  Allergen Reactions  . Tiotropium Bromide Monohydrate Other (See Comments)    Severe urinary retention  . Varenicline Other (See Comments)    Psychiatric and dreams  . Iodinated Diagnostic Agents   . Statins     Patient Measurements: Height: 5\' 9"  (175.3 cm) Weight: 143 lb 1.3 oz (64.9 kg) IBW/kg (Calculated) : 70.7  Vital Signs: Temp: 97.3 F (36.3 C) (03/21 0725) Temp Source: Axillary (03/21 0725) BP: 127/82 (03/21 0700) Pulse Rate: 80 (03/21 0700)  Labs: Recent Labs    03/13/19 2304 03/14/19 0207 03/14/19 0417 03/14/19 0743  HGB 13.2 12.4* 12.4*  --   HCT 37.4* 35.9* 34.5*  --   PLT 335 279 286  --   HEPARINUNFRC  --   --   --  <0.10*  CREATININE 0.68  --  0.63  --   TROPONINI 0.06*  --  0.05* 0.04*   Medical History: Past Medical History:  Diagnosis Date  . Allergy   . Anxiety   . Asthma   . Cancer ([place]) 2007   prosate  . COPD (chronic obstructive pulmonary disease) ([place])   . Depression   . Prostate cancer [facility])     Assessment: 78yo male called EMS for SOB, found to be diaphoretic w/ labored breathing, denied CP but EKG showed STEMI, cards canceled code on arrival to ED, now to begin heparin.  Heparin level undetectable this morning on 800 units/hr. Hgb down to 12.4, plts ok, SCR 0.63 stable. No bleeding or infusion related issues per RN  Goal of Therapy:  Heparin level 0.3-0.7 units/ml Monitor platelets by anticoagulation protocol: Yes   Plan:  Give heparin bolus of 2000 units Increase heparin gtt to 1000 units/hr Heparin level in 6 hrs Monitor daily HL, CBC, s/sx bleeding  Juanell Fairly, PharmD PGY1 Pharmacy Resident Phone [phone] 03/14/2019 10:22 AM

## 2019-03-14 NOTE — Progress Notes (Signed)
RT NOTES: Removed patient from bipap and placed on 4lpm nasal cannula. RN aware.

## 2019-03-14 NOTE — Progress Notes (Signed)
Notified Dr.Sommer about critical lab, sodium 117. Will continue to monitor.

## 2019-03-14 NOTE — ED Notes (Signed)
RT at bedside applying BIPAP to pt.

## 2019-03-14 NOTE — Consult Note (Signed)
Cardiology Consultation:   Patient ID: Joshua Whitaker MRN: [id number]; DOB: 1941-12-03  Admit date: 03/13/2019 Date of Consult: 03/14/2019  Primary Care Provider: No primary care provider on file. Primary Cardiologist: No primary care provider on file.  Primary Electrophysiologist:  None    HPI:   Joshua Whitaker is a 78 y.o. active smoker with severe COPD, HTN who presents with worsening shortness of breath. Per patient's wife, his baseline dyspnea has worsened rapidly over the last 1-2 weeks. She reports an increase in his baseline cough productive of white sputum. No fevers, no chills, no sick contacts, and no travel. He has not complained of any chest pain or chest tightness.  Upon EMS activation today, patient reportedly found diaphoretic and tachypneic but initially oriented appropriately per report. EKG done by EMS notable for STE in inferior leads (II, III, AVF) without reciprocal changes anteriorly. Code STEMI was initially activated.   At the time of my initial encounter with patient, he was notably diaphoretic with labored breathing. Was on NRB satting 99% with BP 314H-702O systolic. HR low 100s. Exam notable for decreasing breath sounds throughout lungs. No m/r/g. Bounding radial pulses. No LE edema.   Initial plan was for coronary angiography given EKG changes. However, patient's current respiratory status and degree of lethargy would necessitate intubation in order to safely proceed with cardiac catheterization. Dr. Ellender Hose (ER physician) engaged in discussion with patient and patient's wife who both stated that patient did not desire intubation or any form of life support.   During this time, labs began to result and were notable for severe hypochloremic hyponatremia (Na 109, Cl 76) with initial BNP 383 and trop-I 0.06.   After discussion with on call interventional attending Dr. Ellyn Hack, decision made to defer invasive assessment in favor of medical therapy for ACS/CAD and  correction of respiratory failure and metabolic derangement.    Past Medical History:  Diagnosis Date   Allergy    Anxiety    Asthma    Cancer ([place]) 2007   prosate   COPD (chronic obstructive pulmonary disease) ([place])    Depression    Prostate cancer ([place])     Past Surgical History:  Procedure Laterality Date   EYE SURGERY     PROSTATE SURGERY     SPINE SURGERY       Home Medications:  Prior to Admission medications   Medication Sig Start Date End Date Taking? Authorizing Provider  amLODipine (NORVASC) 5 MG tablet Take 1 tablet (5 mg total) by mouth daily. Patient not taking: Reported on 01/06/2019 10/15/18   [name], MD  azithromycin [facility]) 250 MG tablet Take 2 tablets on day 1 and 1 tablet daily after that 03/10/19   [name], MD  cholestyramine Lucrezia Starch) 4 g packet  12/02/17   [provider]  fluticasone (FLONASE) 50 MCG/ACT nasal spray Place 1 spray into both nostrils daily. 10/15/18   [name], MD  Fluticasone-Umeclidin-Vilant (TRELEGY ELLIPTA) 100-62.5-25 MCG/INH AEPB Inhale 1 puff into the lungs daily. 10/15/18   [name], MD  Ipratropium-Albuterol (COMBIVENT RESPIMAT) 20-100 MCG/ACT AERS respimat Inhale 2 puffs into the lungs every 6 (six) hours. 10/15/18   [name], MD  predniSONE (STERAPRED UNI-PAK 21 TAB) 10 MG (21) TBPK tablet As directed x 6 days 01/06/19   Hassell Done Mary-Margaret, FNP  PROAIR HFA 108 [phone] Base) MCG/ACT inhaler Inhale 1-2 puffs into the lungs every 6 (six) hours as needed for wheezing or shortness of breath. 01/30/19   Chevis Pretty, [place]  Triamcinolone Acetonide (TRIAMCINOLONE 0.1 % CREAM : EUCERIN) CREA Apply 1 application topically daily. Patient not taking: Reported on 10/15/2018 04/03/18   Timmothy Euler, MD    Inpatient Medications: Scheduled Meds:  albuterol  10 mg/hr Nebulization Once   ipratropium  1 mg Nebulization Once   Continuous Infusions:  sodium chloride 10 mL/hr at  03/13/19 2339   doxycycline (VIBRAMYCIN) IV     heparin     magnesium sulfate 1 - 4 g bolus IVPB 2 g (03/13/19 2357)   PRN Meds: nitroGLYCERIN  Allergies:    Allergies  Allergen Reactions   Tiotropium Bromide Monohydrate Other (See Comments)    Severe urinary retention   Varenicline Other (See Comments)    Psychiatric and dreams   Iodinated Diagnostic Agents    Statins     Social History:   Social History   Socioeconomic History   Marital status: Married    Spouse name: Not on file   Number of children: 3   Years of education: 5th Grade and GED   Highest education level: GED or equivalent  Occupational History   Occupation: 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 all   Food insecurity:    Worry: Never true    Inability: Never true   Transportation needs:    Medical: No    Non-medical: No  Tobacco Use   Smoking status: Current Every Day Smoker    Packs/day: 1.00    Types: Cigarettes   Smokeless tobacco: Never Used  Substance and Sexual Activity   Alcohol use: Yes    Comment: occ   Drug use: No   Sexual activity: Not Currently  Lifestyle   Physical activity:    Days per week: 5 days    Minutes per session: 30 min   Stress: Only a little  Relationships   Social connections:    Talks on phone: More than three times a week    Gets together: More than three times a week    Attends religious service: Never    Active member of club or organization: No    Attends meetings of clubs or organizations: Never    Relationship status: Not on file   Intimate partner violence:    Fear of current or ex partner: No    Emotionally abused: No    Physically abused: No    Forced sexual activity: No  Other Topics Concern   Not on file  Social History Narrative   Not on file    Family History:    Family History  Problem Relation Age of Onset   Heart disease Mother    Arthritis Mother     Heart disease Father    Cancer Sister        kidney   Cancer Brother        lung   Cancer Brother        lung   Stroke Brother    Cancer Brother        prostate   Cancer Sister        colon   Cancer Sister        bone   Diabetes Sister    Chromosomal disorder Sister    Alcohol abuse Daughter    Rheum arthritis Daughter    Rheum arthritis Daughter     Review of Systems: [y] = yes, [ ]  = no     General: Weight gain [ ] ;  Weight loss [ ] ; Anorexia [ ] ; Fatigue [ ] ; Fever [ ] ; Chills [ ] ; Weakness [ ]    Cardiac: Chest pain/pressure [ ] ; Resting SOB [ y]; Exertional SOB Blue.Reese ]; Orthopnea [ ] ; Pedal Edema [ ] ; Palpitations [ ] ; Syncope [ ] ; Presyncope [ ] ; Paroxysmal nocturnal dyspnea[ ]    Pulmonary: Cough Blue.Reese ]; Wheezing[ ] ; Hemoptysis[ ] ; Sputum Blue.Reese ]; Snoring [ ]    GI: Vomiting[ ] ; Dysphagia[ ] ; Melena[ ] ; Hematochezia [ ] ; Heartburn[ ] ; Abdominal pain [ ] ; Constipation [ ] ; Diarrhea [ ] ; BRBPR [ ]    GU: Hematuria[ ] ; Dysuria [ ] ; Nocturia[ ]    Vascular: Pain in legs with walking [ ] ; Pain in feet with lying flat [ ] ; Non-healing sores [ ] ; Stroke [ ] ; TIA [ ] ; Slurred speech [ ] ;   Neuro: Headaches[ ] ; Vertigo[ ] ; Seizures[ ] ; Paresthesias[ ] ;Blurred vision [ ] ; Diplopia [ ] ; Vision changes [ ]    Ortho/Skin: Arthritis [ ] ; Joint pain [ ] ; Muscle pain [ ] ; Joint swelling [ ] ; Back Pain [ ] ; Rash [ ]    Psych: Depression[ ] ; Anxiety[ ]    Heme: Bleeding problems [ ] ; Clotting disorders [ ] ; Anemia [ ]    Endocrine: Diabetes [ ] ; Thyroid dysfunction[ ]   Physical Exam/Data:   Vitals:   03/13/19 2300 03/13/19 2315 03/13/19 2330 03/13/19 2345  BP: (!) 160/75 (!) 144/64 (!) 128/99 (!) 158/69  Pulse: 100 95 94 95  Resp: (!) 22 18 17 18   Temp:      TempSrc:      SpO2: 100% 99% 99% 100%  Weight: 63 kg   68 kg  Height: 5\' 7"  (1.702 m)   5\' 9"  (1.753 m)   No intake or output data in the 24 hours ending 03/14/19 0002 Filed Weights   03/13/19 2300 03/13/19 2345    Weight: 63 kg 68 kg   Body mass index is 22.15 kg/m.  General:  Thin, chronically ill appearing, diaphoretic, lethargic.  Neck: no JVD Endocrine:  No thryomegaly Vascular: 2+ radial pulses bilaterally.   Cardiac:  Tachycardic, no murmurs.  Lungs:  Poor aeration.  Abd: soft, nontender, no hepatomegaly  Ext: no edema Musculoskeletal:  No deformities, BUE and BLE strength normal and equal Skin: warm and dry  Neuro:  CNs 2-12 intact, no focal abnormalities noted Psych:  Normal affect   EKG:  The EKG was personally reviewed and demonstrates:  STE II, III, AVF without anterior reciprocal changes.   Relevant CV Studies: Cardiovascular: Extensive Calcified aortic atherosclerosis. Vascular patency is not evaluated in the absence of IV contrast. Calcified coronary artery atherosclerosis. No cardiomegaly or pericardial effusion.  Mediastinum/Nodes: Widespread but sub centimeter mediastinal lymph nodes appear stable. No mediastinal lymphadenopathy. Hilar nodes are more difficult to evaluate in the absence of IV contrast, but the hila appears stable.  Lungs/Pleura: Severe emphysema.  Upper lobe predominance.  Confluent architectural distortion has developed in the posterior left lung along what appears to be the superior aspect of the left major fissure since 2017. Some of the confluent opacity in this region however has partially regressed since February, with interval improved ventilation in the left subpleural lung seen on series 4, image 54.  Stable to decreased nodular area in the left mid lung on series 4, image 72.  Stable small left lower lobe pulmonary nodule on series 4, image 129.  Superimposed left upper lung bronchiectasis with occasional calcified granuloma. Additional bilateral peribronchial bronchiectasis.  The major airways are patent with generalized bilateral  peribronchial thickening.  Stable spiculated right upper lung nodule on series 4, image  46 since 2017 measuring about 10 millimeters.  Resolved peripheral right lower lobe subpleural nodule since February (would beyond series 4, image 101 today).  No pleural effusion or new pulmonary opacity.  Upper Abdomen: Surgically absent gallbladder. Stable visible upper abdominal viscera.  Musculoskeletal: Osteopenia. No acute osseous abnormality identified.  Laboratory Data:  Chemistry Recent Labs  Lab 03/13/19 2304  NA 109*  K 4.8  CL 76*  CO2 21*  GLUCOSE 133*  BUN 14  CREATININE 0.68  CALCIUM 7.7*  GFRNONAA >60  GFRAA >60  ANIONGAP 12    Recent Labs  Lab 03/13/19 2304  PROT 5.9*  ALBUMIN 2.8*  AST 39  ALT 41  ALKPHOS 150*  BILITOT 0.7   Hematology Recent Labs  Lab 03/13/19 2304  WBC 14.7*  RBC 4.36  HGB 13.2  HCT 37.4*  MCV 85.8  MCH 30.3  MCHC 35.3  RDW 12.5  PLT 335   Cardiac Enzymes Recent Labs  Lab 03/13/19 2304  TROPONINI 0.06*    Recent Labs  Lab 03/13/19 2324  TROPIPOC 0.08    BNP Recent Labs  Lab 03/13/19 2304  BNP 383.2*    DDimer No results for input(s): DDIMER in the last 168 hours.  Radiology/Studies:  Dg Chest Port 1 View  Result Date: 03/13/2019 CLINICAL DATA:  Cough and chronic shortness of breath worse today, dyspnea EXAM: PORTABLE CHEST 1 VIEW COMPARISON:  01/06/2019 FINDINGS: Mild enlargement of cardiac silhouette. Atherosclerotic calcification aorta. Mediastinal contours and pulmonary vascularity normal. Emphysematous and mild bronchitic changes consistent with COPD. Scarring in LEFT upper lobe and minimally at LEFT base. No acute infiltrate, pleural effusion or pneumothorax. Bones demineralized. IMPRESSION: COPD changes without acute infiltrate. Mild enlargement of cardiac silhouette. Electronically Signed   By: Lavonia Dana M.D.   On: 03/13/2019 23:17    Assessment and Plan:   Mr. Zwiefelhofer is a 78 year old man with advanced COPD and HTN who presents with two weeks of worsening shortness of breath found  to have profound metabolic derangement with severe, symptomatic hyponatremia to 109. Although initial concern was for ACS given ST changes on EKG, clinical history suggestive of a more subacute worsening of his baseline dyspnea and no reports of chest pain, tightness, or other anginal equivalent. Given his current mental and respiratory status, invasive angiography would not be safe without intubation, and since his wishes are to avoid this we will treat medically for presumed ACS for now while his more pressing issues of hyponatremia and respiratory failure are being addressed.   #STEMI v. Myocardial Injury -- Agree with heparin gtt ACS dosing -- ASA 325 given in ER, please start 81mg  daily -- Would start plavix 75 mg daily in addition for medical management of CAD -- Please order complete TTE -- Add on lipid panel, a1c for risk stratification -- Troponin q6 hrs x 3 for trend.   #Severe Hyponatremia -- Recommend STAT nephrology consult - suspect he will need hypertonic saline.  -- Urine lytes, urine osms, serum osms, TSH.  #Severe COPD #Hypoxemic +/- Hypercarbic Respiratory Failure -- Recommend STAT ABG given potential need for NIPPV.  -- Appreciate excellent pulmonology care -- Ok to target SpO2 88 - 92   Patient seen and discussed with Dr. Ellyn Hack.   For questions or updates, please contact [place] Please consult [email or web address] for contact info under   Signed, Milus Banister, MD  03/14/2019 12:02 AM

## 2019-03-15 LAB — BASIC METABOLIC PANEL
Anion gap: 9 (ref 5–15)
Anion gap: 7 (ref 5–15)
BUN: 14 mg/dL (ref 8–23)
BUN: 12 mg/dL (ref 8–23)
CHLORIDE: 92 mmol/L — AB (ref 98–111)
CO2: 25 mmol/L (ref 22–32)
CO2: 25 mmol/L (ref 22–32)
Calcium: 8.2 mg/dL — ABNORMAL LOW (ref 8.9–10.3)
Calcium: 8.2 mg/dL — ABNORMAL LOW (ref 8.9–10.3)
Chloride: 86 mmol/L — ABNORMAL LOW (ref 98–111)
Creatinine, Ser: 0.64 mg/dL (ref 0.61–1.24)
Creatinine, Ser: 0.64 mg/dL (ref 0.61–1.24)
GFR calc Af Amer: >60 mL/min (ref >60)
GFR calc Af Amer: >60 mL/min (ref >60)
GFR calc non Af Amer: >60 mL/min (ref >60)
GFR calc non Af Amer: >60 mL/min (ref >60)
GLUCOSE: 120 mg/dL — AB (ref 70–99)
Glucose, Bld: 104 mg/dL — ABNORMAL HIGH (ref 70–99)
Potassium: 5.1 mmol/L (ref 3.5–5.1)
Potassium: 4.6 mmol/L (ref 3.5–5.1)
Sodium: 120 mmol/L — ABNORMAL LOW (ref 135–145)
Sodium: 124 mmol/L — ABNORMAL LOW (ref 135–145)

## 2019-03-15 LAB — CBC
HCT: 34.2 % — ABNORMAL LOW (ref 39.0–52.0)
Hemoglobin: 12.1 g/dL — ABNORMAL LOW (ref 13.0–17.0)
MCH: 30.9 pg (ref 26.0–34.0)
MCHC: 35.4 g/dL (ref 30.0–36.0)
MCV: 87.2 fL (ref 80.0–100.0)
Platelets: 290 10*3/uL (ref 150–400)
RBC: 3.92 MIL/uL — ABNORMAL LOW (ref 4.22–5.81)
RDW: 13 % (ref 11.5–15.5)
WBC: 15.7 10*3/uL — ABNORMAL HIGH (ref 4.0–10.5)
nRBC: 0 % (ref 0.0–0.2)

## 2019-03-15 LAB — SODIUM: Sodium: 118 mmol/L — CL (ref 135–145)

## 2019-03-15 MED ORDER — HYDRALAZINE HCL 20 MG/ML IJ SOLN: 10.0000 mg | INTRAMUSCULAR | Status: DC | PRN

## 2019-03-15 NOTE — Progress Notes (Addendum)
Pt arrived to room. Assessed pt and asked patient to confirm his code status. Pt states "I am not a DNR. I just don't want a tube down my throat". Pt educated on CPR and asked if he would like [place] to perform it if his heart were to stop. Pt states he does not want a tube down his throat, but he would like everything else. Paged Dr Elsworth Soho via Shea Evans to request clarification. MD gave VO to change order to DNI and have the [facility] follow up in AM.

## 2019-03-15 NOTE — Progress Notes (Signed)
RT NOTES: Removed patient from bipap and placed on nasal cannula at 6lpm. RN aware.

## 2019-03-15 NOTE — Progress Notes (Signed)
   NAME:  Joshua Whitaker, MRN:  [id number], DOB:  07-02-1941, LOS: 1 ADMISSION DATE:  03/13/2019, CONSULTATION DATE:  03/14/2019 REFERRING MD:  ED provider, CHIEF COMPLAINT:  Shortness of breath   History of present illness   78 year old smoker and heavy drinker with COPD on 4 L admitted with severe hyponatremia 109 and shortness of breath for 2 weeks treated as COPD exacerbation.   - EKG showed ST elevation in inferior leads but troponins have been flat.  Cardiac cath was deferred.  Past Medical History  -COPD -Chronic hypoxic respiratory failure on 4 L of oxygen  Significant Hospital Events   -Admission 3/21  Consults:  -Cardiology  Procedures:  Echo-normal LV systolic function, small to moderate pericardial effusion  Significant Diagnostic Tests:  -BMP sodium 109, chloride 76 -BNP elevated at 383.2   Micro Data:  RVP neg  Antimicrobials:   doxycycline    SUBJ -denies chest pain, breathing is improved. He was placed on BiPAP last night and is now off Good urine output  Objective   Blood pressure (!) 137/52, pulse 86, temperature 98.3 F (36.8 C), temperature source Axillary, resp. rate 19, height 5\' 9"  (1.753 m), weight 59.7 kg, SpO2 97 %.    FiO2 (%):  [40 %] 40 %   Intake/Output Summary (Last 24 hours) at 03/15/2019 1008 Last data filed at 03/15/2019 0800 Gross per 24 hour  Intake 1283.27 ml  Output 1150 ml  Net 133.27 ml   Filed Weights   03/13/19 2345 03/14/19 0245 03/15/19 0500  Weight: 68 kg 64.9 kg 59.7 kg    Examination: Elderly man, no distress, able to speak in full sentences No pallor or icterus, no JVD Dry mucosa Faint rhonchi left, decreased on right base S1-S2 normal, regular Soft and nontender abdomen Alert, interactive, mild tremors, No edema   Assessment & Plan:  #Acute on Chronic hypoxic respiratory failure #COPD exacerbation -Brovana twice daily -Pulmicort twice daily -Scheduled duo nebs every 6 hours -Prednisone 40mg  for  5 days -Zithromax for 5 days  Out of bed to chair  #STEMI Patient found to have ST elevation in the inferior leads with elevated troponin. He has not had any chest pain  -Heparin DC'd since troponins negative, echo does not show any wall motion abnormality -ASA 81 mg daily -Plavix 75mg    #Hyponatremia  Asymptomatic Patient with hyponatremia. Baseline 128 range He is euvolemic on exam. DDX include beer portomania given his drinking history vs SIADH though felt to be unlikely. He has significant smoking history and is at risk for malignancy that could cause SIADH. He had a CT in may of last year which showed a RUL spiculated nodule that had been stable for about 2 years and was felt to be a scar  - TSH and cortisol ok -1262ml free water restriction -trend sodium Q 8h, can correct more rapidly now   #Hypertension -Continue amlodipine  #Alcohol abuse   Patient with history of heavy alcohol abuse and history of alcohol withdrawal. No reported history of alcohol withdrawal seizures. -CIWA protocol -PRN ativan for agitation and seizures -Folic acid -Thiamine -Multivitamin -Seizure precautions   DNR   Can transfer to telemetry and to [facility] 3/23  Kara Mead MD. [facility]. [facility] Pager [phone] If no response call [phone]   03/15/2019

## 2019-03-15 NOTE — Progress Notes (Signed)
Notified Dr.Sommer about critical lab, sodium 118. Will continue to monitor.

## 2019-03-15 NOTE — Progress Notes (Signed)
eLink Physician-Brief Progress Note Patient Name: GUNNARD DORRANCE DOB: Jan 05, 1941 MRN: [id number]   Date of Service  03/15/2019  HPI/Events of Note  Hyponatremia - Na+ = 117 --> 118.   eICU Interventions  Continue present management and continue to trend Na+.     Intervention Category Major Interventions: Electrolyte abnormality - evaluation and management  Krysia Zahradnik Eugene 03/15/2019, 1:39 AM

## 2019-03-16 ENCOUNTER — Other Ambulatory Visit: Payer: Self-pay

## 2019-03-16 ENCOUNTER — Encounter (HOSPITAL_COMMUNITY): Payer: Self-pay

## 2019-03-16 DIAGNOSIS — Z720 Tobacco use: Secondary | ICD-10-CM

## 2019-03-16 DIAGNOSIS — Z9119 Patient's noncompliance with other medical treatment and regimen: Secondary | ICD-10-CM

## 2019-03-16 DIAGNOSIS — Z91199 Patient's noncompliance with other medical treatment and regimen due to unspecified reason: Secondary | ICD-10-CM

## 2019-03-16 DIAGNOSIS — F1010 Alcohol abuse, uncomplicated: Secondary | ICD-10-CM

## 2019-03-16 DIAGNOSIS — J9621 Acute and chronic respiratory failure with hypoxia: Secondary | ICD-10-CM

## 2019-03-16 LAB — CBC
HCT: 35.6 % — ABNORMAL LOW (ref 39.0–52.0)
Hemoglobin: 11.8 g/dL — ABNORMAL LOW (ref 13.0–17.0)
MCH: 29.5 pg (ref 26.0–34.0)
MCHC: 33.1 g/dL (ref 30.0–36.0)
MCV: 89 fL (ref 80.0–100.0)
Platelets: 314 10*3/uL (ref 150–400)
RBC: 4 MIL/uL — ABNORMAL LOW (ref 4.22–5.81)
RDW: 13.2 % (ref 11.5–15.5)
WBC: 10.6 10*3/uL — ABNORMAL HIGH (ref 4.0–10.5)
nRBC: 0 % (ref 0.0–0.2)

## 2019-03-16 LAB — BASIC METABOLIC PANEL
Anion gap: 8 (ref 5–15)
BUN: 9 mg/dL (ref 8–23)
CO2: 25 mmol/L (ref 22–32)
Calcium: 8.2 mg/dL — ABNORMAL LOW (ref 8.9–10.3)
Chloride: 92 mmol/L — ABNORMAL LOW (ref 98–111)
Creatinine, Ser: 0.57 mg/dL — ABNORMAL LOW (ref 0.61–1.24)
GFR calc Af Amer: >60 mL/min (ref >60)
GFR calc non Af Amer: >60 mL/min (ref >60)
Glucose, Bld: 83 mg/dL (ref 70–99)
Potassium: 4 mmol/L (ref 3.5–5.1)
Sodium: 125 mmol/L — ABNORMAL LOW (ref 135–145)

## 2019-03-16 MED ORDER — ATORVASTATIN CALCIUM 80 MG PO TABS: 80.0000 mg | ORAL_TABLET | Freq: Every day | ORAL | 0 refills | Status: DC

## 2019-03-16 MED ORDER — ESCITALOPRAM OXALATE 10 MG PO TABS: 5.0000 mg | ORAL_TABLET | Freq: Every day | ORAL | Status: DC

## 2019-03-16 MED ORDER — PREDNISONE 20 MG PO TABS: 40.0000 mg | ORAL_TABLET | Freq: Every day | ORAL | 0 refills | Status: AC

## 2019-03-16 MED ORDER — THIAMINE HCL 100 MG PO TABS: 100.0000 mg | ORAL_TABLET | Freq: Every day | ORAL | 0 refills | Status: DC

## 2019-03-16 MED ORDER — FOLIC ACID 1 MG PO TABS: 1.0000 mg | ORAL_TABLET | Freq: Every day | ORAL | 0 refills | Status: DC

## 2019-03-16 MED ORDER — CLOPIDOGREL BISULFATE 75 MG PO TABS: 75.0000 mg | ORAL_TABLET | Freq: Every day | ORAL | 0 refills | Status: DC

## 2019-03-16 MED ORDER — AZITHROMYCIN 500 MG PO TABS: 500.0000 mg | ORAL_TABLET | Freq: Every day | ORAL | 0 refills | Status: AC

## 2019-03-16 NOTE — Progress Notes (Signed)
Rounded with Dr. Maylene Roes, pt and is wife will be discharged today.  Daughter is on her way to pick them up, will be here at approx 12:00.

## 2019-03-16 NOTE — Discharge Summary (Signed)
Physician Discharge Summary  Joshua Whitaker FTD:[id number] DOB: 11-25-1941 DOA: 03/13/2019  PCP: [name], MD  Admit date: 03/13/2019 Discharge date: 03/16/2019  Admitted From: Home Disposition:  Home   Recommendations for Outpatient Follow-up:  1. Follow up with PCP in 1 week 2. Follow up with Cardiology 3. Please obtain BMP in 1 week to repeat sodium level  4. Encourage alcohol and tobacco cessation   Discharge Condition: Stable CODE STATUS: Partial code  Diet recommendation: Heart healthy   Brief/Interim Summary: Joshua Whitaker is a 78 year old male with past medical history significant for COPD, alcohol abuse, tobacco abuse, chronic hyponatremia who presented to the hospital with shortness of breath.  He was admitted due to severe hyponatremia of 109, dyspnea, acute respiratory failure as well as ST elevations in the inferior leads.  He was evaluated by cardiology due to EKG change of ST elevation in inferior leads.  Troponin level has been flat 0.05, 0.04, 0.03.  Cardiac cath was deferred.  Echocardiogram without evidence of wall motion abnormalities.  He was started on Plavix, atorvastatin.  Acute on chronic hyponatremia thought to be secondary to beer potomania.  Baseline sodium levels appears to be around 128-130.  Sodium level continue to improve slowly without acute decompensation.  He was discharged home to finish azithromycin, prednisone for total 5 days for his COPD exacerbation.  He was instructed and encouraged to stop alcohol use as well as tobacco abuse.  Discussed with wife at bedside.  Discharge Diagnoses:  Principal Problem:   COPD exacerbation ([place]) Active Problems:   Essential hypertension   Hyperlipidemia   Panlobular emphysema (HCC)   Pressure injury of skin   Coronary artery calcification   Aortic atherosclerosis (HCC)   Hyponatremia   ST elevation   Elevated troponin   Acute on chronic respiratory failure with hypoxia (HCC)   Alcohol  abuse   Tobacco abuse   Medically noncompliant   Discharge Instructions  Discharge Instructions    Call MD for:  difficulty breathing, headache or visual disturbances   Complete by:  As directed    Call MD for:  extreme fatigue   Complete by:  As directed    Call MD for:  persistant dizziness or light-headedness   Complete by:  As directed    Call MD for:  persistant nausea and vomiting   Complete by:  As directed    Call MD for:  severe uncontrolled pain   Complete by:  As directed    Call MD for:  temperature >100.4   Complete by:  As directed    Diet - low sodium heart healthy   Complete by:  As directed    Discharge instructions   Complete by:  As directed    You were cared for by a hospitalist during your hospital stay. If you have any questions about your discharge medications or the care you received while you were in the hospital after you are discharged, you can call the unit and ask to speak with the hospitalist on call if the hospitalist that took care of you is not available. Once you are discharged, your primary care physician will handle any further medical issues. Please note that NO REFILLS for any discharge medications will be authorized once you are discharged, as it is imperative that you return to your primary care physician (or establish a relationship with a primary care physician if you do not have one) for your aftercare needs so that they can reassess your need for  medications and monitor your lab values.   Increase activity slowly   Complete by:  As directed      Allergies as of 03/16/2019      Reactions   Tiotropium Bromide Monohydrate Other (See Comments)   Severe urinary retention   Varenicline Other (See Comments)   Psychiatric and dreams   Iodinated Diagnostic Agents    Statins       Medication List    STOP taking these medications   amLODipine 5 MG tablet Commonly known as:  NORVASC   predniSONE 10 MG (21) Tbpk tablet Commonly known as:   STERAPRED UNI-PAK 21 TAB Replaced by:  predniSONE 20 MG tablet   triamcinolone 0.1 % cream : eucerin Crea     TAKE these medications   atorvastatin 80 MG tablet Commonly known as:  LIPITOR Take 1 tablet (80 mg total) by mouth daily at 6 PM.   azithromycin 500 MG tablet Commonly known as:  Zithromax Take 1 tablet (500 mg total) by mouth daily for 2 days. What changed:    medication strength  how much to take  how to take this  when to take this  additional instructions   cholestyramine 4 g packet Commonly known as:  QUESTRAN   clopidogrel 75 MG tablet Commonly known as:  PLAVIX Take 1 tablet (75 mg total) by mouth daily. Start taking on:  March 17, 2019   escitalopram 5 MG tablet Commonly known as:  LEXAPRO Take 5 mg by mouth every morning.   fluticasone 50 MCG/ACT nasal spray Commonly known as:  FLONASE Place 1 spray into both nostrils daily.   Fluticasone-Umeclidin-Vilant 100-62.5-25 MCG/INH Aepb Commonly known as:  Trelegy Ellipta Inhale 1 puff into the lungs daily.   folic acid 1 MG tablet Commonly known as:  FOLVITE Take 1 tablet (1 mg total) by mouth daily. Start taking on:  March 17, 2019   Ipratropium-Albuterol 20-100 MCG/ACT Aers respimat Commonly known as:  Combivent Respimat Inhale 2 puffs into the lungs every 6 (six) hours.   predniSONE 20 MG tablet Commonly known as:  DELTASONE Take 2 tablets (40 mg total) by mouth daily with breakfast for 2 days. Start taking on:  March 17, 2019 Replaces:  predniSONE 10 MG (21) Tbpk tablet   ProAir HFA 108 (90 Base) MCG/ACT inhaler Generic drug:  albuterol Inhale 1-2 puffs into the lungs every 6 (six) hours as needed for wheezing or shortness of breath. What changed:  See the new instructions.   thiamine 100 MG tablet Take 1 tablet (100 mg total) by mouth daily. Start taking on:  March 17, 2019      Follow-up Information    Stacks, Cletus Gash, MD. Schedule an appointment as soon as possible for a visit  in 1 week(s).   Specialty:  Family Medicine Why:  Need repeat BMP this week to recheck sodium level  Contact information: [address] [place] [address] [phone]        Jerline Pain, MD Follow up.   Specialty:  Cardiology Contact information: [address] [place] [address] [phone]          Allergies  Allergen Reactions  . Tiotropium Bromide Monohydrate Other (See Comments)    Severe urinary retention  . Varenicline Other (See Comments)    Psychiatric and dreams  . Iodinated Diagnostic Agents   . Statins     Consultations:  PCCM admit  Cardiology    Procedures/Studies: Dg Chest [facility]  Result Date: 03/13/2019 CLINICAL DATA:  Cough and chronic shortness of breath worse today, dyspnea EXAM: PORTABLE CHEST 1 VIEW COMPARISON:  01/06/2019 FINDINGS: Mild enlargement of cardiac silhouette. Atherosclerotic calcification aorta. Mediastinal contours and pulmonary vascularity normal. Emphysematous and mild bronchitic changes consistent with COPD. Scarring in LEFT upper lobe and minimally at LEFT base. No acute infiltrate, pleural effusion or pneumothorax. Bones demineralized. IMPRESSION: COPD changes without acute infiltrate. Mild enlargement of cardiac silhouette. Electronically Signed   By: Lavonia Dana M.D.   On: 03/13/2019 23:17    Echocardiogram  IMPRESSIONS  1. The left ventricle has low normal systolic function, with an ejection fraction of 50-55%. The cavity size was normal. Left ventricular diastolic Doppler parameters are consistent with pseudonormal. Elevated mean left atrial pressure No evidence of  left ventricular regional wall motion abnormalities.  2. Small-to-moderate circumferential pericardial effusion, without evidence of tamponade.  3. Increased respiratory flow variation appears related to increased work of breathing, rather than tamponade. The inferior vena cava collapses readily with breathing.  4. Pulmonic valve  regurgitation was not assessed by color flow Doppler.  5. The inferior vena cava was dilated in size with >50% respiratory variability.   Discharge Exam: Vitals:   03/15/19 1501 03/16/19 0559  BP:  (!) 158/65  Pulse:  77  Resp:  18  Temp:  98.2 F (36.8 C)  SpO2: 97% 94%    General: Pt is alert, awake, not in acute distress Cardiovascular: RRR, S1/S2 +, no rubs, no gallops Respiratory: Hyperinflated chest, diminished breath sounds without wheeze or respiratory distress Abdominal: Soft, NT, ND, bowel sounds + Extremities: no edema, no cyanosis    The results of significant diagnostics from this hospitalization (including imaging, microbiology, ancillary and laboratory) are listed below for reference.     Microbiology: Recent Results (from the past 240 hour(s))  Respiratory Panel by PCR     Status: None   Collection Time: 03/14/19  1:45 AM  Result Value Ref Range Status   Adenovirus NOT DETECTED NOT DETECTED Final   Coronavirus 229E NOT DETECTED NOT DETECTED Final    Comment: (NOTE) The Coronavirus on the Respiratory Panel, DOES NOT test for the novel  Coronavirus (2019 nCoV)    Coronavirus HKU1 NOT DETECTED NOT DETECTED Final   Coronavirus NL63 NOT DETECTED NOT DETECTED Final   Coronavirus OC43 NOT DETECTED NOT DETECTED Final   Metapneumovirus NOT DETECTED NOT DETECTED Final   Rhinovirus / Enterovirus NOT DETECTED NOT DETECTED Final   Influenza A NOT DETECTED NOT DETECTED Final   Influenza B NOT DETECTED NOT DETECTED Final   Parainfluenza Virus 1 NOT DETECTED NOT DETECTED Final   Parainfluenza Virus 2 NOT DETECTED NOT DETECTED Final   Parainfluenza Virus 3 NOT DETECTED NOT DETECTED Final   Parainfluenza Virus 4 NOT DETECTED NOT DETECTED Final   Respiratory Syncytial Virus NOT DETECTED NOT DETECTED Final   Bordetella pertussis NOT DETECTED NOT DETECTED Final   Chlamydophila pneumoniae NOT DETECTED NOT DETECTED Final   Mycoplasma pneumoniae NOT DETECTED NOT DETECTED  Final    Comment: Performed at [facility], [address], [place], [place] [address]  MRSA PCR Screening     Status: None   Collection Time: 03/14/19  2:49 AM  Result Value Ref Range Status   MRSA by PCR NEGATIVE NEGATIVE Final    Comment:        The GeneXpert MRSA Assay (FDA approved for NASAL specimens only), is one component of a comprehensive MRSA colonization surveillance program. It  is not intended to diagnose MRSA infection nor to guide or monitor treatment for MRSA infections. Performed at [facility], [place] [address] [place], [place] [address]      Labs: BNP (last 3 results) Recent Labs    03/13/19 2304  BNP 604.5*   Basic Metabolic Panel: Recent Labs  Lab 03/13/19 2304 03/14/19 0417  03/14/19 2016 03/15/19 0026 03/15/19 0420 03/15/19 1556 03/16/19 0532  NA 109* 112*   < > 117* 118* 120* 124* 125*  K 4.8 4.5  --   --   --  5.1 4.6 4.0  CL 76* 81*  --   --   --  86* 92* 92*  CO2 21* 20*  --   --   --  25 25 25   GLUCOSE 133* 129*  --   --   --  104* 120* 83  BUN 14 13  --   --   --  14 12 9   CREATININE 0.68 0.63  --   --   --  0.64 0.64 0.57*  CALCIUM 7.7* 7.9*  --   --   --  8.2* 8.2* 8.2*   < > = values in this interval not displayed.   Liver Function Tests: Recent Labs  Lab 03/13/19 2304  AST 39  ALT 41  ALKPHOS 150*  BILITOT 0.7  PROT 5.9*  ALBUMIN 2.8*   No results for input(s): LIPASE, AMYLASE in the last 168 hours. No results for input(s): AMMONIA in the last 168 hours. CBC: Recent Labs  Lab 03/13/19 2304 03/14/19 0207 03/14/19 0417 03/15/19 0420 03/16/19 0532  WBC 14.7* 13.2* 12.9* 15.7* 10.6*  NEUTROABS 12.1*  --   --   --   --   HGB 13.2 12.4* 12.4* 12.1* 11.8*  HCT 37.4* 35.9* 34.5* 34.2* 35.6*  MCV 85.8 86.5 85.8 87.2 89.0  PLT 335 279 286 290 314   Cardiac Enzymes: Recent Labs  Lab 03/13/19 2304 03/14/19 0417 03/14/19 0743 03/14/19 1201  TROPONINI 0.06* 0.05* 0.04* 0.03*   BNP: Invalid input(s):  POCBNP CBG: Recent Labs  Lab 03/14/19 0244 03/14/19 0731  GLUCAP 120* 149*   D-Dimer No results for input(s): DDIMER in the last 72 hours. Hgb A1c Recent Labs    03/14/19 0417  HGBA1C 5.1   Lipid Profile Recent Labs    03/14/19 0417  CHOL 104  HDL 51  LDLCALC 48  TRIG 26  CHOLHDL 2.0   Thyroid function studies Recent Labs    03/14/19 0207  TSH 1.121   Anemia work up No results for input(s): VITAMINB12, FOLATE, FERRITIN, TIBC, IRON, RETICCTPCT in the last 72 hours. Urinalysis No results found for: COLORURINE, APPEARANCEUR, [place], [place], [place], [place], [place], [place], PROTEINUR, UROBILINOGEN, NITRITE, LEUKOCYTESUR Sepsis Labs Invalid input(s): PROCALCITONIN,  WBC,  LACTICIDVEN Microbiology Recent Results (from the past 240 hour(s))  Respiratory Panel by PCR     Status: None   Collection Time: 03/14/19  1:45 AM  Result Value Ref Range Status   Adenovirus NOT DETECTED NOT DETECTED Final   Coronavirus 229E NOT DETECTED NOT DETECTED Final    Comment: (NOTE) The Coronavirus on the Respiratory Panel, DOES NOT test for the novel  Coronavirus (2019 nCoV)    Coronavirus HKU1 NOT DETECTED NOT DETECTED Final   Coronavirus NL63 NOT DETECTED NOT DETECTED Final   Coronavirus OC43 NOT DETECTED NOT DETECTED Final   Metapneumovirus NOT DETECTED NOT DETECTED Final   Rhinovirus / Enterovirus NOT DETECTED NOT DETECTED Final   Influenza  A NOT DETECTED NOT DETECTED Final   Influenza B NOT DETECTED NOT DETECTED Final   Parainfluenza Virus 1 NOT DETECTED NOT DETECTED Final   Parainfluenza Virus 2 NOT DETECTED NOT DETECTED Final   Parainfluenza Virus 3 NOT DETECTED NOT DETECTED Final   Parainfluenza Virus 4 NOT DETECTED NOT DETECTED Final   Respiratory Syncytial Virus NOT DETECTED NOT DETECTED Final   Bordetella pertussis NOT DETECTED NOT DETECTED Final   Chlamydophila pneumoniae NOT DETECTED NOT DETECTED Final   Mycoplasma pneumoniae NOT DETECTED NOT DETECTED Final     Comment: Performed at [facility], [place] [address] [place], [place] [address]  MRSA PCR Screening     Status: None   Collection Time: 03/14/19  2:49 AM  Result Value Ref Range Status   MRSA by PCR NEGATIVE NEGATIVE Final    Comment:        The GeneXpert MRSA Assay (FDA approved for NASAL specimens only), is one component of a comprehensive MRSA colonization surveillance program. It is not intended to diagnose MRSA infection nor to guide or monitor treatment for MRSA infections. Performed at [facility], [place] [address] [place], [place] [address]      Patient was seen and examined on the day of discharge and was found to be in stable condition. Time coordinating discharge: 45 minutes including assessment and coordination of care, as well as examination of the patient.   SIGNED:  Dessa Phi, DO [facility] [email or web address] 03/16/2019, 2:54 PM

## 2019-03-19 ENCOUNTER — Telehealth: Payer: Self-pay | Admitting: Family Medicine

## 2019-03-19 NOTE — Telephone Encounter (Signed)
appt scheduled for telephone visit

## 2019-03-23 ENCOUNTER — Encounter: Payer: Self-pay | Admitting: Family Medicine

## 2019-03-23 ENCOUNTER — Ambulatory Visit (INDEPENDENT_AMBULATORY_CARE_PROVIDER_SITE_OTHER): Payer: Medicare Other | Admitting: Family Medicine

## 2019-03-23 ENCOUNTER — Other Ambulatory Visit: Payer: Self-pay

## 2019-03-23 DIAGNOSIS — F1010 Alcohol abuse, uncomplicated: Secondary | ICD-10-CM | POA: Diagnosis not present

## 2019-03-23 DIAGNOSIS — E871 Hypo-osmolality and hyponatremia: Secondary | ICD-10-CM | POA: Diagnosis not present

## 2019-03-23 DIAGNOSIS — J441 Chronic obstructive pulmonary disease with (acute) exacerbation: Secondary | ICD-10-CM

## 2019-03-23 MED ORDER — MEGESTROL ACETATE 400 MG/10ML PO SUSP: 400.0000 mg | Freq: Two times a day (BID) | ORAL | 2 refills | Status: DC

## 2019-03-23 MED ORDER — PREDNISONE 10 MG PO TABS: ORAL_TABLET | ORAL | 0 refills | Status: DC

## 2019-03-23 NOTE — Progress Notes (Signed)
Subjective:  Patient ID: Joshua Whitaker, male    DOB: 01-06-1941  Age: 78 y.o. MRN: [id number]  CC: No chief complaint on file.   HPI Joshua Whitaker presents for hospital follow up for hyponatremia. History given by daughter. Purported cause is alcoholism. He wasn't eating much., drinking heavily when symptoms started. He continues to have decreased mobility, decreased appetite. Poor balance. No falls , but family assists for all ambulation. He is Incontinent of urine.  Put on BP med at hospital. They are afraid to bring him in due to immobility aand the COVID virus. Request home care..    Depression screen [facility] 2/9 10/15/2018 09/10/2018 07/08/2018  Decreased Interest 0 1 0  Down, Depressed, Hopeless 0 1 0  PHQ - 2 Score 0 2 0  Altered sleeping - 0 -  Tired, decreased energy - 0 -  Change in appetite - 0 -  Feeling bad or failure about yourself  - 0 -  Trouble concentrating - 0 -  Moving slowly or fidgety/restless - 0 -  Suicidal thoughts - 0 -  PHQ-9 Score - 2 -    History Joshua Whitaker has a past medical history of Allergy, Anxiety, Asthma, Cancer ([place]) (2007), COPD (chronic obstructive pulmonary disease) ([place]), Depression, and Prostate cancer ([place]).   He has a past surgical history that includes Eye surgery; Prostate surgery; and Spine surgery.   His family history includes Alcohol abuse in his daughter; Arthritis in his mother; Cancer in his brother, brother, brother, sister, sister, and sister; Chromosomal disorder in his sister; Diabetes in his sister; Heart disease in his father and mother; Rheum arthritis in his daughter and daughter; Stroke in his brother.He reports that he has been smoking cigarettes. He has been smoking about 1.00 pack per day. He has never used smokeless tobacco. He reports current alcohol use. He reports that he does not use drugs.    ROS Review of Systems  Constitutional: Positive for activity change, appetite change and fatigue.  HENT: Negative.    Eyes: Negative for visual disturbance.  Respiratory: Negative for cough and shortness of breath.   Cardiovascular: Negative for chest pain and leg swelling.  Gastrointestinal: Negative for abdominal pain, diarrhea, nausea and vomiting.  Genitourinary: Negative for difficulty urinating.  Musculoskeletal: Positive for gait problem and myalgias. Negative for arthralgias.  Skin: Negative for rash.  Neurological: Negative for headaches. Weakness: severe, nonfocal.  Psychiatric/Behavioral: Negative for confusion, dysphoric mood and sleep disturbance.    Objective:  There were no vitals taken for this visit.  BP Readings from Last 3 Encounters:  03/16/19 (!) 158/65  01/06/19 (!) 164/72  10/15/18 129/62    Wt Readings from Last 3 Encounters:  03/16/19 141 lb 15.6 oz (64.4 kg)  01/06/19 139 lb (63 kg)  10/15/18 143 lb (64.9 kg)     Physical Exam  Phone visit  Assessment & Plan:   Diagnoses and all orders for this visit:  Hyponatremia -     Face-to-face encounter (required for Medicare/Medicaid patients)  COPD exacerbation ([place]) -     Face-to-face encounter (required for Medicare/Medicaid patients)  Alcohol abuse -     Face-to-face encounter (required for Medicare/Medicaid patients)  Other orders -     predniSONE (DELTASONE) 10 MG tablet; Take 5 daily for 3 days followed by 4,3,2 and 1 for 3 days each. -     megestrol (MEGACE) 400 MG/10ML suspension; Take 10 mLs (400 mg total) by mouth 2 (two) times daily. For appetite stimulation  I am having Joshua Whitaker start on predniSONE and megestrol. I am also having him maintain his cholestyramine, Fluticasone-Umeclidin-Vilant, Ipratropium-Albuterol, fluticasone, ProAir HFA, escitalopram, atorvastatin, clopidogrel, folic acid, and thiamine.  Allergies as of 03/23/2019      Reactions   Tiotropium Bromide Monohydrate Other (See Comments)   Severe urinary retention   Varenicline Other (See Comments)   Psychiatric and  dreams   Iodinated Diagnostic Agents    Statins       Medication List       Accurate as of March 23, 2019  3:34 PM. Always use your most recent med list.        atorvastatin 80 MG tablet Commonly known as:  LIPITOR Take 1 tablet (80 mg total) by mouth daily at 6 PM.   cholestyramine 4 g packet Commonly known as:  QUESTRAN   clopidogrel 75 MG tablet Commonly known as:  PLAVIX Take 1 tablet (75 mg total) by mouth daily.   escitalopram 5 MG tablet Commonly known as:  LEXAPRO Take 5 mg by mouth every morning.   fluticasone 50 MCG/ACT nasal spray Commonly known as:  FLONASE Place 1 spray into both nostrils daily.   Fluticasone-Umeclidin-Vilant 100-62.5-25 MCG/INH Aepb Commonly known as:  Trelegy Ellipta Inhale 1 puff into the lungs daily.   folic acid 1 MG tablet Commonly known as:  FOLVITE Take 1 tablet (1 mg total) by mouth daily.   Ipratropium-Albuterol 20-100 MCG/ACT Aers respimat Commonly known as:  Combivent Respimat Inhale 2 puffs into the lungs every 6 (six) hours.   megestrol 400 MG/10ML suspension Commonly known as:  MEGACE Take 10 mLs (400 mg total) by mouth 2 (two) times daily. For appetite stimulation   predniSONE 10 MG tablet Commonly known as:  DELTASONE Take 5 daily for 3 days followed by 4,3,2 and 1 for 3 days each.   ProAir HFA 108 (90 Base) MCG/ACT inhaler Generic drug:  albuterol Inhale 1-2 puffs into the lungs every 6 (six) hours as needed for wheezing or shortness of breath.   thiamine 100 MG tablet Take 1 tablet (100 mg total) by mouth daily.      Virtual Visit via telephone Note  I discussed the limitations, risks, security and privacy concerns of performing an evaluation and management service by telephone and the availability of in person appointments. I also discussed with the patient that there may be a patient responsible charge related to this service. The patient expressed understanding and agreed to proceed.  Follow Up  Instructions:   I discussed the assessment and treatment plan with the patient. The patient was provided an opportunity to ask questions and all were answered. The patient agreed with the plan and demonstrated an understanding of the instructions.   The patient was advised to call back or seek an in-person evaluation if the symptoms worsen or if the condition fails to improve as anticipated.  Call started: 1:36 Call ended:  1:47 Call minutes: 11   Follow-up: Return in about 1 month (around 04/23/2019), or if symptoms worsen or fail to improve.  [name], M.D.

## 2019-03-24 ENCOUNTER — Encounter: Payer: Self-pay | Admitting: Family Medicine

## 2019-03-25 ENCOUNTER — Other Ambulatory Visit: Payer: Self-pay

## 2019-03-25 MED ORDER — ATORVASTATIN CALCIUM 80 MG PO TABS: 80.0000 mg | ORAL_TABLET | Freq: Every day | ORAL | 1 refills | Status: DC

## 2019-03-26 ENCOUNTER — Telehealth: Payer: Self-pay | Admitting: Family Medicine

## 2019-03-26 NOTE — Telephone Encounter (Signed)
Please review and advise.

## 2019-03-27 NOTE — Telephone Encounter (Signed)
I entered the referral a few days ago. Please check on its status with Debbie or Jan and let the pt. Know.

## 2019-03-28 ENCOUNTER — Encounter: Payer: Self-pay | Admitting: Family Medicine

## 2019-03-30 ENCOUNTER — Emergency Department (HOSPITAL_COMMUNITY): Payer: Medicare Other

## 2019-03-30 ENCOUNTER — Other Ambulatory Visit: Payer: Self-pay

## 2019-03-30 ENCOUNTER — Inpatient Hospital Stay (HOSPITAL_COMMUNITY)
Admission: EM | Admit: 2019-03-30 | Discharge: 2019-04-11 | DRG: 208 | Disposition: A | Discharge status: Rehabilitation Facility | Payer: Medicare Other | Attending: Internal Medicine | Admitting: Internal Medicine

## 2019-03-30 ENCOUNTER — Encounter (HOSPITAL_COMMUNITY): Payer: Self-pay

## 2019-03-30 DIAGNOSIS — I483 Typical atrial flutter: Secondary | ICD-10-CM | POA: Diagnosis not present

## 2019-03-30 DIAGNOSIS — Z978 Presence of other specified devices: Secondary | ICD-10-CM | POA: Diagnosis not present

## 2019-03-30 DIAGNOSIS — E46 Unspecified protein-calorie malnutrition: Secondary | ICD-10-CM | POA: Diagnosis not present

## 2019-03-30 DIAGNOSIS — Z20828 Contact with and (suspected) exposure to other viral communicable diseases: Secondary | ICD-10-CM | POA: Diagnosis present

## 2019-03-30 DIAGNOSIS — Y95 Nosocomial condition: Secondary | ICD-10-CM | POA: Diagnosis present

## 2019-03-30 DIAGNOSIS — J441 Chronic obstructive pulmonary disease with (acute) exacerbation: Secondary | ICD-10-CM | POA: Diagnosis present

## 2019-03-30 DIAGNOSIS — F329 Major depressive disorder, single episode, unspecified: Secondary | ICD-10-CM | POA: Diagnosis present

## 2019-03-30 DIAGNOSIS — E876 Hypokalemia: Secondary | ICD-10-CM

## 2019-03-30 DIAGNOSIS — Z7902 Long term (current) use of antithrombotics/antiplatelets: Secondary | ICD-10-CM | POA: Diagnosis not present

## 2019-03-30 DIAGNOSIS — R0600 Dyspnea, unspecified: Secondary | ICD-10-CM

## 2019-03-30 DIAGNOSIS — Z79899 Other long term (current) drug therapy: Secondary | ICD-10-CM | POA: Diagnosis not present

## 2019-03-30 DIAGNOSIS — R0602 Shortness of breath: Secondary | ICD-10-CM

## 2019-03-30 DIAGNOSIS — E871 Hypo-osmolality and hyponatremia: Secondary | ICD-10-CM | POA: Diagnosis not present

## 2019-03-30 DIAGNOSIS — Z823 Family history of stroke: Secondary | ICD-10-CM

## 2019-03-30 DIAGNOSIS — E43 Unspecified severe protein-calorie malnutrition: Secondary | ICD-10-CM | POA: Diagnosis not present

## 2019-03-30 DIAGNOSIS — Z9981 Dependence on supplemental oxygen: Secondary | ICD-10-CM

## 2019-03-30 DIAGNOSIS — I4892 Unspecified atrial flutter: Secondary | ICD-10-CM | POA: Diagnosis not present

## 2019-03-30 DIAGNOSIS — R740 Nonspecific elevation of levels of transaminase and lactic acid dehydrogenase [LDH]: Secondary | ICD-10-CM | POA: Diagnosis not present

## 2019-03-30 DIAGNOSIS — J430 Unilateral pulmonary emphysema [MacLeod's syndrome]: Secondary | ICD-10-CM | POA: Diagnosis not present

## 2019-03-30 DIAGNOSIS — Z8546 Personal history of malignant neoplasm of prostate: Secondary | ICD-10-CM

## 2019-03-30 DIAGNOSIS — E785 Hyperlipidemia, unspecified: Secondary | ICD-10-CM | POA: Diagnosis not present

## 2019-03-30 DIAGNOSIS — Z7951 Long term (current) use of inhaled steroids: Secondary | ICD-10-CM

## 2019-03-30 DIAGNOSIS — I248 Other forms of acute ischemic heart disease: Secondary | ICD-10-CM | POA: Diagnosis not present

## 2019-03-30 DIAGNOSIS — J410 Simple chronic bronchitis: Secondary | ICD-10-CM | POA: Diagnosis not present

## 2019-03-30 DIAGNOSIS — J449 Chronic obstructive pulmonary disease, unspecified: Secondary | ICD-10-CM | POA: Diagnosis not present

## 2019-03-30 DIAGNOSIS — Z7189 Other specified counseling: Secondary | ICD-10-CM

## 2019-03-30 DIAGNOSIS — R531 Weakness: Secondary | ICD-10-CM | POA: Diagnosis not present

## 2019-03-30 DIAGNOSIS — E440 Moderate protein-calorie malnutrition: Secondary | ICD-10-CM | POA: Diagnosis not present

## 2019-03-30 DIAGNOSIS — I10 Essential (primary) hypertension: Secondary | ICD-10-CM | POA: Diagnosis present

## 2019-03-30 DIAGNOSIS — F419 Anxiety disorder, unspecified: Secondary | ICD-10-CM | POA: Diagnosis present

## 2019-03-30 DIAGNOSIS — R627 Adult failure to thrive: Secondary | ICD-10-CM | POA: Diagnosis not present

## 2019-03-30 DIAGNOSIS — R918 Other nonspecific abnormal finding of lung field: Secondary | ICD-10-CM | POA: Diagnosis not present

## 2019-03-30 DIAGNOSIS — B370 Candidal stomatitis: Secondary | ICD-10-CM | POA: Diagnosis present

## 2019-03-30 DIAGNOSIS — R778 Other specified abnormalities of plasma proteins: Secondary | ICD-10-CM

## 2019-03-30 DIAGNOSIS — Z811 Family history of alcohol abuse and dependence: Secondary | ICD-10-CM

## 2019-03-30 DIAGNOSIS — J189 Pneumonia, unspecified organism: Secondary | ICD-10-CM | POA: Diagnosis not present

## 2019-03-30 DIAGNOSIS — I2510 Atherosclerotic heart disease of native coronary artery without angina pectoris: Secondary | ICD-10-CM | POA: Diagnosis not present

## 2019-03-30 DIAGNOSIS — J90 Pleural effusion, not elsewhere classified: Secondary | ICD-10-CM | POA: Diagnosis not present

## 2019-03-30 DIAGNOSIS — Z833 Family history of diabetes mellitus: Secondary | ICD-10-CM

## 2019-03-30 DIAGNOSIS — Z7952 Long term (current) use of systemic steroids: Secondary | ICD-10-CM | POA: Diagnosis not present

## 2019-03-30 DIAGNOSIS — R5381 Other malaise: Secondary | ICD-10-CM | POA: Diagnosis not present

## 2019-03-30 DIAGNOSIS — R7989 Other specified abnormal findings of blood chemistry: Secondary | ICD-10-CM | POA: Diagnosis not present

## 2019-03-30 DIAGNOSIS — I4891 Unspecified atrial fibrillation: Secondary | ICD-10-CM | POA: Diagnosis present

## 2019-03-30 DIAGNOSIS — G9341 Metabolic encephalopathy: Secondary | ICD-10-CM | POA: Diagnosis not present

## 2019-03-30 DIAGNOSIS — J181 Lobar pneumonia, unspecified organism: Secondary | ICD-10-CM

## 2019-03-30 DIAGNOSIS — J9622 Acute and chronic respiratory failure with hypercapnia: Secondary | ICD-10-CM | POA: Diagnosis not present

## 2019-03-30 DIAGNOSIS — Z7901 Long term (current) use of anticoagulants: Secondary | ICD-10-CM

## 2019-03-30 DIAGNOSIS — Z515 Encounter for palliative care: Secondary | ICD-10-CM | POA: Diagnosis not present

## 2019-03-30 DIAGNOSIS — R0603 Acute respiratory distress: Secondary | ICD-10-CM | POA: Diagnosis not present

## 2019-03-30 DIAGNOSIS — R7401 Elevation of levels of liver transaminase levels: Secondary | ICD-10-CM

## 2019-03-30 DIAGNOSIS — F17210 Nicotine dependence, cigarettes, uncomplicated: Secondary | ICD-10-CM | POA: Diagnosis present

## 2019-03-30 DIAGNOSIS — Z01818 Encounter for other preprocedural examination: Secondary | ICD-10-CM

## 2019-03-30 DIAGNOSIS — Z4689 Encounter for fitting and adjustment of other specified devices: Secondary | ICD-10-CM | POA: Diagnosis not present

## 2019-03-30 DIAGNOSIS — I313 Pericardial effusion (noninflammatory): Secondary | ICD-10-CM | POA: Diagnosis not present

## 2019-03-30 DIAGNOSIS — I6782 Cerebral ischemia: Secondary | ICD-10-CM | POA: Diagnosis not present

## 2019-03-30 DIAGNOSIS — J9621 Acute and chronic respiratory failure with hypoxia: Secondary | ICD-10-CM | POA: Diagnosis present

## 2019-03-30 DIAGNOSIS — Z0189 Encounter for other specified special examinations: Secondary | ICD-10-CM

## 2019-03-30 DIAGNOSIS — Z9049 Acquired absence of other specified parts of digestive tract: Secondary | ICD-10-CM

## 2019-03-30 DIAGNOSIS — Z4659 Encounter for fitting and adjustment of other gastrointestinal appliance and device: Secondary | ICD-10-CM

## 2019-03-30 DIAGNOSIS — F1020 Alcohol dependence, uncomplicated: Secondary | ICD-10-CM | POA: Diagnosis present

## 2019-03-30 DIAGNOSIS — F1010 Alcohol abuse, uncomplicated: Secondary | ICD-10-CM | POA: Diagnosis not present

## 2019-03-30 DIAGNOSIS — J690 Pneumonitis due to inhalation of food and vomit: Secondary | ICD-10-CM | POA: Diagnosis not present

## 2019-03-30 DIAGNOSIS — Z681 Body mass index (BMI) 19 or less, adult: Secondary | ICD-10-CM

## 2019-03-30 DIAGNOSIS — Z8261 Family history of arthritis: Secondary | ICD-10-CM

## 2019-03-30 DIAGNOSIS — G9340 Encephalopathy, unspecified: Secondary | ICD-10-CM | POA: Diagnosis not present

## 2019-03-30 DIAGNOSIS — J439 Emphysema, unspecified: Secondary | ICD-10-CM | POA: Diagnosis not present

## 2019-03-30 DIAGNOSIS — Z7982 Long term (current) use of aspirin: Secondary | ICD-10-CM | POA: Diagnosis not present

## 2019-03-30 DIAGNOSIS — I2584 Coronary atherosclerosis due to calcified coronary lesion: Secondary | ICD-10-CM | POA: Diagnosis not present

## 2019-03-30 DIAGNOSIS — I3139 Other pericardial effusion (noninflammatory): Secondary | ICD-10-CM | POA: Diagnosis present

## 2019-03-30 DIAGNOSIS — Z809 Family history of malignant neoplasm, unspecified: Secondary | ICD-10-CM

## 2019-03-30 DIAGNOSIS — R64 Cachexia: Secondary | ICD-10-CM | POA: Diagnosis not present

## 2019-03-30 DIAGNOSIS — R0689 Other abnormalities of breathing: Secondary | ICD-10-CM | POA: Diagnosis not present

## 2019-03-30 DIAGNOSIS — Z931 Gastrostomy status: Secondary | ICD-10-CM | POA: Diagnosis not present

## 2019-03-30 DIAGNOSIS — J9620 Acute and chronic respiratory failure, unspecified whether with hypoxia or hypercapnia: Secondary | ICD-10-CM | POA: Diagnosis present

## 2019-03-30 DIAGNOSIS — Z9911 Dependence on respirator [ventilator] status: Secondary | ICD-10-CM | POA: Diagnosis not present

## 2019-03-30 DIAGNOSIS — R6889 Other general symptoms and signs: Secondary | ICD-10-CM | POA: Diagnosis not present

## 2019-03-30 DIAGNOSIS — R000 Tachycardia, unspecified: Secondary | ICD-10-CM | POA: Diagnosis not present

## 2019-03-30 DIAGNOSIS — Z1159 Encounter for screening for other viral diseases: Secondary | ICD-10-CM

## 2019-03-30 DIAGNOSIS — F17200 Nicotine dependence, unspecified, uncomplicated: Secondary | ICD-10-CM | POA: Diagnosis not present

## 2019-03-30 DIAGNOSIS — Z8249 Family history of ischemic heart disease and other diseases of the circulatory system: Secondary | ICD-10-CM

## 2019-03-30 HISTORY — DX: Alcohol abuse, uncomplicated: F10.10

## 2019-03-30 HISTORY — DX: Hypo-osmolality and hyponatremia: E87.1

## 2019-03-30 HISTORY — DX: Atherosclerotic heart disease of native coronary artery without angina pectoris: I25.10

## 2019-03-30 LAB — BASIC METABOLIC PANEL
Anion gap: 11 (ref 5–15)
BUN: 31 mg/dL — ABNORMAL HIGH (ref 8–23)
CO2: 24 mmol/L (ref 22–32)
Calcium: 8.5 mg/dL — ABNORMAL LOW (ref 8.9–10.3)
Chloride: 105 mmol/L (ref 98–111)
Creatinine, Ser: 0.83 mg/dL (ref 0.61–1.24)
GFR calc Af Amer: >60 mL/min (ref >60)
GFR calc non Af Amer: >60 mL/min (ref >60)
Glucose, Bld: 91 mg/dL (ref 70–99)
Potassium: 4.5 mmol/L (ref 3.5–5.1)
Sodium: 140 mmol/L (ref 135–145)

## 2019-03-30 LAB — HEPATIC FUNCTION PANEL
ALT: 92 U/L — ABNORMAL HIGH (ref 0–44)
AST: 74 U/L — ABNORMAL HIGH (ref 15–41)
Albumin: 3 g/dL — ABNORMAL LOW (ref 3.5–5.0)
Alkaline Phosphatase: 117 U/L (ref 38–126)
Bilirubin, Direct: 0.1 mg/dL (ref 0.0–0.2)
Indirect Bilirubin: 0.2 mg/dL — ABNORMAL LOW (ref 0.3–0.9)
Total Bilirubin: 0.3 mg/dL (ref 0.3–1.2)
Total Protein: 6.3 g/dL — ABNORMAL LOW (ref 6.5–8.1)

## 2019-03-30 LAB — CBC
HCT: 38.3 % — ABNORMAL LOW (ref 39.0–52.0)
Hemoglobin: 12.3 g/dL — ABNORMAL LOW (ref 13.0–17.0)
MCH: 30.5 pg (ref 26.0–34.0)
MCHC: 32.1 g/dL (ref 30.0–36.0)
MCV: 95 fL (ref 80.0–100.0)
Platelets: 350 10*3/uL (ref 150–400)
RBC: 4.03 MIL/uL — ABNORMAL LOW (ref 4.22–5.81)
RDW: 14 % (ref 11.5–15.5)
WBC: 21.4 10*3/uL — ABNORMAL HIGH (ref 4.0–10.5)
nRBC: 0 % (ref 0.0–0.2)

## 2019-03-30 LAB — APTT: aPTT: 33 seconds (ref 24–36)

## 2019-03-30 LAB — TROPONIN I
Troponin I: 1.12 ng/mL (ref <0.03)
Troponin I: 1.14 ng/mL (ref <0.03)
Troponin I: 1.45 ng/mL (ref <0.03)

## 2019-03-30 LAB — MRSA PCR SCREENING: MRSA by PCR: NEGATIVE

## 2019-03-30 LAB — ETHANOL: Alcohol, Ethyl (B): <10 mg/dL (ref <10)

## 2019-03-30 LAB — GLUCOSE, CAPILLARY: Glucose-Capillary: 80 mg/dL (ref 70–99)

## 2019-03-30 LAB — HEPARIN LEVEL (UNFRACTIONATED): Heparin Unfractionated: <0.1 IU/mL — ABNORMAL LOW (ref 0.30–0.70)

## 2019-03-30 MED ORDER — ACETAMINOPHEN 650 MG RE SUPP: 650.0000 mg | Freq: Four times a day (QID) | RECTAL | Status: DC | PRN

## 2019-03-30 MED ORDER — HEPARIN (PORCINE) 25000 UT/250ML-% IV SOLN
1100.0000 [IU]/h | INTRAVENOUS | Status: DC
  Administered 2019-03-30: 900 [IU]/h via INTRAVENOUS
  Filled 2019-03-30: qty 250

## 2019-03-30 MED ORDER — ORAL CARE MOUTH RINSE
15.0000 mL | Freq: Two times a day (BID) | OROMUCOSAL | Status: DC
  Administered 2019-03-31 – 2019-04-01 (×3): 15 mL via OROMUCOSAL

## 2019-03-30 MED ORDER — VITAMIN B-1 100 MG PO TABS
100.0000 mg | ORAL_TABLET | Freq: Every day | ORAL | Status: DC
  Administered 2019-03-31 – 2019-04-01 (×2): 100 mg via ORAL
  Filled 2019-03-30 (×2): qty 1

## 2019-03-30 MED ORDER — ASPIRIN 81 MG PO CHEW
324.0000 mg | CHEWABLE_TABLET | Freq: Once | ORAL | Status: AC
  Administered 2019-03-30: 324 mg via ORAL
  Filled 2019-03-30: qty 4

## 2019-03-30 MED ORDER — METHYLPREDNISOLONE SODIUM SUCC 125 MG IJ SOLR
60.0000 mg | Freq: Four times a day (QID) | INTRAMUSCULAR | Status: DC
  Administered 2019-03-31 – 2019-04-01 (×7): 60 mg via INTRAVENOUS
  Filled 2019-03-30 (×7): qty 2

## 2019-03-30 MED ORDER — LEVOFLOXACIN IN D5W 750 MG/150ML IV SOLN
750.0000 mg | Freq: Once | INTRAVENOUS | Status: AC
  Administered 2019-03-30: 750 mg via INTRAVENOUS
  Filled 2019-03-30: qty 150

## 2019-03-30 MED ORDER — SODIUM CHLORIDE 0.9% FLUSH
3.0000 mL | Freq: Once | INTRAVENOUS | Status: AC
  Administered 2019-03-30: 3 mL via INTRAVENOUS

## 2019-03-30 MED ORDER — METHYLPREDNISOLONE SODIUM SUCC 125 MG IJ SOLR
125.0000 mg | Freq: Once | INTRAMUSCULAR | Status: AC
  Administered 2019-03-30: 125 mg via INTRAVENOUS
  Filled 2019-03-30: qty 2

## 2019-03-30 MED ORDER — ESCITALOPRAM OXALATE 10 MG PO TABS
5.0000 mg | ORAL_TABLET | Freq: Every day | ORAL | Status: DC
  Administered 2019-03-31 – 2019-04-01 (×2): 5 mg via ORAL
  Filled 2019-03-30 (×2): qty 1

## 2019-03-30 MED ORDER — AEROCHAMBER Z-STAT PLUS/MEDIUM MISC
1.0000 | Freq: Once | Status: AC
  Administered 2019-03-30: 1
  Filled 2019-03-30: qty 1

## 2019-03-30 MED ORDER — SODIUM CHLORIDE 0.9 % IV BOLUS
500.0000 mL | Freq: Once | INTRAVENOUS | Status: AC
  Administered 2019-03-30: 500 mL via INTRAVENOUS

## 2019-03-30 MED ORDER — HEPARIN BOLUS VIA INFUSION
3200.0000 [IU] | Freq: Once | INTRAVENOUS | Status: AC
  Administered 2019-03-30: 3200 [IU] via INTRAVENOUS

## 2019-03-30 MED ORDER — IPRATROPIUM-ALBUTEROL 0.5-2.5 (3) MG/3ML IN SOLN
3.0000 mL | Freq: Four times a day (QID) | RESPIRATORY_TRACT | Status: DC
  Administered 2019-03-30: 3 mL via RESPIRATORY_TRACT
  Filled 2019-03-30: qty 3

## 2019-03-30 MED ORDER — DILTIAZEM HCL 100 MG IV SOLR
5.0000 mg/h | INTRAVENOUS | Status: DC
  Administered 2019-03-30: 15 mg/h via INTRAVENOUS
  Administered 2019-03-30: 5 mg/h via INTRAVENOUS
  Administered 2019-03-31: 15 mg/h via INTRAVENOUS
  Filled 2019-03-30 (×3): qty 100

## 2019-03-30 MED ORDER — FOLIC ACID 1 MG PO TABS
1.0000 mg | ORAL_TABLET | Freq: Every day | ORAL | Status: DC
  Administered 2019-03-31 – 2019-04-01 (×2): 1 mg via ORAL
  Filled 2019-03-30 (×2): qty 1

## 2019-03-30 MED ORDER — ONDANSETRON HCL 4 MG/2ML IJ SOLN: 4.0000 mg | Freq: Four times a day (QID) | INTRAMUSCULAR | Status: DC | PRN

## 2019-03-30 MED ORDER — ONDANSETRON HCL 4 MG PO TABS: 4.0000 mg | ORAL_TABLET | Freq: Four times a day (QID) | ORAL | Status: DC | PRN

## 2019-03-30 MED ORDER — ALBUTEROL SULFATE HFA 108 (90 BASE) MCG/ACT IN AERS
2.0000 | INHALATION_SPRAY | Freq: Once | RESPIRATORY_TRACT | Status: AC
  Administered 2019-03-30: 2 via RESPIRATORY_TRACT
  Filled 2019-03-30: qty 6.7

## 2019-03-30 MED ORDER — METOPROLOL TARTRATE 5 MG/5ML IV SOLN
2.5000 mg | Freq: Once | INTRAVENOUS | Status: AC
  Administered 2019-03-30: 2.5 mg via INTRAVENOUS
  Filled 2019-03-30: qty 5

## 2019-03-30 MED ORDER — LEVOFLOXACIN IN D5W 750 MG/150ML IV SOLN: 750.0000 mg | INTRAVENOUS | Status: DC

## 2019-03-30 MED ORDER — SODIUM CHLORIDE 0.9 % IV SOLN
INTRAVENOUS | Status: DC
  Administered 2019-03-30 – 2019-04-06 (×3): via INTRAVENOUS

## 2019-03-30 MED ORDER — CLOPIDOGREL BISULFATE 75 MG PO TABS
75.0000 mg | ORAL_TABLET | Freq: Every day | ORAL | Status: DC
  Administered 2019-03-31 – 2019-04-01 (×2): 75 mg via ORAL
  Filled 2019-03-30 (×2): qty 1

## 2019-03-30 MED ORDER — ACETAMINOPHEN 325 MG PO TABS: 650.0000 mg | ORAL_TABLET | Freq: Four times a day (QID) | ORAL | Status: DC | PRN

## 2019-03-30 MED ORDER — DILTIAZEM LOAD VIA INFUSION
10.0000 mg | Freq: Once | INTRAVENOUS | Status: AC
  Administered 2019-03-30: 10 mg via INTRAVENOUS
  Filled 2019-03-30: qty 10

## 2019-03-30 MED ORDER — ATORVASTATIN CALCIUM 80 MG PO TABS
80.0000 mg | ORAL_TABLET | Freq: Every day | ORAL | Status: DC
  Administered 2019-03-30 – 2019-03-31 (×2): 80 mg via ORAL
  Filled 2019-03-30: qty 1
  Filled 2019-03-30 (×2): qty 2

## 2019-03-30 NOTE — ED Notes (Signed)
ED TO INPATIENT HANDOFF REPORT  ED Nurse Name and Phone #: Nira Conn [phone]  S Name/Age/Gender Joshua Whitaker 78 y.o. male Room/Bed: APA03/APA03  Code Status   Code Status: Prior  Home/SNF/Other Home Patient oriented to: self, place, time and situation Is this baseline? Yes   Triage Complete: Triage complete  Chief Complaint Shortness of Breath  Triage Note Pt arrived via [facility] from home for SOB. Pt with history of COPD and is a current smoker. Pt wears between 2-4L of O2 at home. Pt denies chest pain. Pt states SOB started this morning. Pt with labored breathing.    Allergies Allergies  Allergen Reactions  . Tiotropium Bromide Monohydrate Other (See Comments)    Severe urinary retention  . Varenicline Other (See Comments)    Psychiatric and dreams  . Iodinated Diagnostic Agents   . Statins     Level of Care/Admitting Diagnosis ED Disposition    ED Disposition Condition [facility] Area: [facility] [[id number]]  Level of Care: Stepdown [14]  Diagnosis: Atrial flutter ([place]) [427.32.ICD-9-CM]  Admitting Physician: Loree Fee  Attending Physician: Oswald Hillock [[id number]]  Estimated length of stay: 3 - 4 days  Certification:: I certify this patient will need inpatient services for at least 2 midnights  PT Class (Do Not Modify): Inpatient [101]  PT Acc Code (Do Not Modify): Private [1]       B Medical/Surgery History Past Medical History:  Diagnosis Date  . Allergy   . Anxiety   . Asthma   . Cancer ([place]) 2007   prosate  . COPD (chronic obstructive pulmonary disease) ([place])   . Depression   . Prostate cancer [facility])    Past Surgical History:  Procedure Laterality Date  . EYE SURGERY    . PROSTATE SURGERY    . SPINE SURGERY       A IV Location/Drains/Wounds Patient Lines/Drains/Airways Status   Active Line/Drains/Airways    Name:   Placement date:   Placement time:   Site:   Days:   Peripheral IV 03/30/19  Right Forearm   03/30/19    1629    Forearm   less than 1   Peripheral IV 03/30/19 Left Antecubital   03/30/19    1630    Antecubital   less than 1          Intake/Output Last 24 hours  Intake/Output Summary (Last 24 hours) at 03/30/2019 2047 Last data filed at 03/30/2019 1703 Gross per 24 hour  Intake 500 ml  Output -  Net 500 ml    Labs/Imaging Results for orders placed or performed during the hospital encounter of 03/30/19 (from the past 48 hour(s))  Basic metabolic panel     Status: Abnormal   Collection Time: 03/30/19  4:46 PM  Result Value Ref Range   Sodium 140 135 - 145 mmol/L   Potassium 4.5 3.5 - 5.1 mmol/L   Chloride 105 98 - 111 mmol/L   CO2 24 22 - 32 mmol/L   Glucose, Bld 91 70 - 99 mg/dL   BUN 31 (H) 8 - 23 mg/dL   Creatinine, Ser 0.83 0.61 - 1.24 mg/dL   Calcium 8.5 (L) 8.9 - 10.3 mg/dL   GFR calc non Af Amer >60 >60 mL/min   GFR calc Af Amer >60 >60 mL/min   Anion gap 11 5 - 15    Comment: Performed at [facility], [address] [place], [place] [address]  CBC     Status: Abnormal   Collection Time: 03/30/19  4:46 PM  Result Value Ref Range   WBC 21.4 (H) 4.0 - 10.5 K/uL   RBC 4.03 (L) 4.22 - 5.81 MIL/uL   Hemoglobin 12.3 (L) 13.0 - 17.0 g/dL   HCT 38.3 (L) 39.0 - 52.0 %   MCV 95.0 80.0 - 100.0 fL   MCH 30.5 26.0 - 34.0 pg   MCHC 32.1 30.0 - 36.0 g/dL   RDW 14.0 11.5 - 15.5 %   Platelets 350 150 - 400 K/uL   nRBC 0.0 0.0 - 0.2 %    Comment: Performed at [facility], [address], [place], [place] [address]  Troponin I - ONCE - STAT     Status: Abnormal   Collection Time: 03/30/19  4:46 PM  Result Value Ref Range   Troponin I 1.12 (HH) <0.03 ng/mL    Comment: CRITICAL RESULT CALLED TO, READ BACK BY AND VERIFIED WITH: Reneisha Stilley,H ON 03/30/19 AT 1745 BY LOY,C Performed at [facility], [address], [place], [place] [address]   Ethanol     Status: None   Collection Time: 03/30/19  4:53 PM  Result Value Ref Range   Alcohol, Ethyl (B) <10 <10 mg/dL     Comment: (NOTE) Lowest detectable limit for serum alcohol is 10 mg/dL. For medical purposes only. Performed at [facility], [address], [place], [place] [address]   Hepatic function panel     Status: Abnormal   Collection Time: 03/30/19  4:57 PM  Result Value Ref Range   Total Protein 6.3 (L) 6.5 - 8.1 g/dL   Albumin 3.0 (L) 3.5 - 5.0 g/dL   AST 74 (H) 15 - 41 U/L   ALT 92 (H) 0 - 44 U/L   Alkaline Phosphatase 117 38 - 126 U/L   Total Bilirubin 0.3 0.3 - 1.2 mg/dL   Bilirubin, Direct 0.1 0.0 - 0.2 mg/dL   Indirect Bilirubin 0.2 (L) 0.3 - 0.9 mg/dL    Comment: Performed at [facility], [address], [place], [place] [address]  Troponin I - Once     Status: Abnormal   Collection Time: 03/30/19  6:33 PM  Result Value Ref Range   Troponin I 1.14 (HH) <0.03 ng/mL    Comment: CRITICAL VALUE NOTED.  VALUE IS CONSISTENT WITH PREVIOUSLY REPORTED AND CALLED VALUE. Performed at [facility], [address] [place], [place] [address]    Dg Chest 2 View  Result Date: 03/30/2019 CLINICAL DATA:  Shortness of breath which is recurrent but intermittent. EXAM: CHEST - 2 VIEW COMPARISON:  03/13/2019.  01/06/2019.  Chest CT 04/30/2018. FINDINGS: Cardiomegaly and aortic atherosclerosis as seen previously. Emphysema, upper lobe predominant. Pulmonary scarring. Focal density in the left upper lobe as seen previously, felt to represent scarring by CT. Increased patchy density in the mid and lower lungs not seen previously that could represent superimposed pneumonia. IMPRESSION: Background severe COPD. Newly seen patchy density in the mid and lower lungs left more than right that could represent overlying acute pneumonia. Electronically Signed   By: Nelson Chimes M.D.   On: 03/30/2019 17:50    Pending Labs Unresulted Labs (From admission, onward)    Start     Ordered   04/01/19 0500  CBC  Every Mon-Wed-Fri (0500),   R     03/30/19 2044   03/30/19 2045  Heparin level (unfractionated)  ONCE - STAT,    R     03/30/19 2044  03/30/19 2045  APTT  ONCE - STAT,   R     03/30/19 2044   Signed and Held  CBC  Tomorrow morning,   R     Signed and Held   Signed and Held  Comprehensive metabolic panel  Tomorrow morning,   R     Signed and Held   Signed and Held  Troponin I - Now Then Q6H  Now then every 6 hours,   R     Signed and Held          Vitals/Pain Today's Vitals   03/30/19 1845 03/30/19 1900 03/30/19 1930 03/30/19 2000  BP: 114/79 109/78 117/81 110/62  Pulse:  94 (!) 35 80  Resp: (!) 31 (!) 28 (!) 27 (!) 26  Temp:      TempSrc:      SpO2:  100% 96% 100%  Weight:      Height:      PainSc:        Isolation Precautions No active isolations  Medications Medications  diltiazem (CARDIZEM) 1 mg/mL load via infusion 10 mg (10 mg Intravenous Bolus from Bag 03/30/19 1743)    And  diltiazem (CARDIZEM) 100 mg in dextrose 5 % 100 mL (1 mg/mL) infusion (20 mg/hr Intravenous Rate/Dose Change 03/30/19 1846)  levofloxacin (LEVAQUIN) IVPB 750 mg (750 mg Intravenous New Bag/Given 03/30/19 1932)  sodium chloride flush (NS) 0.9 % injection 3 mL (3 mLs Intravenous Given 03/30/19 1633)  sodium chloride 0.9 % bolus 500 mL (0 mLs Intravenous Stopped 03/30/19 1703)  metoprolol tartrate (LOPRESSOR) injection 2.5 mg (2.5 mg Intravenous Given 03/30/19 1703)  aspirin chewable tablet 324 mg (324 mg Oral Given 03/30/19 1829)  methylPREDNISolone sodium succinate (SOLU-MEDROL) 125 mg/2 mL injection 125 mg (125 mg Intravenous Given 03/30/19 1929)  albuterol (PROVENTIL HFA;VENTOLIN HFA) 108 (90 Base) MCG/ACT inhaler 2 puff (2 puffs Inhalation Given 03/30/19 1935)  aerochamber Z-Stat Plus/medium 1 each (1 each Other Given 03/30/19 1935)    Mobility walks High fall risk   Focused Assessments Cardiac Assessment Handoff:    Lab Results  Component Value Date   TROPONINI 1.14 ([place]) 03/30/2019   No results found for: DDIMER Does the Patient currently have chest pain? No     R Recommendations: See Admitting  Provider Note  Report given to:   Additional Notes:

## 2019-03-30 NOTE — ED Triage Notes (Signed)
Pt arrived via [facility] from home for SOB. Pt with history of COPD and is a current smoker. Pt wears between 2-4L of O2 at home. Pt denies chest pain. Pt states SOB started this morning. Pt with labored breathing.

## 2019-03-30 NOTE — ED Notes (Signed)
Dr Eulis Foster at bedside informed to increase Cardizem to 20 mg/hr at 1845.

## 2019-03-30 NOTE — ED Notes (Signed)
Date and time results received: 03/30/19 1745 (use smartphrase ".now" to insert current time)  Test: Troponin Critical Value: 1.12  Name of Provider Notified: Dr Eulis Foster Orders Received? Or Actions Taken?: NA

## 2019-03-30 NOTE — Progress Notes (Addendum)
ANTICOAGULATION CONSULT NOTE - Initial Consult  Pharmacy Consult for heparin dosing Indication: atrial fibrillation  Allergies  Allergen Reactions  . Tiotropium Bromide Monohydrate Other (See Comments)    Severe urinary retention  . Varenicline Other (See Comments)    Psychiatric and dreams  . Iodinated Diagnostic Agents   . Statins     Patient Measurements: Height: 5\' 10"  (177.8 cm) Weight: 141 lb 15.6 oz (64.4 kg) IBW/kg (Calculated) : 73 Heparin Dosing Weight: HEPARIN DW (KG): 64.4  Vital Signs: Temp: 98 F (36.7 C) (04/06 1837) Temp Source: Oral (04/06 1837) BP: 110/62 (04/06 2000) Pulse Rate: 80 (04/06 2000)  Labs: Recent Labs    03/30/19 1646 03/30/19 1833  HGB 12.3*  --   HCT 38.3*  --   PLT 350  --   CREATININE 0.83  --   TROPONINI 1.12* 1.14*    Estimated Creatinine Clearance: 70 mL/min (by C-G formula based on SCr of 0.83 mg/dL).   Medical History: Past Medical History:  Diagnosis Date  . Allergy   . Anxiety   . Asthma   . Cancer ([place]) 2007   prosate  . COPD (chronic obstructive pulmonary disease) ([place])   . Depression   . Prostate cancer [facility])      Assessment: Pharmacy consulted to dose heparin infusion for this 78 yo male with atrial fibrillation and elevated troponin I. He hasn't been on any anti-coagulation prior to admission.   Goal of Therapy:  Heparin level 0.3-0.7 units/ml Monitor platelets by anticoagulation protocol: Yes   Plan:  Give 3200 unit bolus x 1 Start heparin infusion at 900 units/hr Check anti-Xa level in 6-8 hours and daily while on heparin Continue to monitor H&H and platelets  Despina Pole 03/30/2019,8:45 PM

## 2019-03-30 NOTE — ED Provider Notes (Signed)
[facility] Provider Note   CSN: [id number] Arrival date & time: 03/30/19  1618    History   Chief Complaint Chief Complaint  Patient presents with  . Shortness of Breath    HPI GEMAYEL MASCIO is a 78 y.o. male.     HPI   Patient was evaluated by me with routine PPE for situation.  Facemask, and eye shield.  Patient is not currently on isolation.  He presents for evaluation of shortness of breath, which started in the last couple of days.  He is vague about exactly when it started stating "I always have trouble breathing." At home, and still smokes cigarettes.  Was recently hospitalized for shortness of breath, at that time there was concern for MI however he was followed expectantly and did well.  He did not have an advanced cardiac study done.  He states he is taking his usual medications.  He denies fever, chills, productive cough, nausea, vomiting, abdominal pain, back pain, chest pain, weakness or dizziness.  There are no other known modifying factors.  Past Medical History:  Diagnosis Date  . Allergy   . Anxiety   . Asthma   . Cancer ([place]) 2007   prosate  . COPD (chronic obstructive pulmonary disease) ([place])   . Depression   . Prostate cancer [facility])     Patient Active Problem List   Diagnosis Date Noted  . Acute on chronic respiratory failure with hypoxia ([place]) 03/16/2019  . Alcohol abuse 03/16/2019  . Tobacco abuse 03/16/2019  . Medically noncompliant 03/16/2019  . Pressure injury of skin 03/14/2019  . Coronary artery calcification 03/14/2019  . Aortic atherosclerosis ([place]) 03/14/2019  . COPD exacerbation ([place])   . Hyponatremia   . ST elevation   . Elevated troponin   . Nodule of right lung 10/12/2015  . H/O prostate cancer 04/21/2014  . Essential hypertension 03/20/2011  . Hyperlipidemia 03/20/2011  . Panlobular emphysema ([place]) 03/20/2011    Past Surgical History:  Procedure Laterality Date  . EYE SURGERY    . PROSTATE SURGERY     . SPINE SURGERY          Home Medications    Prior to Admission medications   Medication Sig Start Date End Date Taking? Authorizing Provider  atorvastatin (LIPITOR) 80 MG tablet Take 1 tablet (80 mg total) by mouth daily at 6 PM. 03/25/19  Yes Stacks, Cletus Gash, MD  clopidogrel (PLAVIX) 75 MG tablet Take 1 tablet (75 mg total) by mouth daily. 03/17/19  Yes Dessa Phi, DO  Fluticasone-Umeclidin-Vilant (TRELEGY ELLIPTA) 100-62.5-25 MCG/INH AEPB Inhale 1 puff into the lungs daily. 10/15/18  Yes Stacks, Cletus Gash, MD  Ipratropium-Albuterol (COMBIVENT RESPIMAT) 20-100 MCG/ACT AERS respimat Inhale 2 puffs into the lungs every 6 (six) hours. 10/15/18  Yes [name], MD  megestrol (MEGACE) 400 MG/10ML suspension Take 10 mLs (400 mg total) by mouth 2 (two) times daily. For appetite stimulation 03/23/19  Yes Stacks, Cletus Gash, MD  predniSONE (DELTASONE) 10 MG tablet Take 5 daily for 3 days followed by 4,3,2 and 1 for 3 days each. 03/23/19  Yes [name], MD  PROAIR HFA [id number] Base) MCG/ACT inhaler Inhale 1-2 puffs into the lungs every 6 (six) hours as needed for wheezing or shortness of breath. Patient taking differently: Inhale 1-2 puffs into the lungs every 6 (six) hours as needed for wheezing or shortness of breath.  01/30/19  Yes Hassell Done, Mary-Margaret, FNP  cholestyramine Lucrezia Starch) 4 g packet  12/02/17   [provider]  escitalopram (LEXAPRO) 5 MG tablet Take 5 mg by mouth every morning. 03/10/19   [provider]  fluticasone (FLONASE) 50 MCG/ACT nasal spray Place 1 spray into both nostrils daily. 10/15/18   [name], MD  folic acid (FOLVITE) 1 MG tablet Take 1 tablet (1 mg total) by mouth daily. 03/17/19   Dessa Phi, DO  thiamine 100 MG tablet Take 1 tablet (100 mg total) by mouth daily. 03/17/19   Dessa Phi, DO    Family History Family History  Problem Relation Age of Onset  . Heart disease Mother   . Arthritis Mother   . Heart disease Father   .  Cancer Sister        kidney  . Cancer Brother        lung  . Cancer Brother        lung  . Stroke Brother   . Cancer Brother        prostate  . Cancer Sister        colon  . Cancer Sister        bone  . Diabetes Sister   . Chromosomal disorder Sister   . Alcohol abuse Daughter   . Rheum arthritis Daughter   . Rheum arthritis Daughter     Social History Social History   Tobacco Use  . Smoking status: Current Every Day Smoker    Packs/day: 1.00    Types: Cigarettes  . Smokeless tobacco: Never Used  Substance Use Topics  . Alcohol use: Yes    Comment: occ  . Drug use: No     Allergies   Tiotropium bromide monohydrate; Varenicline; Iodinated diagnostic agents; and Statins   Review of Systems Review of Systems  All other systems reviewed and are negative.    Physical Exam Updated Vital Signs BP 117/81   Pulse (!) 35   Temp 98 F (36.7 C) (Oral)   Resp (!) 27   Ht 5\' 10"  (1.778 m)   Wt 64.4 kg   SpO2 96%   BMI 20.37 kg/m   Physical Exam Vitals signs and nursing note reviewed.  Constitutional:      Appearance: He is well-developed.  HENT:     Head: Normocephalic and atraumatic.     Right Ear: External ear normal.     Left Ear: External ear normal.  Eyes:     Conjunctiva/sclera: Conjunctivae normal.     Pupils: Pupils are equal, round, and reactive to light.  Neck:     Musculoskeletal: Normal range of motion and neck supple.     Trachea: Phonation normal.  Cardiovascular:     Rate and Rhythm: Regular rhythm. Tachycardia present.     Heart sounds: Normal heart sounds.  Pulmonary:     Effort: Pulmonary effort is normal.     Breath sounds: Normal breath sounds.  Abdominal:     Palpations: Abdomen is soft.     Tenderness: There is no abdominal tenderness.  Musculoskeletal: Normal range of motion.        General: No swelling or tenderness.     Right lower leg: No edema.     Left lower leg: No edema.  Skin:    General: Skin is warm and dry.   Neurological:     Mental Status: He is alert and oriented to person, place, and time.     Cranial Nerves: No cranial nerve deficit.     Sensory: No sensory deficit.     Motor: No abnormal muscle  tone.     Coordination: Coordination normal.  Psychiatric:        Mood and Affect: Mood normal.        Behavior: Behavior normal.        Thought Content: Thought content normal.        Judgment: Judgment normal.      ED Treatments / Results  Labs (all labs ordered are listed, but only abnormal results are displayed) Labs Reviewed  BASIC METABOLIC PANEL - Abnormal; Notable for the following components:      Result Value   BUN 31 (*)    Calcium 8.5 (*)    All other components within normal limits  CBC - Abnormal; Notable for the following components:   WBC 21.4 (*)    RBC 4.03 (*)    Hemoglobin 12.3 (*)    HCT 38.3 (*)    All other components within normal limits  TROPONIN I - Abnormal; Notable for the following components:   Troponin I 1.12 (*)    All other components within normal limits  HEPATIC FUNCTION PANEL - Abnormal; Notable for the following components:   Total Protein 6.3 (*)    Albumin 3.0 (*)    AST 74 (*)    ALT 92 (*)    Indirect Bilirubin 0.2 (*)    All other components within normal limits  TROPONIN I - Abnormal; Notable for the following components:   Troponin I 1.14 (*)    All other components within normal limits  ETHANOL    EKG EKG Interpretation  Date/Time:  Monday March 30 2019 16:26:18 EDT Ventricular Rate:  160 PR Interval:    QRS Duration: 103 QT Interval:  325 QTC Calculation: 531 R Axis:   75 Text Interpretation:  Supraventricular tachycardia Multiform ventricular premature complexes Repolarization abnormality, prob rate related Since last tracing rate faster and inferior ST elevation has resolved Confirmed by Daleen Bo [id number]) on 03/30/2019 4:47:42 PM   EKG Interpretation  Date/Time:  Monday March 30 2019 19:16:57 EDT Ventricular Rate:   126 PR Interval:    QRS Duration: 109 QT Interval:  320 QTC Calculation: 443 R Axis:   -20 Text Interpretation:  Atrial flutter with predominant 3:1 AV block Borderline left axis deviation RSR' in V1 or V2, probably normal variant Borderline repolarization abnormality Minimal ST elevation, lateral leads Since last tracing rate slower and flutter waves are seen Confirmed by Daleen Bo [phone]) on 03/30/2019 7:28:11 PM        Radiology Dg Chest 2 View  Result Date: 03/30/2019 CLINICAL DATA:  Shortness of breath which is recurrent but intermittent. EXAM: CHEST - 2 VIEW COMPARISON:  03/13/2019.  01/06/2019.  Chest CT 04/30/2018. FINDINGS: Cardiomegaly and aortic atherosclerosis as seen previously. Emphysema, upper lobe predominant. Pulmonary scarring. Focal density in the left upper lobe as seen previously, felt to represent scarring by CT. Increased patchy density in the mid and lower lungs not seen previously that could represent superimposed pneumonia. IMPRESSION: Background severe COPD. Newly seen patchy density in the mid and lower lungs left more than right that could represent overlying acute pneumonia. Electronically Signed   By: Nelson Chimes M.D.   On: 03/30/2019 17:50    Procedures .Critical Care Performed by: Daleen Bo, MD Authorized by: Daleen Bo, MD   Critical care provider statement:    Critical care time (minutes):  55   Critical care start time:  03/30/2019 4:35 PM   Critical care end time:  03/30/2019 7:35 PM   Critical  care time was exclusive of:  Separately billable procedures and treating other patients   Critical care was time spent personally by me on the following activities:  Blood draw for specimens, development of treatment plan with patient or surrogate, discussions with consultants, evaluation of patient's response to treatment, examination of patient, obtaining history from patient or surrogate, ordering and performing treatments and interventions, ordering  and review of laboratory studies, pulse oximetry, re-evaluation of patient's condition, review of old charts and ordering and review of radiographic studies   (including critical care time)  Medications Ordered in ED Medications  diltiazem (CARDIZEM) 1 mg/mL load via infusion 10 mg (10 mg Intravenous Bolus from Bag 03/30/19 1743)    And  diltiazem (CARDIZEM) 100 mg in dextrose 5 % 100 mL (1 mg/mL) infusion (20 mg/hr Intravenous Rate/Dose Change 03/30/19 1846)  levofloxacin (LEVAQUIN) IVPB 750 mg (750 mg Intravenous New Bag/Given 03/30/19 1932)  sodium chloride flush (NS) 0.9 % injection 3 mL (3 mLs Intravenous Given 03/30/19 1633)  sodium chloride 0.9 % bolus 500 mL (0 mLs Intravenous Stopped 03/30/19 1703)  metoprolol tartrate (LOPRESSOR) injection 2.5 mg (2.5 mg Intravenous Given 03/30/19 1703)  aspirin chewable tablet 324 mg (324 mg Oral Given 03/30/19 1829)  methylPREDNISolone sodium succinate (SOLU-MEDROL) 125 mg/2 mL injection 125 mg (125 mg Intravenous Given 03/30/19 1929)  albuterol (PROVENTIL HFA;VENTOLIN HFA) 108 (90 Base) MCG/ACT inhaler 2 puff (2 puffs Inhalation Given 03/30/19 1935)  aerochamber Z-Stat Plus/medium 1 each (1 each Other Given 03/30/19 1935)     Initial Impression / Assessment and Plan / ED Course  I have reviewed the triage vital signs and the nursing notes.  Pertinent labs & imaging results that were available during my care of the patient were reviewed by me and considered in my medical decision making (see chart for details).  Clinical Course as of Mar 29 1940  Mon Mar 30, 2019  1649 Initial evaluation indicates rapid heartbeat, 160/min.  Heartbeat is regular, on the twelve-lead EKG.  The EKG he may have 1 dropped QRS however the rhythm continues regularly afterwards.  This may represent atrial flutter.   [EW]  1733 No change and heart rate, after Lopressor given.  Next step will be Cardizem.   [EW]  1746 Normal except protein low, albumin low, AST high, ALT high, indirect  bilirubin low  Hepatic function panel(!) [EW]  1746 Normal  Ethanol [EW]  1746 Normal except white count high, hemoglobin low  CBC(!) [EW]  1746 Abnormal, elevated  Troponin I - ONCE - STAT(!!) [EW]  1746 Normal except BUN high, calcium low  Basic metabolic panel(!) [EW]  0626 No CHF, patchy infiltrate left lung, image reviewed by me  DG Chest 2 View [EW]  1920 Currently, blood pressure, 109/78.  Patient still complains about shortness of breath.  He remains tachypneic.  Lungs with expiratory wheezing, posteriorly.  Initial exam was anterior and did not reveal wheezing.   [EW]  1924 Elevated, unchanged from earlier.  Troponin I - Once(!!) [EW]  1933 I discussed the case with patient's wife [name], at her home, by telephone.  She reports that he has been having trouble breathing "for a long time."  He states that it was bad while he was recently hospitalized.  She is also concerned about some bedsores, that the patient might have, but does not have pain from them.  I explained to her that he will require admission for treatment of atrial flutter, and pneumonia.  She does not recall that  he has had atrial flutter, or fibrillation before.   [EW]    Clinical Course User Index [EW] Daleen Bo, MD        Patient Vitals for the past 24 hrs:  BP Temp Temp src Pulse Resp SpO2 Height Weight  03/30/19 1930 117/81 - - (!) 35 (!) 27 96 % - -  03/30/19 1900 109/78 - - 94 (!) 28 100 % - -  03/30/19 1845 114/79 - - - (!) 31 - - -  03/30/19 1837 - 98 F (36.7 C) Oral - - - - -  03/30/19 1830 119/82 - - (!) 158 (!) 29 98 % - -  03/30/19 1801 109/75 - - (!) 161 (!) 27 97 % - -  03/30/19 1748 118/79 - - (!) 146 (!) 32 98 % - -  03/30/19 1730 119/88 - - (!) 159 (!) 27 100 % - -  03/30/19 1700 129/88 - - - (!) 34 - - -  03/30/19 1630 132/84 - - - (!) 31 - - -  03/30/19 1626 - - - - - - 5\' 10"  (1.778 m) 64.4 kg    7:41 PM Reevaluation with update and discussion. After initial assessment and  treatment, an updated evaluation reveals patient relatively comfortable now.  Findings discussed with him as well as his wife by telephone, all questions answered. Daleen Bo   Medical Decision Making: Patient presenting for evaluation of shortness of breath.  He is a poor historian.  His wife apparently called EMS.  She had been concerned about his sodium level, following recent hospitalization.  Evaluation consistent with new onset atrial flutter with rapid rate, requiring Cardizem after failure of Lopressor to control rate.  Heart rate improved with stable blood pressure.  Mild troponin elevation, likely secondary to rapid atrial flutter.  Doubt ACS or PE.  Incidental pneumonia, felt to be community-acquired.  Doubt sepsis, metabolic instability or impending vascular collapse.  Doubt viral, covert infection.  AIZEN DUVAL was evaluated in Emergency Department on 03/30/2019 for the symptoms described in the history of present illness. He was evaluated in the context of the global COVID-19 pandemic, which necessitated consideration that the patient might be at risk for infection with the SARS-CoV-2 virus that causes COVID-19. Institutional protocols and algorithms that pertain to the evaluation of patients at risk for COVID-19 are in a state of rapid change based on information released by regulatory bodies including the CDC and federal and state organizations. These policies and algorithms were followed during the patient's care in the ED.   CRITICAL CARE-yes Performed by: Daleen Bo  This patients CHA2DS2-VASc Score and unadjusted Ischemic Stroke Rate (% per year) is equal to 3.2 % stroke rate/year from a score of 3 (age, hypertension)  Above score calculated as 1 point each if present [CHF, HTN, DM, Vascular=MI/PAD/Aortic Plaque, Age if 78-74, or Male] Above score calculated as 2 points each if present [Age > 75, or Stroke/TIA/TE]     Final Clinical Impressions(s) / ED Diagnoses    Final diagnoses:  Atrial flutter, unspecified type ([place])  Chronic obstructive pulmonary disease, unspecified COPD type ([place])  Elevated troponin  Community acquired pneumonia of left lung, unspecified part of lung    Nursing Notes Reviewed/ Care Coordinated Applicable Imaging Reviewed Interpretation of Laboratory Data incorporated into ED treatment   7:30 PM-Consult complete with hospitalist. Patient case explained and discussed.  He agrees to admit patient for further evaluation and treatment. Call ended at 7:41 PM  Plan: Admit  ED Discharge Orders    None       Daleen Bo, MD 03/30/19 1942

## 2019-03-30 NOTE — ED Notes (Signed)
EKG given to Dr. Thurnell Garbe by Josph Macho NT

## 2019-03-30 NOTE — ED Notes (Signed)
Have notified Dr. Eulis Foster of patients HR

## 2019-03-30 NOTE — Progress Notes (Signed)
Pharmacy Antibiotic Note  Joshua Whitaker is a 78 y.o. male admitted on 03/30/2019 with CAP/bronchitis.  Pharmacy has been consulted for Levaquin dosing.  Plan: Start Levaquin 750mg  IV x5 days. Pharmacy to monitor renal function, cultures and patient progress.  Height: 5\' 10"  (177.8 cm) Weight: 141 lb 15.6 oz (64.4 kg) IBW/kg (Calculated) : 73  Temp (24hrs), Avg:98 F (36.7 C), Min:98 F (36.7 C), Max:98 F (36.7 C)  Recent Labs  Lab 03/30/19 1646  WBC 21.4*  CREATININE 0.83    Estimated Creatinine Clearance: 70 mL/min (by C-G formula based on SCr of 0.83 mg/dL).    Allergies  Allergen Reactions  . Tiotropium Bromide Monohydrate Other (See Comments)    Severe urinary retention  . Varenicline Other (See Comments)    Psychiatric and dreams  . Iodinated Diagnostic Agents   . Statins     Antimicrobials this admission: 4/6 levaquin >>      Microbiology results:  4/6 Resp Panel PCR: negative 4/6 MRSA PCR: negative  Thank you for allowing pharmacy to be a part of this patient's care.  Despina Pole 03/30/2019 10:24 PM

## 2019-03-30 NOTE — Progress Notes (Signed)
CRITICAL VALUE ALERT  Critical Value:  Troponin 1.45  Date & Time Notied:  03/30/19, 2340  Provider Notified: [place]  Orders Received/Actions taken:

## 2019-03-30 NOTE — ED Notes (Signed)
ED Provider at bedside. 

## 2019-03-30 NOTE — H&P (Signed)
TRH H&P    Patient Demographics:    Joshua Whitaker, is a 78 y.o. male  MRN: [id number]  DOB - 11/14/1941  Admit Date - 03/30/2019  Referring MD/NP/PA: Dr. Eulis Foster  Outpatient Primary MD for the patient is [name], MD  Patient coming from: Home  Chief complaint-shortness of breath   HPI:    Joshua Whitaker  is a 78 y.o. male, with history of COPD, alcohol abuse, tobacco abuse, hypertension, hyponatremia was brought to the ED for shortness of breath for past 2 days.  Patient is a poor historian.  Is a smoker smokes 2 to 3 packs/day.  Patient was recently admitted to the hospital for concern for MI at that time he did not have cardiac catheterization as per cardiology recommendation, echocardiogram at that time showed no evidence of wall motion abnormality.  Patient was started on Plavix and atorvastatin.  Today in the ED patient was found to be in new onset atrial flutter, chest x-ray showed possibility of pneumonia.  Started on IV Levaquin. He denies chest pain. Denies fever or chills. Denies coughing up any phlegm. Denies previous history of stroke or seizures.    Review of systems:    In addition to the HPI above,   All other systems reviewed and are negative.    Past History of the following :    Past Medical History:  Diagnosis Date  . Allergy   . Anxiety   . Asthma   . Cancer ([place]) 2007   prosate  . COPD (chronic obstructive pulmonary disease) ([place])   . Depression   . Prostate cancer [facility])       Past Surgical History:  Procedure Laterality Date  . EYE SURGERY    . PROSTATE SURGERY    . SPINE SURGERY        Social History:      Social History   Tobacco Use  . Smoking status: Current Every Day Smoker    Packs/day: 1.00    Types: Cigarettes  . Smokeless tobacco: Never Used  Substance Use Topics  . Alcohol use: Yes    Comment: occ       Family History :      Family History  Problem Relation Age of Onset  . Heart disease Mother   . Arthritis Mother   . Heart disease Father   . Cancer Sister        kidney  . Cancer Brother        lung  . Cancer Brother        lung  . Stroke Brother   . Cancer Brother        prostate  . Cancer Sister        colon  . Cancer Sister        bone  . Diabetes Sister   . Chromosomal disorder Sister   . Alcohol abuse Daughter   . Rheum arthritis Daughter   . Rheum arthritis Daughter       Home Medications:   Prior to Admission medications  Medication Sig Start Date End Date Taking? Authorizing Provider  atorvastatin (LIPITOR) 80 MG tablet Take 1 tablet (80 mg total) by mouth daily at 6 PM. 03/25/19  Yes Stacks, Cletus Gash, MD  clopidogrel (PLAVIX) 75 MG tablet Take 1 tablet (75 mg total) by mouth daily. 03/17/19  Yes Dessa Phi, DO  Fluticasone-Umeclidin-Vilant (TRELEGY ELLIPTA) 100-62.5-25 MCG/INH AEPB Inhale 1 puff into the lungs daily. 10/15/18  Yes Stacks, Cletus Gash, MD  Ipratropium-Albuterol (COMBIVENT RESPIMAT) 20-100 MCG/ACT AERS respimat Inhale 2 puffs into the lungs every 6 (six) hours. 10/15/18  Yes [name], MD  megestrol (MEGACE) 400 MG/10ML suspension Take 10 mLs (400 mg total) by mouth 2 (two) times daily. For appetite stimulation 03/23/19  Yes Stacks, Cletus Gash, MD  predniSONE (DELTASONE) 10 MG tablet Take 5 daily for 3 days followed by 4,3,2 and 1 for 3 days each. 03/23/19  Yes [name], MD  PROAIR HFA 108 [id number] Base) MCG/ACT inhaler Inhale 1-2 puffs into the lungs every 6 (six) hours as needed for wheezing or shortness of breath. Patient taking differently: Inhale 1-2 puffs into the lungs every 6 (six) hours as needed for wheezing or shortness of breath.  01/30/19  Yes Hassell Done, Mary-Margaret, FNP  cholestyramine Lucrezia Starch) 4 g packet  12/02/17   [provider]  escitalopram (LEXAPRO) 5 MG tablet Take 5 mg by mouth every morning. 03/10/19   [provider]  fluticasone  (FLONASE) 50 MCG/ACT nasal spray Place 1 spray into both nostrils daily. 10/15/18   [name], MD  folic acid (FOLVITE) 1 MG tablet Take 1 tablet (1 mg total) by mouth daily. 03/17/19   Dessa Phi, DO  thiamine 100 MG tablet Take 1 tablet (100 mg total) by mouth daily. 03/17/19   Dessa Phi, DO     Allergies:     Allergies  Allergen Reactions  . Tiotropium Bromide Monohydrate Other (See Comments)    Severe urinary retention  . Varenicline Other (See Comments)    Psychiatric and dreams  . Iodinated Diagnostic Agents   . Statins      Physical Exam:   Vitals  Blood pressure 110/62, pulse 80, temperature 98 F (36.7 C), temperature source Oral, resp. rate (!) 26, height 5\' 10"  (1.778 m), weight 64.4 kg, SpO2 100 %.  1.  General:  Appears in no acute distress  2. Psychiatric: Alert, oriented x3, intact insight and judgment  3. Neurologic: Cranial nerves II through grossly intact, motor strength 5/5 in all extremities  4. HEENMT:  Atraumatic normocephalic, oral mucosa is pink and moist  5. Respiratory : Diffuse bilateral wheezing auscultated  6. Cardiovascular : S1-S2, regular, no murmur auscultated  7. Gastrointestinal:  Abdomen is soft, nontender, no organomegaly     Data Review:    CBC Recent Labs  Lab 03/30/19 1646  WBC 21.4*  HGB 12.3*  HCT 38.3*  PLT 350  MCV 95.0  MCH 30.5  MCHC 32.1  RDW 14.0   ------------------------------------------------------------------------------------------------------------------  Results for orders placed or performed during the hospital encounter of 03/30/19 (from the past 48 hour(s))  Basic metabolic panel     Status: Abnormal   Collection Time: 03/30/19  4:46 PM  Result Value Ref Range   Sodium 140 135 - 145 mmol/L   Potassium 4.5 3.5 - 5.1 mmol/L   Chloride 105 98 - 111 mmol/L   CO2 24 22 - 32 mmol/L   Glucose, Bld 91 70 - 99 mg/dL   BUN 31 (H) 8 - 23 mg/dL   Creatinine, Ser  0.83 0.61 - 1.24 mg/dL    Calcium 8.5 (L) 8.9 - 10.3 mg/dL   GFR calc non Af Amer >60 >60 mL/min   GFR calc Af Amer >60 >60 mL/min   Anion gap 11 5 - 15    Comment: Performed at [facility], [address], [place], [place] [address]  CBC     Status: Abnormal   Collection Time: 03/30/19  4:46 PM  Result Value Ref Range   WBC 21.4 (H) 4.0 - 10.5 K/uL   RBC 4.03 (L) 4.22 - 5.81 MIL/uL   Hemoglobin 12.3 (L) 13.0 - 17.0 g/dL   HCT 38.3 (L) 39.0 - 52.0 %   MCV 95.0 80.0 - 100.0 fL   MCH 30.5 26.0 - 34.0 pg   MCHC 32.1 30.0 - 36.0 g/dL   RDW 14.0 11.5 - 15.5 %   Platelets 350 150 - 400 K/uL   nRBC 0.0 0.0 - 0.2 %    Comment: Performed at [facility], [address], [place], [place] [address]  Troponin I - ONCE - STAT     Status: Abnormal   Collection Time: 03/30/19  4:46 PM  Result Value Ref Range   Troponin I 1.12 (HH) <0.03 ng/mL    Comment: CRITICAL RESULT CALLED TO, READ BACK BY AND VERIFIED WITH: CRAWFORD,H ON 03/30/19 AT 1745 BY LOY,C Performed at [facility] [address], [place], [place] [address]   Ethanol     Status: None   Collection Time: 03/30/19  4:53 PM  Result Value Ref Range   Alcohol, Ethyl (B) <10 <10 mg/dL    Comment: (NOTE) Lowest detectable limit for serum alcohol is 10 mg/dL. For medical purposes only. Performed at [facility], [address] [place], [place] [address]   Hepatic function panel     Status: Abnormal   Collection Time: 03/30/19  4:57 PM  Result Value Ref Range   Total Protein 6.3 (L) 6.5 - 8.1 g/dL   Albumin 3.0 (L) 3.5 - 5.0 g/dL   AST 74 (H) 15 - 41 U/L   ALT 92 (H) 0 - 44 U/L   Alkaline Phosphatase 117 38 - 126 U/L   Total Bilirubin 0.3 0.3 - 1.2 mg/dL   Bilirubin, Direct 0.1 0.0 - 0.2 mg/dL   Indirect Bilirubin 0.2 (L) 0.3 - 0.9 mg/dL    Comment: Performed at [facility], [address], [place], [place] [address]  Troponin I - Once     Status: Abnormal   Collection Time: 03/30/19  6:33 PM  Result Value Ref Range   Troponin I 1.14 (HH) <0.03 ng/mL     Comment: CRITICAL VALUE NOTED.  VALUE IS CONSISTENT WITH PREVIOUSLY REPORTED AND CALLED VALUE. Performed at [facility], [address] [place], [place] [address]     Chemistries  Recent Labs  Lab 03/30/19 1646 03/30/19 1657  NA 140  --   K 4.5  --   CL 105  --   CO2 24  --   GLUCOSE 91  --   BUN 31*  --   CREATININE 0.83  --   CALCIUM 8.5*  --   AST  --  74*  ALT  --  92*  ALKPHOS  --  117  BILITOT  --  0.3   ------------------------------------------------------------------------------------------------------------------  ------------------------------------------------------------------------------------------------------------------ GFR: Estimated Creatinine Clearance: 70 mL/min (by C-G formula based on SCr of 0.83 mg/dL). Liver Function Tests: Recent Labs  Lab 03/30/19 1657  AST 74*  ALT 92*  ALKPHOS 117  BILITOT 0.3  PROT 6.3*  ALBUMIN 3.0*   No results for input(s): LIPASE, AMYLASE in the last 168 hours. No results for input(s): AMMONIA in the last 168 hours. Coagulation Profile: No results for input(s): INR, PROTIME in the last 168 hours. Cardiac Enzymes: Recent Labs  Lab 03/30/19 1646 03/30/19 1833  TROPONINI 1.12* 1.14*    --------------------------------------------------------------------------------------------------------------- Urine analysis: No results found for: COLORURINE, APPEARANCEUR, LABSPEC, PHURINE, GLUCOSEU, HGBUR, BILIRUBINUR, KETONESUR, PROTEINUR, UROBILINOGEN, NITRITE, LEUKOCYTESUR    Imaging Results:    Dg Chest 2 View  Result Date: 03/30/2019 CLINICAL DATA:  Shortness of breath which is recurrent but intermittent. EXAM: CHEST - 2 VIEW COMPARISON:  03/13/2019.  01/06/2019.  Chest CT 04/30/2018. FINDINGS: Cardiomegaly and aortic atherosclerosis as seen previously. Emphysema, upper lobe predominant. Pulmonary scarring. Focal density in the left upper lobe as seen previously, felt to represent scarring by CT. Increased patchy  density in the mid and lower lungs not seen previously that could represent superimposed pneumonia. IMPRESSION: Background severe COPD. Newly seen patchy density in the mid and lower lungs left more than right that could represent overlying acute pneumonia. Electronically Signed   By: Nelson Chimes M.D.   On: 03/30/2019 17:50    My personal review of EKG: Rhythm -atrial flutter, nonspecific ST changes   Assessment & Plan:    Active Problems:   Atrial flutter ([place])   1. New onset atrial flutter-patient presenting with new onset atrial flutter, CHA2DS2VASc score is 3.  Will start patient on full dose heparin  per pharmacy.  Heart rate is controlled with IV Cardizem.  We will continue with Cardizem.  Patient recently had echocardiogram last month.  Will not repeat echo.  Will consult cardiology in a.m. for further recommendations.  2. Elevated troponin-patient has troponin elevation of 1.14, likely from demand ischemia from atrial flutter for past 2 days.  I doubt ACS.  Patient denies any chest pain.  EKG shows minimal ST elevation in lateral leads/aVL.  I called and discussed with cardiology fellow on-call Dr. Paticia Stack, who says to continue to cycle troponin and heparin.  Patient is asymptomatic.  No urgent need for cardiac catheterization at this time.  Started on heparin as above.  Cardiology has been consulted as above.  3. COPD exacerbation-patient presented with bilateral wheezing on presentation.  Given Solu-Medrol 125 mg IV in the ED.  We will continue with Solu-Medrol 60 mg IV every 6 hours, DuoNeb nebulizer every 6 hours.  4. ?  Pneumonia-chest x-ray shows possibility of pneumonia.  Started on IV Levaquin.  Follow blood culture results.  I do not suspect that patient has COVID-19 at this time, due to absence of fever, cough, no oxygen requirement.  5. Leukocytosis-likely from pneumonia, patient was also prescribed high-dose prednisone taper when he was discharged from hospital on 03/16/2019.   Follow CBC in a.m.  Patient is afebrile.  6. History of alcohol abuse-no signs and symptoms of alcohol withdrawal at this time.  We will continue to monitor.  7. Hypertension-blood pressure is stable.  Started on Cardizem as above.   DVT Prophylaxis-full dose heparin  AM Labs Ordered, also please review Full Orders  Family Communication: Admission, patients condition and plan of care including tests being ordered have been discussed with the patient  who indicate understanding and agree with the plan and Code Status.  Code Status: Full code  Admission status: Inpatient: Based on patients clinical presentation and evaluation of above clinical data, I have made determination that patient meets Inpatient  criteria at this time.  Time spent in minutes : 60 minutes   Oswald Hillock M.D on 03/30/2019 at 8:38 PM

## 2019-03-31 ENCOUNTER — Encounter (HOSPITAL_COMMUNITY): Payer: Self-pay | Admitting: Cardiology

## 2019-03-31 ENCOUNTER — Inpatient Hospital Stay (HOSPITAL_COMMUNITY): Payer: Medicare Other

## 2019-03-31 DIAGNOSIS — I2510 Atherosclerotic heart disease of native coronary artery without angina pectoris: Secondary | ICD-10-CM

## 2019-03-31 DIAGNOSIS — I2584 Coronary atherosclerosis due to calcified coronary lesion: Secondary | ICD-10-CM

## 2019-03-31 DIAGNOSIS — I10 Essential (primary) hypertension: Secondary | ICD-10-CM

## 2019-03-31 DIAGNOSIS — I248 Other forms of acute ischemic heart disease: Secondary | ICD-10-CM

## 2019-03-31 LAB — COMPREHENSIVE METABOLIC PANEL
ALT: 75 U/L — ABNORMAL HIGH (ref 0–44)
AST: 47 U/L — ABNORMAL HIGH (ref 15–41)
Albumin: 2.5 g/dL — ABNORMAL LOW (ref 3.5–5.0)
Alkaline Phosphatase: 97 U/L (ref 38–126)
Anion gap: 9 (ref 5–15)
BUN: 27 mg/dL — ABNORMAL HIGH (ref 8–23)
CO2: 23 mmol/L (ref 22–32)
Calcium: 8 mg/dL — ABNORMAL LOW (ref 8.9–10.3)
Chloride: 105 mmol/L (ref 98–111)
Creatinine, Ser: 0.68 mg/dL (ref 0.61–1.24)
GFR calc Af Amer: >60 mL/min (ref >60)
GFR calc non Af Amer: >60 mL/min (ref >60)
Glucose, Bld: 130 mg/dL — ABNORMAL HIGH (ref 70–99)
Potassium: 4.6 mmol/L (ref 3.5–5.1)
Sodium: 137 mmol/L (ref 135–145)
Total Bilirubin: 0.3 mg/dL (ref 0.3–1.2)
Total Protein: 5.7 g/dL — ABNORMAL LOW (ref 6.5–8.1)

## 2019-03-31 LAB — CBC
HCT: 35.8 % — ABNORMAL LOW (ref 39.0–52.0)
Hemoglobin: 11.3 g/dL — ABNORMAL LOW (ref 13.0–17.0)
MCH: 30 pg (ref 26.0–34.0)
MCHC: 31.6 g/dL (ref 30.0–36.0)
MCV: 95 fL (ref 80.0–100.0)
Platelets: 324 10*3/uL (ref 150–400)
RBC: 3.77 MIL/uL — ABNORMAL LOW (ref 4.22–5.81)
RDW: 13.9 % (ref 11.5–15.5)
WBC: 14.6 10*3/uL — ABNORMAL HIGH (ref 4.0–10.5)
nRBC: 0 % (ref 0.0–0.2)

## 2019-03-31 LAB — TROPONIN I: Troponin I: 1.36 ng/mL (ref <0.03)

## 2019-03-31 LAB — HEPARIN LEVEL (UNFRACTIONATED): Heparin Unfractionated: <0.1 IU/mL — ABNORMAL LOW (ref 0.30–0.70)

## 2019-03-31 MED ORDER — VANCOMYCIN HCL 10 G IV SOLR
1250.0000 mg | Freq: Once | INTRAVENOUS | Status: AC
  Administered 2019-04-01: 1250 mg via INTRAVENOUS
  Filled 2019-03-31: qty 1250

## 2019-03-31 MED ORDER — SODIUM CHLORIDE 0.9 % IV SOLN
2.0000 g | Freq: Three times a day (TID) | INTRAVENOUS | Status: DC
  Administered 2019-03-31 – 2019-04-01 (×3): 2 g via INTRAVENOUS
  Filled 2019-03-31 (×5): qty 2

## 2019-03-31 MED ORDER — HEPARIN BOLUS VIA INFUSION
2000.0000 [IU] | Freq: Once | INTRAVENOUS | Status: AC
  Administered 2019-03-31: 2000 [IU] via INTRAVENOUS
  Filled 2019-03-31: qty 2000

## 2019-03-31 MED ORDER — DILTIAZEM HCL 60 MG PO TABS
60.0000 mg | ORAL_TABLET | Freq: Four times a day (QID) | ORAL | Status: DC
  Administered 2019-03-31 – 2019-04-04 (×16): 60 mg via ORAL
  Filled 2019-03-31 (×22): qty 1

## 2019-03-31 MED ORDER — APIXABAN 5 MG PO TABS
5.0000 mg | ORAL_TABLET | Freq: Two times a day (BID) | ORAL | Status: DC
  Administered 2019-03-31 – 2019-04-01 (×3): 5 mg via ORAL
  Filled 2019-03-31 (×4): qty 1

## 2019-03-31 MED ORDER — FLUTICASONE FUROATE-VILANTEROL 200-25 MCG/INH IN AEPB
1.0000 | INHALATION_SPRAY | Freq: Every day | RESPIRATORY_TRACT | Status: DC
  Administered 2019-04-01: 1 via RESPIRATORY_TRACT
  Filled 2019-03-31: qty 28

## 2019-03-31 MED ORDER — ENSURE ENLIVE PO LIQD
237.0000 mL | Freq: Two times a day (BID) | ORAL | Status: DC
  Administered 2019-03-31 – 2019-04-01 (×2): 237 mL via ORAL

## 2019-03-31 MED ORDER — IPRATROPIUM-ALBUTEROL 0.5-2.5 (3) MG/3ML IN SOLN
3.0000 mL | Freq: Four times a day (QID) | RESPIRATORY_TRACT | Status: DC
  Administered 2019-03-31 – 2019-04-01 (×5): 3 mL via RESPIRATORY_TRACT
  Filled 2019-03-31 (×5): qty 3

## 2019-03-31 MED ORDER — ADULT MULTIVITAMIN W/MINERALS CH
1.0000 | ORAL_TABLET | Freq: Every day | ORAL | Status: DC
  Administered 2019-03-31 – 2019-04-01 (×2): 1 via ORAL
  Filled 2019-03-31 (×2): qty 1

## 2019-03-31 MED ORDER — CHLORHEXIDINE GLUCONATE CLOTH 2 % EX PADS
6.0000 | MEDICATED_PAD | Freq: Every day | CUTANEOUS | Status: DC
  Administered 2019-03-31 – 2019-04-09 (×9): 6 via TOPICAL

## 2019-03-31 MED ORDER — LORAZEPAM 2 MG/ML IJ SOLN
2.0000 mg | INTRAMUSCULAR | Status: DC | PRN
  Administered 2019-04-01 – 2019-04-03 (×6): 2 mg via INTRAVENOUS
  Filled 2019-03-31 (×6): qty 1

## 2019-03-31 MED ORDER — GUAIFENESIN-DM 100-10 MG/5ML PO SYRP
5.0000 mL | ORAL_SOLUTION | ORAL | Status: DC | PRN
  Administered 2019-03-31: 5 mL via ORAL
  Filled 2019-03-31 (×2): qty 5

## 2019-03-31 MED ORDER — VANCOMYCIN HCL 10 G IV SOLR
1250.0000 mg | INTRAVENOUS | Status: DC
  Filled 2019-03-31: qty 1250

## 2019-03-31 NOTE — Telephone Encounter (Signed)
Referral was put in as an order so it never came to referral workqueue which then would have been given to [facility]

## 2019-03-31 NOTE — TOC Initial Note (Signed)
Transition of Care [facility]) - Initial/Assessment Note    Patient Details  Name: Joshua Whitaker MRN: [id number] Date of Birth: [birth year]  Transition of Care [facility]) CM/SW Contact:    Shade Flood, LCSW Phone Number: 03/31/2019, 9:56 AM  Clinical Narrative:                 Reviewed pt's record today. Pt lives with his wife, Joshua Whitaker. Spoke with Joshua Whitaker by phone. She states that when pt is well, he is independent in ADLs at home but prior to this admission, he was so weak he couldn't do anything for himself. Pt does use O2 at home. He does not have any other DME currently. It appears pt was possibly referred for [facility] by PCP office but pt's wife states she never got final word on whether it was set up or not and she does not know which [facility] agency received the referral. Will reach out to PCP office to inquire.   Will follow and assist with TOC needs.  Expected Discharge Plan: [place] Barriers to Discharge: Continued Medical Work up   Patient Goals and CMS Choice        Expected Discharge Plan and Services Expected Discharge Plan: [place]       Living arrangements for the past 2 months: Single Family Home                          Prior Living Arrangements/Services Living arrangements for the past 2 months: Single Family Home Lives with:: Spouse Patient language and need for interpreter reviewed:: Yes Do you feel safe going back to the place where you live?: Yes      Need for Family Participation in Patient Care: Yes (Comment) Care giver support system in place?: Yes (comment)   Criminal Activity/Legal Involvement Pertinent to Current Situation/Hospitalization: No - Comment as needed  Activities of Daily Living Home Assistive Devices/Equipment: Oxygen, Cane (specify quad or straight) ADL Screening (condition at time of admission) Patient's cognitive ability adequate to safely complete daily activities?: Yes Is the patient deaf or have  difficulty hearing?: No Does the patient have difficulty seeing, even when wearing glasses/contacts?: No Does the patient have difficulty concentrating, remembering, or making decisions?: No Patient able to express need for assistance with ADLs?: Yes Does the patient have difficulty dressing or bathing?: No Independently performs ADLs?: Yes (appropriate for developmental age) Does the patient have difficulty walking or climbing stairs?: Yes Weakness of Legs: Both Weakness of Arms/Hands: Both  Permission Sought/Granted                  Emotional Assessment Appearance:: Appears stated age     Orientation: : Oriented to Self, Oriented to Place, Oriented to  Time, Oriented to Situation Alcohol / Substance Use: Tobacco Use, Alcohol Use Psych Involvement: No (comment)  Admission diagnosis:  Elevated troponin [R79.89] Atrial flutter, unspecified type (HCC) [I48.92] Chronic obstructive pulmonary disease, unspecified COPD type ([place]) [J44.9] Community acquired pneumonia of left lung, unspecified part of lung [J18.9] Patient Active Problem List   Diagnosis Date Noted  . Atrial flutter ([place]) 03/30/2019  . Acute on chronic respiratory failure with hypoxia ([place]) 03/16/2019  . Alcohol abuse 03/16/2019  . Tobacco abuse 03/16/2019  . Medically noncompliant 03/16/2019  . Pressure injury of skin 03/14/2019  . Coronary artery calcification 03/14/2019  . Aortic atherosclerosis ([place]) 03/14/2019  . COPD exacerbation ([place])   . Hyponatremia   .  ST elevation   . Elevated troponin   . Nodule of right lung 10/12/2015  . H/O prostate cancer 04/21/2014  . Essential hypertension 03/20/2011  . Hyperlipidemia 03/20/2011  . Panlobular emphysema ([place]) 03/20/2011   PCP:  [name], MD Pharmacy:   [facility], [place] [address] Phone: [phone] Fax: [phone]     Social Determinants of Health (SDOH) Interventions     Readmission Risk Interventions No flowsheet data found.

## 2019-03-31 NOTE — Progress Notes (Addendum)
Xcover   Called by RN to evaluate patient for o2 sat 70's.   Pt states sob stable. Denies cp, palp, fever, chills.   Pt has been placed on bipap with o2 sat 97% on FIo2 30% Pt appears comfortable currently.   Exam: afebrile , P 76  Bp 148/   Pox 97%  Heent: anicteric Neck:   No jvd Heart: rrr s1, s2 Lung: slight bibasilar crackles, slight bilateral exp wheezing.  Abd: soft Ext: no c/c/e  A/P Acute hypoxic respiratory failure on chronic respiratory failure secondary to Copd exacerbation/ possible pneumonia CXR stat Bipap per respiratory protocol Cont solumedrol Cont Duoneb Cont Breo Elipta Consider Abx depending upon CXR results  Hx of ETOH abuse ? CIWA   Addendum   CXR =>right middle lung infiltrate and small right effusion Start Vanco / cefepime for Hcap (recent admission 03/16/19)  Critical care time 30 minutes

## 2019-03-31 NOTE — Progress Notes (Signed)
RT called to room for patient sats dropping into the 70's. Upon arrival to room patient seemed to have increased WOB with sats of 77%. RT increased O2 to 10L on HFNC and gave breathing treatment with improvement of sats to 99%. After treatment and replacement of HFNC sats dropped again into the 70's. RT placed patient on NRB with improvement of sats to 100%. Soon after patient was placed on BIPAP with settings as follows: 12/6 R8 FIO2 30%. Patient sats 99-100% and WOB decreased. Patient now resting comfortably on BIPAP. MD was notified by RN. Will continue to monitor.

## 2019-03-31 NOTE — Progress Notes (Signed)
Pharmacy Antibiotic Note  Joshua Whitaker is a 78 y.o. male admitted on 03/30/2019 with pneumonia.  Pharmacy has been consulted for vanco and cefepime dosing.  Plan: Vancomycin 1250 mg IV every 24 hours.  Goal trough 15-20 mcg/mL.  Cefepime 2000 mg IV every 8 hours. Monitor labs, c/s, and vanco levels as indicated.  Height: 5\' 10"  (177.8 cm) Weight: 135 lb 12.9 oz (61.6 kg) IBW/kg (Calculated) : 73  Temp (24hrs), Avg:97.7 F (36.5 C), Min:97.5 F (36.4 C), Max:97.8 F (36.6 C)  Recent Labs  Lab 03/30/19 1646 03/31/19 0413  WBC 21.4* 14.6*  CREATININE 0.83 0.68    Estimated Creatinine Clearance: 69.5 mL/min (by C-G formula based on SCr of 0.68 mg/dL).    Allergies  Allergen Reactions  . Tiotropium Bromide Monohydrate Other (See Comments)    Severe urinary retention  . Varenicline Other (See Comments)    Psychiatric and dreams  . Iodinated Diagnostic Agents   . Statins     Antimicrobials this admission: Cefepime 4/7 >>  Vanco 4/7 >>   Dose adjustments this admission: N/A  Microbiology results:  4/6 MRSA PCR: negative  Thank you for allowing pharmacy to be a part of this patient's care.  Ramond Craver 03/31/2019 10:27 PM

## 2019-03-31 NOTE — Progress Notes (Signed)
PROGRESS NOTE    Joshua Whitaker  HGD:[id number] DOB: 09-19-1941 DOA: 03/30/2019 PCP: [name], MD    Brief Narrative:  78 year old male who presented with dyspnea.  He does have significant past medical history for COPD, alcohol, tobacco abuse, and hypertension.  He reported 2 days of dyspnea, no frank chest pain.  His initial physical examination his temperature was 98, blood pressure 119/82, heart rate 158-161, respiratory rate 29, oxygen saturation 97%, heart S1-S2 present, tachycardic, regular, lungs with diffuse bilateral wheezing, abdomen soft, no lower extremity edema.  140, potassium 4.5, chloride 105, bicarb 24, glucose 91, BUN 31, creatinine 0.83, troponin I 0.12, white count 21.4, hemoglobin 12.3, hematocrit 38.3, platelets 350.  Alcohol level less than 10.  His chest radiograph had hyperinflation, increased lung markings bilaterally, chronic left upper lobe scaring, hilar congestion more prominent on the left.  EKG had 150 bpm, normal axis, 2:1 atrial flutter, V5 ST segment depression, no significant T wave abnormalities, positive PVCs.  Repeat EKG atrial flutter with variable block.   Patient was admitted to the hospital with a working diagnosis COPD exacerbation, complicated by new onset atrial flutter and elevated troponins.  Assessment & Plan:   Active Problems:   Atrial flutter ([place])   1. Acute COPD exacerbation with acute on chronic hypoxic respiratory failure. Patient continue to have dyspnea and wheezing, will continue bronchodilator therapy with duoneb q 6 H, oxymetry supplementation, and systemic steroids. Add inhaled corticosteroid. Chest film with chronic scaring at the left upper lobe, hilar congestion on the left. No clear pneumonic infiltrate, will hold on antibiotic therapy for now (ruled out pneumonia). Currently oxygen saturation is 87% to 93% on 4 LPM per [place], at home uses 2 LPM.   Patient with no fever and not known COVID contact, low pretest probability.    2. Atrial flutter with variable block (NEW). Patient continue to be in atrial flutter rhythm, rate has been less than 100, on 15 ml/H diltiazem drip, will start transitioning to oral diltiazem and will continue anticoagulation with apixaban. Positive troponin elevation, likely due to demand ischemia. Recent echocardiogram with preserved LV systolic function. No active chest pain, no ischemic changes in his EKG.   3. Etho abuse. No active signs of withdrawal, will continue neuro checks. Will have low threshold to start patient on benzodiazepines. Consult physical therapy and nutrition.   4. HTN. Continue blood pressure monitoring.   5. Reactive leukocytosis. WBC trending down, follow on cell count in am, continue to hold on antibiotic therapy for now.   DVT prophylaxis: apixaban  Code Status: full Family Communication: no family at the bedside  Disposition Plan/ discharge barriers: pending clinical improvement.   Body mass index is 19.49 kg/m. Malnutrition Type:      Malnutrition Characteristics:      Nutrition Interventions:     RN Pressure Injury Documentation:     Consultants:   Cardiology   Procedures:     Antimicrobials:       Subjective: Patient continue to have dyspnea and cough, no chest pain, no nausea or vomiting, very weak and deconditioned. Poor mobility and poor appetite at home. No fevers.   Objective: Vitals:   03/31/19 0600 03/31/19 0615 03/31/19 0630 03/31/19 0700  BP: (!) 108/56 108/62 107/61 108/63  Pulse: 79 79 78 77  Resp: (!) 24 (!) 23 (!) 25 (!) 24  Temp:      TempSrc:      SpO2: 98% 99% 93% (!) 87%  Weight: 61.6 kg  Height:        Intake/Output Summary (Last 24 hours) at 03/31/2019 0813 Last data filed at 03/31/2019 0720 Gross per 24 hour  Intake 1058.15 ml  Output 125 ml  Net 933.15 ml   Filed Weights   03/30/19 1626 03/30/19 2300 03/31/19 0600  Weight: 64.4 kg 60.8 kg 61.6 kg    Examination:   General: positive  dyspnea and deconditioned  Neurology: Awake and alert, non focal  E ENT: positive pallor, no icterus, oral mucosa moist Cardiovascular: No JVD. S1-S2 present, rhythmic, no gallops, rubs, or murmurs. No lower extremity edema. Pulmonary: positive breath sounds bilaterally, decreased air movement, positive expiratory and inspiratory wheezing, scattered rhonchi or rales. Gastrointestinal. Abdomen with no organomegaly, non tender, no rebound or guarding Skin. No rashes Musculoskeletal: no joint deformities     Data Reviewed: I have personally reviewed following labs and imaging studies  CBC: Recent Labs  Lab 03/30/19 1646 03/31/19 0413  WBC 21.4* 14.6*  HGB 12.3* 11.3*  HCT 38.3* 35.8*  MCV 95.0 95.0  PLT 350 595   Basic Metabolic Panel: Recent Labs  Lab 03/30/19 1646 03/31/19 0413  NA 140 137  K 4.5 4.6  CL 105 105  CO2 24 23  GLUCOSE 91 130*  BUN 31* 27*  CREATININE 0.83 0.68  CALCIUM 8.5* 8.0*   GFR: Estimated Creatinine Clearance: 69.5 mL/min (by C-G formula based on SCr of 0.68 mg/dL). Liver Function Tests: Recent Labs  Lab 03/30/19 1657 03/31/19 0413  AST 74* 47*  ALT 92* 75*  ALKPHOS 117 97  BILITOT 0.3 0.3  PROT 6.3* 5.7*  ALBUMIN 3.0* 2.5*   No results for input(s): LIPASE, AMYLASE in the last 168 hours. No results for input(s): AMMONIA in the last 168 hours. Coagulation Profile: No results for input(s): INR, PROTIME in the last 168 hours. Cardiac Enzymes: Recent Labs  Lab 03/30/19 1646 03/30/19 1833 03/30/19 2251 03/31/19 0413  TROPONINI 1.12* 1.14* 1.45* 1.36*   BNP (last 3 results) No results for input(s): PROBNP in the last 8760 hours. HbA1C: No results for input(s): HGBA1C in the last 72 hours. CBG: Recent Labs  Lab 03/30/19 2147  GLUCAP 80   Lipid Profile: No results for input(s): CHOL, HDL, LDLCALC, TRIG, CHOLHDL, LDLDIRECT in the last 72 hours. Thyroid Function Tests: No results for input(s): TSH, T4TOTAL, FREET4, T3FREE,  THYROIDAB in the last 72 hours. Anemia Panel: No results for input(s): VITAMINB12, FOLATE, FERRITIN, TIBC, IRON, RETICCTPCT in the last 72 hours.    Radiology Studies: I have reviewed all of the imaging during this hospital visit personally     Scheduled Meds: . atorvastatin  80 mg Oral q1800  . Chlorhexidine Gluconate Cloth  6 each Topical Q2200  . clopidogrel  75 mg Oral Daily  . escitalopram  5 mg Oral Daily  . folic acid  1 mg Oral Daily  . ipratropium-albuterol  3 mL Nebulization Q6H WA  . mouth rinse  15 mL Mouth Rinse BID  . methylPREDNISolone (SOLU-MEDROL) injection  60 mg Intravenous Q6H  . thiamine  100 mg Oral Daily   Continuous Infusions: . sodium chloride 10 mL/hr at 03/31/19 0300  . diltiazem (CARDIZEM) infusion 15 mg/hr (03/31/19 0437)  . heparin 1,100 Units/hr (03/31/19 0531)  . levofloxacin (LEVAQUIN) IV       LOS: 1 day        Aquan Kope Gerome Apley, MD

## 2019-03-31 NOTE — Progress Notes (Signed)
ANTICOAGULATION CONSULT NOTE - Follow Up Consult  Pharmacy Consult for heparin Indication: Aflutter  Labs: Recent Labs    03/30/19 1646 03/30/19 1833 03/30/19 2251 03/31/19 0413  HGB 12.3*  --   --  11.3*  HCT 38.3*  --   --  35.8*  PLT 350  --   --  324  APTT 33  --   --   --   HEPARINUNFRC <0.10*  --   --  <0.10*  CREATININE 0.83  --   --   --   TROPONINI 1.12* 1.14* 1.45*  --     Assessment: 77yo male subtherapeutic on heparin with initial dosing for new Aflutter; no gtt issues or signs of bleeding per RN.  Goal of Therapy:  Heparin level 0.3-0.7 units/ml   Plan:  Will rebolus with heparin 2000 units and increase heparin gtt by 3-4 units/kg/hr to 1100 units/hr and check level in 8 hours.    Wynona Neat, PharmD, BCPS  03/31/2019,5:26 AM

## 2019-03-31 NOTE — Consult Note (Addendum)
Cardiology Consult    Patient ID: Joshua Whitaker; [id number]; [birth year]   Admit date: 03/30/2019 Date of Consult: 03/31/2019  Primary Care Provider: [name], MD Primary Cardiologist: Candee Furbish, MD   Patient Profile    Joshua Whitaker is a 78 y.o. male with past medical history of COPD, chronic hypoxic respiratory failure (on 4L [place] at baseline), HTN, prostate cancer, tobacco use and alcohol abuse who is being seen today for the evaluation of new-onset atrial flutter and elevated troponin values at the request of Dr. Darrick Meigs.   History of Present Illness    Mr. Joshua Whitaker was recently admitted to [facility] from 3/20 - 03/16/2019 for acute on chronic hypoxic respiratory failure in the setting of a COPD exacerbation. Was also found to be significantly hyponatremic with Na+ at 109. Cardiology was consulted during admission as his initial EKG showed ST elevation along the inferior leads but this was reviewed with the Interventional Team (Dr. Ellyn Hack) and an urgent cath was not pursued as he was close to needing intubation at the time of arrival and the patient declined and made himself DNR/DNI (changed later during admission as he was in agreement for CPR but declined intubation). Troponin values remained flat that admission, peaking at 0.06. He was initially treated with Heparin but this was transitioned to Plavix 75mg  daily given the concern for underlying 3-vessel CAD.   He presented back to [facility] ED on 03/30/2019 for evaluation of worsening dyspnea starting earlier that morning. Denied any associated chest pain or palpitations.  Also states he has had an intermittently productive cough but no obvious fevers or chills.  He lives at home with his wife, chart review finds recent messages to PCP with concerns from wife regarding patient's declining health and question of possible "stroke."  While in the ED, he was found to be in new-onset atrial flutter with rates in the 160's. He was  started on IV Cardizem for rate-control. Initial labs show WBC 21.4, Hgb 12.3, platelets 350, Na+ 140, K+ 4.5, and creatinine 0.83. Troponin values this admission have been elevated at 1.12, 1.14, 1.45, and 1.36. CXR showed severe COPD with new patchy density along the mid and lower lungs thought to be most consistent with PNA. Has been started on Levaquin and repeat WBC this morning has improved to 14.6.  Past Medical History:  Diagnosis Date   Alcohol abuse    Allergy    Anxiety    Asthma    COPD (chronic obstructive pulmonary disease) (HCC)    Coronary artery calcification seen on CT scan    Depression    Hyponatremia    Prostate cancer [facility])     Past Surgical History:  Procedure Laterality Date   EYE SURGERY     PROSTATE SURGERY     SPINE SURGERY       Home Medications:  Prior to Admission medications   Medication Sig Start Date End Date Taking? Authorizing Provider  atorvastatin (LIPITOR) 80 MG tablet Take 1 tablet (80 mg total) by mouth daily at 6 PM. 03/25/19  Yes Stacks, Cletus Gash, MD  clopidogrel (PLAVIX) 75 MG tablet Take 1 tablet (75 mg total) by mouth daily. 03/17/19  Yes Dessa Phi, DO  Fluticasone-Umeclidin-Vilant (TRELEGY ELLIPTA) 100-62.5-25 MCG/INH AEPB Inhale 1 puff into the lungs daily. 10/15/18  Yes Stacks, Cletus Gash, MD  Ipratropium-Albuterol (COMBIVENT RESPIMAT) 20-100 MCG/ACT AERS respimat Inhale 2 puffs into the lungs every 6 (six) hours. 10/15/18  Yes [name], MD  megestrol (MEGACE)  400 MG/10ML suspension Take 10 mLs (400 mg total) by mouth 2 (two) times daily. For appetite stimulation 03/23/19  Yes Stacks, Cletus Gash, MD  predniSONE (DELTASONE) 10 MG tablet Take 5 daily for 3 days followed by 4,3,2 and 1 for 3 days each. 03/23/19  Yes [name], MD  PROAIR HFA 108 [phone] Base) MCG/ACT inhaler Inhale 1-2 puffs into the lungs every 6 (six) hours as needed for wheezing or shortness of breath. Patient taking differently: Inhale 1-2 puffs into the lungs  every 6 (six) hours as needed for wheezing or shortness of breath.  01/30/19  Yes Hassell Done, Mary-Margaret, FNP  cholestyramine Lucrezia Starch) 4 g packet  12/02/17   [provider]  escitalopram (LEXAPRO) 5 MG tablet Take 5 mg by mouth every morning. 03/10/19   [provider]  fluticasone (FLONASE) 50 MCG/ACT nasal spray Place 1 spray into both nostrils daily. 10/15/18   [name], MD  folic acid (FOLVITE) 1 MG tablet Take 1 tablet (1 mg total) by mouth daily. 03/17/19   Dessa Phi, DO  thiamine 100 MG tablet Take 1 tablet (100 mg total) by mouth daily. 03/17/19   Dessa Phi, DO    Inpatient Medications: Scheduled Meds:  apixaban  5 mg Oral BID   atorvastatin  80 mg Oral q1800   Chlorhexidine Gluconate Cloth  6 each Topical Q2200   clopidogrel  75 mg Oral Daily   diltiazem  60 mg Oral Q6H   escitalopram  5 mg Oral Daily   fluticasone furoate-vilanterol  1 puff Inhalation Daily   folic acid  1 mg Oral Daily   ipratropium-albuterol  3 mL Nebulization Q6H WA   mouth rinse  15 mL Mouth Rinse BID   methylPREDNISolone (SOLU-MEDROL) injection  60 mg Intravenous Q6H   thiamine  100 mg Oral Daily   Continuous Infusions:  sodium chloride 10 mL/hr at 03/31/19 0300   diltiazem (CARDIZEM) infusion 5 mg/hr (03/31/19 0929)   PRN Meds: acetaminophen **OR** acetaminophen, guaiFENesin-dextromethorphan, ondansetron **OR** ondansetron (ZOFRAN) IV  Allergies:    Allergies  Allergen Reactions   Tiotropium Bromide Monohydrate Other (See Comments)    Severe urinary retention   Varenicline Other (See Comments)    Psychiatric and dreams   Iodinated Diagnostic Agents    Statins     Social History:   Social History   Socioeconomic History   Marital status: Married    Spouse name: Not on file   Number of children: 3   Years of education: 5th Grade and GED   Highest education level: GED or equivalent  Occupational History   Occupation: Retired     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 all   Food insecurity:    Worry: Never true    Inability: Never true   Transportation needs:    Medical: No    Non-medical: No  Tobacco Use   Smoking status: Current Every Day Smoker    Packs/day: 1.00    Types: Cigarettes   Smokeless tobacco: Never Used  Substance and Sexual Activity   Alcohol use: Yes   Drug use: No   Sexual activity: Not Currently  Lifestyle   Physical activity:    Days per week: 5 days    Minutes per session: 30 min   Stress: Only a little  Relationships   Social connections:    Talks on phone: More than three times a week    Gets together: More than three times a  week    Attends religious service: Never    Active member of club or organization: No    Attends meetings of clubs or organizations: Never    Relationship status: Not on file   Intimate partner violence:    Fear of current or ex partner: No    Emotionally abused: No    Physically abused: No    Forced sexual activity: No  Other Topics Concern   Not on file  Social History Narrative   Not on file     Family History:    Family History  Problem Relation Age of Onset   Heart disease Mother    Arthritis Mother    Heart disease Father    Cancer Sister        kidney   Cancer Brother        lung   Cancer Brother        lung   Stroke Brother    Cancer Brother        prostate   Cancer Sister        colon   Cancer Sister        bone   Diabetes Sister    Chromosomal disorder Sister    Alcohol abuse Daughter    Rheum arthritis Daughter    Rheum arthritis Daughter       Review of Systems    General:  No chills, fever, night sweats or weight changes.  Cardiovascular:  No chest pain,  edema, orthopnea, palpitations, paroxysmal nocturnal dyspnea. Positive for dyspnea on exertion.  Dermatological: No rash, lesions/masses Respiratory: No cough, Positive for  dyspnea. Urologic: No hematuria, dysuria Abdominal:   No nausea, vomiting, diarrhea, bright red blood per rectum, melena, or hematemesis Neurologic:  No visual changes, wkns, changes in mental status. All other systems reviewed and are otherwise negative except as noted above.  Physical Exam/Data    Vitals:   03/31/19 0600 03/31/19 0615 03/31/19 0630 03/31/19 0700  BP: (!) 108/56 108/62 107/61 108/63  Pulse: 79 79 78 77  Resp: (!) 24 (!) 23 (!) 25 (!) 24  Temp:      TempSrc:      SpO2: 98% 99% 93% (!) 87%  Weight: 61.6 kg     Height:        Intake/Output Summary (Last 24 hours) at 03/31/2019 0933 Last data filed at 03/31/2019 0720 Gross per 24 hour  Intake 1058.15 ml  Output 125 ml  Net 933.15 ml   Filed Weights   03/30/19 1626 03/30/19 2300 03/31/19 0600  Weight: 64.4 kg 60.8 kg 61.6 kg   Body mass index is 19.49 kg/m.   General: Elderly male in no acute distress but short of breath when speaking long sentences. Psych: Somewhat flat affect. Neuro: Alert and oriented X 3. Moves all extremities spontaneously. HEENT: Normal  Neck: Supple without bruits or JVD. Lungs: Coarse breath sounds throughout with scattered rhonchi and prolonged expiratory phase.  On 4L [place].  Heart: Distant heart sounds, currently regular with soft systolic murmur and no obvious gallop. Abdomen: Soft, non-tender, non-distended, BS + x 4.  Extremities: No clubbing, cyanosis or edema. DP/PT/Radials 2+ and equal bilaterally.   EKG:  The EKG was personally reviewed and demonstrates: Atrial flutter, HR 126, with nonspecific ST abnormality along inferior leads.    Labs/Studies    Relevant CV Studies:  Echocardiogram: 03/14/2019 IMPRESSIONS   1. The left ventricle has low normal systolic function, with an ejection fraction of 50-55%. The cavity size  was normal. Left ventricular diastolic Doppler parameters are consistent with pseudonormal. Elevated mean left atrial pressure No evidence of  left  ventricular regional wall motion abnormalities.  2. Small-to-moderate circumferential pericardial effusion, without evidence of tamponade.  3. Increased respiratory flow variation appears related to increased work of breathing, rather than tamponade. The inferior vena cava collapses readily with breathing.  4. Pulmonic valve regurgitation was not assessed by color flow Doppler.  5. The inferior vena cava was dilated in size with >50% respiratory variability.  Laboratory Data:  Chemistry Recent Labs  Lab 03/30/19 1646 03/31/19 0413  NA 140 137  K 4.5 4.6  CL 105 105  CO2 24 23  GLUCOSE 91 130*  BUN 31* 27*  CREATININE 0.83 0.68  CALCIUM 8.5* 8.0*  GFRNONAA >60 >60  GFRAA >60 >60  ANIONGAP 11 9    Recent Labs  Lab 03/30/19 1657 03/31/19 0413  PROT 6.3* 5.7*  ALBUMIN 3.0* 2.5*  AST 74* 47*  ALT 92* 75*  ALKPHOS 117 97  BILITOT 0.3 0.3   Hematology Recent Labs  Lab 03/30/19 1646 03/31/19 0413  WBC 21.4* 14.6*  RBC 4.03* 3.77*  HGB 12.3* 11.3*  HCT 38.3* 35.8*  MCV 95.0 95.0  MCH 30.5 30.0  MCHC 32.1 31.6  RDW 14.0 13.9  PLT 350 324   Cardiac Enzymes Recent Labs  Lab 03/30/19 1646 03/30/19 1833 03/30/19 2251 03/31/19 0413  TROPONINI 1.12* 1.14* 1.45* 1.36*   No results for input(s): TROPIPOC in the last 168 hours.  BNPNo results for input(s): BNP, PROBNP in the last 168 hours.  DDimer No results for input(s): DDIMER in the last 168 hours.  Radiology/Studies:  Dg Chest 2 View  Result Date: 03/30/2019 CLINICAL DATA:  Shortness of breath which is recurrent but intermittent. EXAM: CHEST - 2 VIEW COMPARISON:  03/13/2019.  01/06/2019.  Chest CT 04/30/2018. FINDINGS: Cardiomegaly and aortic atherosclerosis as seen previously. Emphysema, upper lobe predominant. Pulmonary scarring. Focal density in the left upper lobe as seen previously, felt to represent scarring by CT. Increased patchy density in the mid and lower lungs not seen previously that could represent  superimposed pneumonia. IMPRESSION: Background severe COPD. Newly seen patchy density in the mid and lower lungs left more than right that could represent overlying acute pneumonia. Electronically Signed   By: Nelson Chimes M.D.   On: 03/30/2019 17:50     Assessment & Plan    1. New-Onset Atrial Flutter - new diagnosis for the patient this admission granted he did not report any palpitations while in the arrhythmia. Electrolytes WNL and TSH at 1.121 last admission. Suspect his episode was likely triggered by his acute respiratory illness. - he was started on IV Cardizem for rate-control which can likely be transitioned to PO Cardizem later today. EF was 50-55% by echo last month. BB therapy not ideal given his severe COPD but could consider Bisoprolol in the future if needed.  - This patients CHA2DS2-VASc Score and unadjusted Ischemic Stroke Rate (% per year) is equal to 3.2 % stroke rate/year from a score of 3 (Aortic Plaque, Age (2)).  Initially placed on Heparin and transitioned to Eliquis this morning by hospitalist team.  I do have some reservations about long-term anticoagulation in this patient with recurring alcohol abuse and risk for bleeding, however it is not unreasonable to start therapy at this time - he will need close follow-up.  Although his atrial flutter may have been precipitated by an upper respiratory illness, he certainly is at  risk for recurring atrial arrhythmias over time.  With his wife's recent reported concerns about "stroke" based on review of the chart, head CT also being obtained and if there is evidence of recent or prior infarcts, that would even further support efforts at longer-term anticoagulation.  2. Elevated Troponin - Troponin values this admission have been elevated at 1.12, 1.14, 1.45, and 1.36. He reports dyspnea but denies any recent chest pain. EKG this admission shows no obvious acute ST segment changes.  - He was placed on Plavix during his most recent  admission with minor troponin I elevation and previously documented multivessel coronary artery calcification by CT imaging and therefore likelihood of some degree of underlying obstructive CAD.  Plan to continue for now along with statin therapy.  Further ischemic testing is not planned at this time.  3. Pericardial Effusion - small to moderate by recent echocardiogram on March 21 and without evidence of tamponade at that time.  This will ultimately need to be re-imaged to ensure stability/improvement.  4. COPD/Acute Hypoxic Respiratory Failure - on 4L [place] at baseline. Has been started on IV steroids and antibiotic therapy. - per admitting team.  Question of possible pneumonia by chest x-ray, he is afebrile at this time.  5. HLD - FLP last admission showed total cholesterol of 104, HDL 51, and LDL 48. - continue Atorvastatin 80mg  daily.   For questions or updates, please contact [facility] Please consult [email or web address] for contact info under Cardiology/STEMI.  Signed, Rozann Lesches, MD 03/31/2019, 9:33 AM

## 2019-03-31 NOTE — Progress Notes (Addendum)
Initial Nutrition Assessment   RD working remotely.   DOCUMENTATION CODES:     INTERVENTION:   Heart Healthy diet   Ensure Enlive po TID with meals due to poor meal intake    NUTRITION DIAGNOSIS:   Increased nutrient needs related to acute illness, chronic illness, poor appetite, (acute exacerbation of chronic COPD and presence of daily substance abuse ) as evidenced by estimated needs, current meal completion < 50% which is meeting less than half of his requirements.   GOAL:  Patient will meet greater than or equal to 90% of their needs   MONITOR:   PO intake, Supplement acceptance, Weight trends, Labs  REASON FOR ASSESSMENT:   Consult Assessment of nutrition requirement/status  ASSESSMENT: Patient presents from home with 2-days increased shortness of breath/ exacerbation of COPD. Additional hx of HTN, alcohol and tobacco abuse.   Meal intake: 25% -able to feed self based on chart review. Unable to interview patient or get in touch with RN at this time. Called his wife [name] and left message to call RD.  Weight history shows a range of 61-65 kg the past 14 months. No significant fluctuations noted.   Expect element of malnutrition in this patient given his history of COPD in addition to chronic substance abuse. Will continue to follow and further define as additional information critical to diagnosis is available.  Labs: BUN 27 (H), Glucose 130 (H), Albumin 2.5 (L). WBC-14.6. Troponin 1.36 (H).  Medications reviewed and include: lipitor, plavix, cardizem, folic acid, thiamine, prednisone  NUTRITION - FOCUSED PHYSICAL EXAM:  Unable to perform  Diet Order:   Diet Order            Diet Heart Room service appropriate? Yes; Fluid consistency: Thin  Diet effective now              EDUCATION NEEDS:   Not appropriate for education at this time Skin:  Skin Assessment: Reviewed RN Assessment(dry skin and heels cracking bilaterally)  Last BM:  4/5 type 6 - small    Height:   Ht Readings from Last 1 Encounters:  03/30/19 5\' 10"  (1.778 m)    Weight:   Wt Readings from Last 1 Encounters:  03/31/19 61.6 kg    Ideal Body Weight:  75 kg  BMI:  Body mass index is 19.49 kg/m.  Estimated Nutritional Needs:   Kcal:  1984-2170 (32-35 KCAL/KG/BW)  Protein:  87-99 (1.4-1.6 gr/kg/bw)  Fluid:  1900 ml daily   Colman Cater MS,RD,CSG,LDN Office: [phone] Pager: [phone]

## 2019-04-01 ENCOUNTER — Inpatient Hospital Stay (HOSPITAL_COMMUNITY): Payer: Medicare Other

## 2019-04-01 ENCOUNTER — Inpatient Hospital Stay (HOSPITAL_COMMUNITY): Payer: Medicare Other | Admitting: Anesthesiology

## 2019-04-01 DIAGNOSIS — F17200 Nicotine dependence, unspecified, uncomplicated: Secondary | ICD-10-CM

## 2019-04-01 DIAGNOSIS — J9622 Acute and chronic respiratory failure with hypercapnia: Secondary | ICD-10-CM

## 2019-04-01 DIAGNOSIS — J181 Lobar pneumonia, unspecified organism: Secondary | ICD-10-CM

## 2019-04-01 DIAGNOSIS — J9620 Acute and chronic respiratory failure, unspecified whether with hypoxia or hypercapnia: Secondary | ICD-10-CM | POA: Diagnosis present

## 2019-04-01 DIAGNOSIS — F1010 Alcohol abuse, uncomplicated: Secondary | ICD-10-CM

## 2019-04-01 DIAGNOSIS — Z9911 Dependence on respirator [ventilator] status: Secondary | ICD-10-CM

## 2019-04-01 LAB — RESPIRATORY PANEL BY PCR

## 2019-04-01 LAB — CBC WITH DIFFERENTIAL/PLATELET
Abs Immature Granulocytes: 0.06 10*3/uL (ref 0.00–0.07)
Basophils Absolute: 0 10*3/uL (ref 0.0–0.1)
Basophils Relative: 0 %
Eosinophils Absolute: 0 10*3/uL (ref 0.0–0.5)
Eosinophils Relative: 0 %
HCT: 34.9 % — ABNORMAL LOW (ref 39.0–52.0)
Hemoglobin: 11.4 g/dL — ABNORMAL LOW (ref 13.0–17.0)
Immature Granulocytes: 1 %
Lymphocytes Relative: 7 %
Lymphs Abs: 0.8 10*3/uL (ref 0.7–4.0)
MCH: 30.4 pg (ref 26.0–34.0)
MCHC: 32.7 g/dL (ref 30.0–36.0)
MCV: 93.1 fL (ref 80.0–100.0)
Monocytes Absolute: 0.3 10*3/uL (ref 0.1–1.0)
Monocytes Relative: 3 %
Neutro Abs: 10.3 10*3/uL — ABNORMAL HIGH (ref 1.7–7.7)
Neutrophils Relative %: 89 %
Platelets: 316 10*3/uL (ref 150–400)
RBC: 3.75 MIL/uL — ABNORMAL LOW (ref 4.22–5.81)
RDW: 14.2 % (ref 11.5–15.5)
WBC: 11.5 10*3/uL — ABNORMAL HIGH (ref 4.0–10.5)
nRBC: 0 % (ref 0.0–0.2)

## 2019-04-01 LAB — TRIGLYCERIDES: Triglycerides: 53 mg/dL (ref <150)

## 2019-04-01 LAB — INFLUENZA PANEL BY PCR (TYPE A & B)
Influenza A By PCR: NEGATIVE
Influenza B By PCR: NEGATIVE

## 2019-04-01 LAB — BASIC METABOLIC PANEL
Anion gap: 8 (ref 5–15)
BUN: 40 mg/dL — ABNORMAL HIGH (ref 8–23)
CO2: 24 mmol/L (ref 22–32)
Calcium: 8.2 mg/dL — ABNORMAL LOW (ref 8.9–10.3)
Chloride: 107 mmol/L (ref 98–111)
Creatinine, Ser: 0.82 mg/dL (ref 0.61–1.24)
GFR calc Af Amer: >60 mL/min (ref >60)
GFR calc non Af Amer: >60 mL/min (ref >60)
Glucose, Bld: 131 mg/dL — ABNORMAL HIGH (ref 70–99)
Potassium: 4.5 mmol/L (ref 3.5–5.1)
Sodium: 139 mmol/L (ref 135–145)

## 2019-04-01 LAB — POCT I-STAT 7, (LYTES, BLD GAS, ICA,H+H)
Acid-Base Excess: 1 mmol/L (ref 0.0–2.0)
Bicarbonate: 26.6 mmol/L (ref 20.0–28.0)
Calcium, Ion: 1.23 mmol/L (ref 1.15–1.40)
HCT: 30 % — ABNORMAL LOW (ref 39.0–52.0)
Hemoglobin: 10.2 g/dL — ABNORMAL LOW (ref 13.0–17.0)
O2 Saturation: 99 %
Patient temperature: 97.8
Potassium: 4.6 mmol/L (ref 3.5–5.1)
Sodium: 139 mmol/L (ref 135–145)
TCO2: 28 mmol/L (ref 22–32)
pCO2 arterial: 46.9 mmHg (ref 32.0–48.0)
pH, Arterial: 7.361 (ref 7.350–7.450)
pO2, Arterial: 173 mmHg — ABNORMAL HIGH (ref 83.0–108.0)

## 2019-04-01 LAB — D-DIMER, QUANTITATIVE: D-Dimer, Quant: 3.72 ug/mL-FEU — ABNORMAL HIGH (ref 0.00–0.50)

## 2019-04-01 LAB — FERRITIN: Ferritin: 356 ng/mL — ABNORMAL HIGH (ref 24–336)

## 2019-04-01 LAB — LACTATE DEHYDROGENASE: LDH: 167 U/L (ref 98–192)

## 2019-04-01 LAB — C-REACTIVE PROTEIN
CRP: 6.8 mg/dL — ABNORMAL HIGH (ref <1.0)
CRP: 6.5 mg/dL — ABNORMAL HIGH (ref <1.0)

## 2019-04-01 LAB — CK: Total CK: 40 U/L — ABNORMAL LOW (ref 49–397)

## 2019-04-01 LAB — GLUCOSE, CAPILLARY: Glucose-Capillary: 114 mg/dL — ABNORMAL HIGH (ref 70–99)

## 2019-04-01 LAB — PROCALCITONIN: Procalcitonin: <0.1 ng/mL

## 2019-04-01 MED ORDER — ALBUTEROL SULFATE (2.5 MG/3ML) 0.083% IN NEBU: 2.5000 mg | INHALATION_SOLUTION | RESPIRATORY_TRACT | Status: DC | PRN

## 2019-04-01 MED ORDER — BUDESONIDE 0.25 MG/2ML IN SUSP: 0.2500 mg | Freq: Four times a day (QID) | RESPIRATORY_TRACT | Status: DC

## 2019-04-01 MED ORDER — ACETAMINOPHEN 650 MG RE SUPP: 650.0000 mg | Freq: Four times a day (QID) | RECTAL | Status: DC | PRN

## 2019-04-01 MED ORDER — CHLORHEXIDINE GLUCONATE 0.12% ORAL RINSE (MEDLINE KIT)
15.0000 mL | Freq: Two times a day (BID) | OROMUCOSAL | Status: DC
  Administered 2019-04-01 – 2019-04-04 (×7): 15 mL via OROMUCOSAL

## 2019-04-01 MED ORDER — ORAL CARE MOUTH RINSE
15.0000 mL | OROMUCOSAL | Status: DC
  Administered 2019-04-01 – 2019-04-04 (×20): 15 mL via OROMUCOSAL

## 2019-04-01 MED ORDER — VITAMIN B-1 100 MG PO TABS
100.0000 mg | ORAL_TABLET | Freq: Every day | ORAL | Status: DC
  Administered 2019-04-02 – 2019-04-10 (×8): 100 mg
  Filled 2019-04-01 (×9): qty 1

## 2019-04-01 MED ORDER — ACETAMINOPHEN 325 MG PO TABS: 650.0000 mg | ORAL_TABLET | Freq: Four times a day (QID) | ORAL | Status: DC | PRN

## 2019-04-01 MED ORDER — BUDESONIDE 0.25 MG/2ML IN SUSP
0.2500 mg | Freq: Four times a day (QID) | RESPIRATORY_TRACT | Status: DC
  Administered 2019-04-01: 0.25 mg via RESPIRATORY_TRACT
  Filled 2019-04-01 (×2): qty 2

## 2019-04-01 MED ORDER — METHYLPREDNISOLONE SODIUM SUCC 125 MG IJ SOLR
80.0000 mg | Freq: Two times a day (BID) | INTRAMUSCULAR | Status: DC
  Administered 2019-04-02 – 2019-04-03 (×3): 80 mg via INTRAVENOUS
  Filled 2019-04-01 (×3): qty 2

## 2019-04-01 MED ORDER — PROPOFOL 1000 MG/100ML IV EMUL
INTRAVENOUS | Status: AC
  Administered 2019-04-01: 20 ug/kg/min via INTRAVENOUS
  Filled 2019-04-01: qty 100

## 2019-04-01 MED ORDER — SUCCINYLCHOLINE CHLORIDE 20 MG/ML IJ SOLN
INTRAMUSCULAR | Status: DC | PRN
  Administered 2019-04-01: 100 mg via INTRAVENOUS

## 2019-04-01 MED ORDER — METHYLPREDNISOLONE SODIUM SUCC 125 MG IJ SOLR: 60.0000 mg | Freq: Two times a day (BID) | INTRAMUSCULAR | Status: DC

## 2019-04-01 MED ORDER — ETOMIDATE 2 MG/ML IV SOLN
INTRAVENOUS | Status: DC | PRN
  Administered 2019-04-01: 10 mg via INTRAVENOUS

## 2019-04-01 MED ORDER — ATORVASTATIN CALCIUM 80 MG PO TABS
80.0000 mg | ORAL_TABLET | ORAL | Status: DC
  Administered 2019-04-01 – 2019-04-03 (×3): 80 mg
  Filled 2019-04-01 (×4): qty 1

## 2019-04-01 MED ORDER — BUDESONIDE 0.5 MG/2ML IN SUSP: 0.5000 mg | Freq: Two times a day (BID) | RESPIRATORY_TRACT | Status: DC

## 2019-04-01 MED ORDER — MIDAZOLAM HCL 2 MG/2ML IJ SOLN: 1.0000 mg | INTRAMUSCULAR | Status: DC | PRN

## 2019-04-01 MED ORDER — METHYLPREDNISOLONE SODIUM SUCC 125 MG IJ SOLR: 60.0000 mg | Freq: Four times a day (QID) | INTRAMUSCULAR | Status: DC

## 2019-04-01 MED ORDER — BUDESONIDE 0.25 MG/2ML IN SUSP: 0.2500 mg | Freq: Two times a day (BID) | RESPIRATORY_TRACT | Status: DC

## 2019-04-01 MED ORDER — IPRATROPIUM-ALBUTEROL 0.5-2.5 (3) MG/3ML IN SOLN
3.0000 mL | Freq: Four times a day (QID) | RESPIRATORY_TRACT | Status: DC
  Administered 2019-04-01 – 2019-04-04 (×11): 3 mL via RESPIRATORY_TRACT
  Filled 2019-04-01 (×11): qty 3

## 2019-04-01 MED ORDER — ADULT MULTIVITAMIN W/MINERALS CH
1.0000 | ORAL_TABLET | Freq: Every day | ORAL | Status: DC
  Administered 2019-04-02 – 2019-04-07 (×5): 1
  Filled 2019-04-01 (×6): qty 1

## 2019-04-01 MED ORDER — PANTOPRAZOLE SODIUM 40 MG IV SOLR
40.0000 mg | Freq: Every day | INTRAVENOUS | Status: DC
  Administered 2019-04-01: 40 mg via INTRAVENOUS
  Filled 2019-04-01: qty 40

## 2019-04-01 MED ORDER — BUDESONIDE 180 MCG/ACT IN AEPB
2.0000 | INHALATION_SPRAY | Freq: Two times a day (BID) | RESPIRATORY_TRACT | Status: DC
  Filled 2019-04-01: qty 1

## 2019-04-01 MED ORDER — MIDAZOLAM HCL 2 MG/2ML IJ SOLN
1.0000 mg | INTRAMUSCULAR | Status: DC | PRN
  Administered 2019-04-01: 1 mg via INTRAVENOUS
  Filled 2019-04-01: qty 2

## 2019-04-01 MED ORDER — CLOPIDOGREL BISULFATE 75 MG PO TABS
75.0000 mg | ORAL_TABLET | Freq: Every day | ORAL | Status: DC
  Administered 2019-04-02 – 2019-04-10 (×8): 75 mg
  Filled 2019-04-01 (×9): qty 1

## 2019-04-01 MED ORDER — SODIUM CHLORIDE 0.9 % IV SOLN
2.0000 g | Freq: Three times a day (TID) | INTRAVENOUS | Status: DC
  Administered 2019-04-01 – 2019-04-02 (×2): 2 g via INTRAVENOUS
  Filled 2019-04-01 (×5): qty 2

## 2019-04-01 MED ORDER — FOLIC ACID 1 MG PO TABS
1.0000 mg | ORAL_TABLET | Freq: Every day | ORAL | Status: DC
  Administered 2019-04-02 – 2019-04-10 (×8): 1 mg
  Filled 2019-04-01 (×9): qty 1

## 2019-04-01 MED ORDER — FENTANYL CITRATE (PF) 100 MCG/2ML IJ SOLN: 50.0000 ug | INTRAMUSCULAR | Status: DC | PRN

## 2019-04-01 MED ORDER — ENOXAPARIN SODIUM 40 MG/0.4ML ~~LOC~~ SOLN
40.0000 mg | SUBCUTANEOUS | Status: DC
  Administered 2019-04-01 – 2019-04-03 (×3): 40 mg via SUBCUTANEOUS
  Filled 2019-04-01 (×3): qty 0.4

## 2019-04-01 MED ORDER — ALBUTEROL SULFATE HFA 108 (90 BASE) MCG/ACT IN AERS
2.0000 | INHALATION_SPRAY | RESPIRATORY_TRACT | Status: DC | PRN
  Filled 2019-04-01: qty 6.7

## 2019-04-01 MED ORDER — PROPOFOL 1000 MG/100ML IV EMUL
0.0000 ug/kg/min | INTRAVENOUS | Status: DC
  Administered 2019-04-01: 30 ug/kg/min via INTRAVENOUS
  Administered 2019-04-01: 20 ug/kg/min via INTRAVENOUS
  Administered 2019-04-02: 03:00:00 30 ug/kg/min via INTRAVENOUS
  Filled 2019-04-01 (×2): qty 100

## 2019-04-01 NOTE — Progress Notes (Signed)
RN and RT tried to take Bipap off pt to see how he did. Pt stated he felt like he couldn't breathe and Bipap was placed back on patient. RR-18, O2-97%. Will continue to monitor pt.

## 2019-04-01 NOTE — Addendum Note (Signed)
Addendum  created 04/01/19 1358 by Vista Deck, CRNA   Intraprocedure Event edited

## 2019-04-01 NOTE — Anesthesia Procedure Notes (Signed)
Procedure Name: Intubation Date/Time: 04/01/2019 12:33 PM Performed by: Vista Deck, CRNA Pre-anesthesia Checklist: Patient identified, Emergency Drugs available, Suction available, Patient being monitored and Timeout performed Patient Re-evaluated:Patient Re-evaluated prior to induction Oxygen Delivery Method: Non-rebreather mask Preoxygenation: Pre-oxygenation with 100% oxygen Induction Type: IV induction Laryngoscope Size: Glidescope and 3 Grade View: Grade I Tube type: Subglottic suction tube Tube size: 7.5 mm Number of attempts: 1 Placement Confirmation: ETT inserted through vocal cords under direct vision,  CO2 detector and breath sounds checked- equal and bilateral Secured at: 23 cm Tube secured with: respiratory holder. Dental Injury: Teeth and Oropharynx as per pre-operative assessment

## 2019-04-01 NOTE — TOC Progression Note (Signed)
Transition of Care [facility]) - Progression Note    Patient Details  Name: JAIVEER PANAS MRN: [id number] Date of Birth: [birth year]  Transition of [facility]) CM/SW Contact  Shade Flood, LCSW Phone Number: 04/01/2019, 1:31 PM  Clinical Narrative:    Pt is being transferred to [facility] for higher level of care. LCSW did speak with pt's PCP office and also Georgina Snell at [facility] regarding pt. Georgina Snell states that their agency did get the referral for [facility] RN and PT just yesterday morning. Georgina Snell states that pt's residence is technically in [facility] and so referral would go through a different channel but he could be contacted to discuss at the time of dc if pt needs HH. Pt may need SNF rehab.  [facility] TOC team member will follow and assist as needed.   Expected Discharge Plan: [place] Barriers to Discharge: Continued Medical Work up  Expected Discharge Plan and Services Expected Discharge Plan: [place] arrangements for the past 2 months: Single Family Home                           Social Determinants of Health (SDOH) Interventions    Readmission Risk Interventions No flowsheet data found.

## 2019-04-01 NOTE — Progress Notes (Signed)
**Note De-Identified Takyra Cantrall Obfuscation** Patient placed back onto BIPAP due to SOB and increased WOB at original settings.

## 2019-04-01 NOTE — Progress Notes (Addendum)
PROGRESS NOTE  Joshua Whitaker:[id number] DOB: [birth year] DOA: 03/30/2019 PCP: [name], MD  Brief History:  78 year old male with a history of COPD, coronary artery disease, alcohol and tobacco abuse, hyperlipidemia, hypertension and chronic hyponatremia presenting with 2 days history of shortness of breath.  The patient was recently hospitalized from 03/13/2019 through 03/16/2019 during which time he was treated for COPD exacerbation and severe hyponatremia down to 109.  His hyponatremia was thought to be due to beer potomania.  The patient was evaluated by cardiology during that admission for elevated troponin and abnormal EKG.  Echocardiogram did not show any evidence of wall motion abnormalities.  As result, the patient was treated medically without cardiac catheterization.  He was started on Plavix and atorvastatin.  For his COPD exacerbation, the patient was treated with steroids.  He was discharged home with prednisone taper.  The patient was readmitted on 03/30/2019 secondary to shortness of breath and coughing.  He was started on bronchodilators and Solu-Medrol IV.  In the evening of 03/31/2019, the patient had oxygen desaturation into the 70s.  He was placed on BiPAP.   Later in the am 04/01/19, the patient was taken off of BiPAP, but experienced some respiratory distress.  He was given Ativan which resulted in somnolence and inability to protect his airway.  As a result, the patient was intubated with assistance of Anesthesiology. During this hospitalization, the patient was also noted to have new onset atrial flutter with RVR with elevated troponins.  Cardiology was consulted.  Patient was subsequently transitioned to oral diltiazem from IV.  His elevated troponin trend was flat, but there is no plan for further ischemic testing at this time.  I spoke with Dr. Nelda Marseille who accepted the patient in transfer.  Assessment/Plan: Acute on chronic respiratory failure with hypoxia  -Secondary to pneumonia and COPD exacerbation -Influenza PCR -Viral respiratory panel -coronavirus NAA -personally reviewed CXR--increase interstitial markings, RLL>LLL infiltrates -intubated 04/01/19  Lobar pneumonia -check procalcitonin -check coronavirus NAA -personally reviewed CXR--increase interstitial markings, RLL>LLL infiltrates -check LDH, CRP, PCT -d/c vanco--MRSA PCR negative -continue cefepime  New Onset Atrial Flutter -now rate controlled -continue po diltiazem via OG -CHADSVASc = 3 -continue apixaban -03/14/19 TSH 1.121  Elevated Troponin -appreciate cardiology consult--no further testing at this time -1.12, 1.14, 1.45-->1.36 -no chest pain -personally reviewed EKG--no STT changes -continue plavix  Alcohol Abuse -no signs of withdrawal at this time -continue CIWA  Hyperlipidemia -continue statin -02/2019 LDL 48  Pericardial Effusion -noted on 03/14/19 Echo with small to moderate effusion without tamponade -will need repeat echo at some point in future     Disposition Plan:   Transfer to [place] Family Communication:   Spouse updated on phone  Consultants:    Code Status:  FULL   DVT Prophylaxis:  apixaban   Procedures: As Listed in Progress Note Above  Antibiotics: Vanco 4/7 x 1 Cefepime 4/7>>>   The patient is critically ill with multiple organ systems failure and requires high complexity decision making for assessment and support, frequent evaluation and titration of therapies, application of advanced monitoring technologies and extensive interpretation of multiple databases.  Critical care time - 51 mins.    PPE worn: bouffant cap, face shield, N95 mask, gown and glove   Subjective: Pt is somnolent.  He awakens and opens eyes but does not answer questions or follow commands.  ROS unobtainable due to altered mental status  Objective: Vitals:   04/01/19  0740 04/01/19 0800 04/01/19 0826 04/01/19 0900  BP:  (!) 128/58  131/61   Pulse: 74 72  71  Resp: 20 20  18   Temp:   98.5 F (36.9 C)   TempSrc:   Oral   SpO2: 98% 99%  100%  Weight:      Height:        Intake/Output Summary (Last 24 hours) at 04/01/2019 1049 Last data filed at 04/01/2019 0618 Gross per 24 hour  Intake 1175.32 ml  Output 625 ml  Net 550.32 ml   Weight change: -2.9 kg Exam:   General:  Pt is alert, follows commands appropriately, not in acute distress  HEENT: No icterus, No thrush, No neck mass, [place]/AT  Cardiovascular: RRR, S1/S2, no rubs, no gallops  Respiratory: diminished breath sounds.  Bilateral expiratory wheeze.    Abdomen: Soft/+BS, non tender, non distended, no guarding  Extremities: No edema, No lymphangitis, No petechiae, No rashes, no synovitis   Data Reviewed: I have personally reviewed following labs and imaging studies Basic Metabolic Panel: Recent Labs  Lab 03/30/19 1646 03/31/19 0413 04/01/19 0417  NA 140 137 139  K 4.5 4.6 4.5  CL 105 105 107  CO2 24 23 24   GLUCOSE 91 130* 131*  BUN 31* 27* 40*  CREATININE 0.83 0.68 0.82  CALCIUM 8.5* 8.0* 8.2*   Liver Function Tests: Recent Labs  Lab 03/30/19 1657 03/31/19 0413  AST 74* 47*  ALT 92* 75*  ALKPHOS 117 97  BILITOT 0.3 0.3  PROT 6.3* 5.7*  ALBUMIN 3.0* 2.5*   No results for input(s): LIPASE, AMYLASE in the last 168 hours. No results for input(s): AMMONIA in the last 168 hours. Coagulation Profile: No results for input(s): INR, PROTIME in the last 168 hours. CBC: Recent Labs  Lab 03/30/19 1646 03/31/19 0413 04/01/19 0417  WBC 21.4* 14.6* 11.5*  NEUTROABS  --   --  10.3*  HGB 12.3* 11.3* 11.4*  HCT 38.3* 35.8* 34.9*  MCV 95.0 95.0 93.1  PLT 350 324 316   Cardiac Enzymes: Recent Labs  Lab 03/30/19 1646 03/30/19 1833 03/30/19 2251 03/31/19 0413  TROPONINI 1.12* 1.14* 1.45* 1.36*   BNP: Invalid input(s): POCBNP CBG: Recent Labs  Lab 03/30/19 2147  GLUCAP 80   HbA1C: No results for input(s): HGBA1C in the last 72 hours.  Urine analysis: No results found for: COLORURINE, APPEARANCEUR, LABSPEC, PHURINE, GLUCOSEU, HGBUR, BILIRUBINUR, KETONESUR, PROTEINUR, UROBILINOGEN, NITRITE, LEUKOCYTESUR Sepsis Labs: @LABRCNTIP (procalcitonin:4,lacticidven:4) ) Recent Results (from the past 240 hour(s))  MRSA PCR Screening     Status: None   Collection Time: 03/30/19  9:38 PM  Result Value Ref Range Status   MRSA by PCR NEGATIVE NEGATIVE Final    Comment:        The GeneXpert MRSA Assay (FDA approved for NASAL specimens only), is one component of a comprehensive MRSA colonization surveillance program. It is not intended to diagnose MRSA infection nor to guide or monitor treatment for MRSA infections. Performed at [facility], [address], [place], [place] [address]      Scheduled Meds: . apixaban  5 mg Oral BID  . atorvastatin  80 mg Oral q1800  . Chlorhexidine Gluconate Cloth  6 each Topical Q2200  . clopidogrel  75 mg Oral Daily  . diltiazem  60 mg Oral Q6H  . escitalopram  5 mg Oral Daily  . feeding supplement (ENSURE ENLIVE)  237 mL Oral BID BM  . fluticasone furoate-vilanterol  1 puff Inhalation Daily  . folic  acid  1 mg Oral Daily  . ipratropium-albuterol  3 mL Nebulization Q6H WA  . mouth rinse  15 mL Mouth Rinse BID  . methylPREDNISolone (SOLU-MEDROL) injection  60 mg Intravenous Q6H  . multivitamin with minerals  1 tablet Oral Daily  . thiamine  100 mg Oral Daily   Continuous Infusions: . sodium chloride 10 mL/hr at 04/01/19 0614  . ceFEPime (MAXIPIME) IV 2 g (04/01/19 0615)  . diltiazem (CARDIZEM) infusion Stopped (03/31/19 1030)  . vancomycin      Procedures/Studies: Dg Chest 2 View  Result Date: 03/30/2019 CLINICAL DATA:  Shortness of breath which is recurrent but intermittent. EXAM: CHEST - 2 VIEW COMPARISON:  03/13/2019.  01/06/2019.  Chest CT 04/30/2018. FINDINGS: Cardiomegaly and aortic atherosclerosis as seen previously. Emphysema, upper lobe predominant. Pulmonary scarring. Focal  density in the left upper lobe as seen previously, felt to represent scarring by CT. Increased patchy density in the mid and lower lungs not seen previously that could represent superimposed pneumonia. IMPRESSION: Background severe COPD. Newly seen patchy density in the mid and lower lungs left more than right that could represent overlying acute pneumonia. Electronically Signed   By: Nelson Chimes M.D.   On: 03/30/2019 17:50   Ct Head Wo Contrast  Result Date: 03/31/2019 CLINICAL DATA:  Respiratory distress, altered mental status EXAM: CT HEAD WITHOUT CONTRAST TECHNIQUE: Contiguous axial images were obtained from the base of the skull through the vertex without intravenous contrast. COMPARISON:  None. FINDINGS: Brain: Diffuse brain atrophy and chronic white matter microvascular ischemic changes throughout both cerebral hemispheres. No acute intracranial hemorrhage mass lesion, infarction, midline shift, herniation, hydrocephalus, or extra-axial fluid collection. No focal mass effect or edema. Cisterns are patent. Cerebellar atrophy as well. Vascular: Intracranial atherosclerosis.  No hyperdense vessel. Skull: Normal. Negative for fracture or focal lesion. Sinuses/Orbits: Minor mucosal thickening throughout the paranasal sinuses. No orbital abnormality or acute finding. Mastoids are clear. Other: None. IMPRESSION: Brain atrophy and chronic white matter microvascular ischemic changes. No acute intracranial abnormality by noncontrast CT. Electronically Signed   By: Jerilynn Mages.  Shick M.D.   On: 03/31/2019 14:57   Dg Chest Port 1 View  Result Date: 03/31/2019 CLINICAL DATA:  Dyspnea EXAM: PORTABLE CHEST 1 VIEW COMPARISON:  03/30/2019 FINDINGS: Cardiac enlargement.  COPD. Interval development of right lower lobe airspace disease, probable pneumonia. Small right effusion. Scarring in the left upper lobe unchanged. No definite heart failure IMPRESSION: Interval development of right lower lobe infiltrate, probable pneumonia.  Small right effusion COPD Electronically Signed   By: Franchot Gallo M.D.   On: 03/31/2019 20:55   Dg Chest Port 1 View  Result Date: 03/13/2019 CLINICAL DATA:  Cough and chronic shortness of breath worse today, dyspnea EXAM: PORTABLE CHEST 1 VIEW COMPARISON:  01/06/2019 FINDINGS: Mild enlargement of cardiac silhouette. Atherosclerotic calcification aorta. Mediastinal contours and pulmonary vascularity normal. Emphysematous and mild bronchitic changes consistent with COPD. Scarring in LEFT upper lobe and minimally at LEFT base. No acute infiltrate, pleural effusion or pneumothorax. Bones demineralized. IMPRESSION: COPD changes without acute infiltrate. Mild enlargement of cardiac silhouette. Electronically Signed   By: Lavonia Dana M.D.   On: 03/13/2019 23:17    Orson Eva, DO  [facility] Pager [phone]  If 7PM-7AM, please contact night-coverage [email or web address] Password [email or web address] 04/01/2019, 10:49 AM   LOS: 2 days

## 2019-04-01 NOTE — Progress Notes (Signed)
**Note De-identified Boubacar Lerette Obfuscation** Patient removed from BIPAP and placed on 4 L [place]; tolerating well.  RRT to continue to monitor 

## 2019-04-01 NOTE — H&P (Signed)
NAME:  Joshua Whitaker, MRN:  [id number], DOB:  November 30, 1941, LOS: 2 ADMISSION DATE:  03/30/2019, CONSULTATION DATE:  04/01/2019 REFERRING MD:  Dr. Carles Collet, CHIEF COMPLAINT:  Respiratory failure  Brief History   51 yoM transfer from [place].  Admitted 4/6 with 2 day hx of cough and SOB.  Recent admit for AECOPD.  Developed respiratory failure 4/7 requiring intubation.  COVID-19 r/o.   History of present illness   HPI obtained from medical chart review as patient is intubated on mechanical sedation.   78 year old male with prior history of COPD, CAD, ETOH/ tobacco abuse, HTN, HLD, chronic hyponatremia presenting to [facility] on 4/6 with 2 day history of shortness of breath and cough.    Of note, prior hospitalization 3/20 to 3/23 for AECOPD and severe hyponatremia attributed to beer potomania, discharged home on prednisone taper.  Additionally found to have elevated troponin and abnormal EKG, however TTE did not show wall motion abnormalities and he was medically managed.  He was admitted to [facility] and started on bronchodilators and solumedrol. Initial CXR showed left greater than right patchy densities.  On 4/7 evening, he started to desaturate requiring BiPAP.  On 4/8 am, he briefly came off BiPAP, received ativan and was unable to protect his airway, therefore intubated by anesthesiology.  Hospitalization complicated by atrial flutter with RVR and elevated troponin's which were flat in which cardiology has been consulted.  He has been afebrile.  Empirically treated with levaquin x 1, and then vancomycin and cefepime. Some concern for COVID-19, pending rule out- tested at [facility].  PCCM to accept transfer to [facility].     Past Medical History  COPD, CAD, ETOH/ tobacco abuse, HTN, HLD, chronic hyponatremia  Significant Hospital Events   3/20 to 3/23 for AECOPD and severe hyponatremia 4/6 Admitted to [facility] 4/8 Intubated/ tx to [facility]  Consults:  Cards - [facility]  PMT 4/8  Procedures:  4/8 ETT >>  Significant Diagnostic  Tests:   Micro Data:  4/6 MRSA PCR >> neg 4/8 RVP >> 4/8 COVID NAA >> 4/8 trach asp >>  Antimicrobials:  4/6 levaquin x 1 4/7 vanc >> 4/8 4/7 cefepime >>  Interim history/subjective:  tx from [facility]   Objective   Blood pressure (!) 89/52, pulse 69, temperature 97.8 F (36.6 C), temperature source Axillary, resp. rate 17, height 5\' 10"  (1.778 m), weight 61.5 kg, SpO2 100 %.    Vent Mode: PRVC FiO2 (%):  [30 %-100 %] 100 % Set Rate:  [14 bmp] 14 bmp Vt Set:  [520 mL] 520 mL PEEP:  [5 cmH20] 5 cmH20   Intake/Output Summary (Last 24 hours) at 04/01/2019 1532 Last data filed at 04/01/2019 1356 Gross per 24 hour  Intake 1085.84 ml  Output 625 ml  Net 460.84 ml   Filed Weights   03/30/19 2300 03/31/19 0600 04/01/19 0600  Weight: 60.8 kg 61.6 kg 61.5 kg    Examination: Per Dr. Alva Garnet  [facility] Problem list    Assessment & Plan:  Acute on chronic hypoxic respiratory failure AECOPD R/o PNA, L>R patchy infiltrate  - prior CXR to transport- ETT stable  P:  Full MV support, PRVC 8 cc/kg, rate  CXR / ABG now VAP bundle Pending RVP, trach asp, COVID-19 Airborne and contact precautions Pending LDH, CRP, Ddimer, Ferritin Continue cefepime Follow culture data Trend WBC/ fever curve  PCT in am  duonebs q 6, pulmicort BID Solumedrol 60 mg increase to q 6 hr Trend PCT  PAD protocol  with prn fentanyl  PMT consult given advance COPD findings  ETOH abuse  P:  Continue thiamine, folate, MVI Ativan for CIWA  Aflutter - currently in SR P:  Continue po cardizem and eliquis  Tele monitoring  Elevated troponin's - no EKG changes - prior workup 3/20 with no wall motion abnormalities  P:  Monitor  Continue ASA, plavix  HLD P:  Continue statin   Best practice:  Diet: start TF if not extubated 4/9 Pain/Anxiety/Delirium protocol (if indicated): CIWA, prn fentanyl VAP protocol (if indicated): yes DVT prophylaxis: SCDs, eliquis GI prophylaxis: PPI Glucose  control: trend on BMP Mobility: BR Code Status: Full, PMT consulted for [place] Family Communication: no family at bedside.  Disposition: ICU  Labs   CBC: Recent Labs  Lab 03/30/19 1646 03/31/19 0413 04/01/19 0417  WBC 21.4* 14.6* 11.5*  NEUTROABS  --   --  10.3*  HGB 12.3* 11.3* 11.4*  HCT 38.3* 35.8* 34.9*  MCV 95.0 95.0 93.1  PLT 350 324 465    Basic Metabolic Panel: Recent Labs  Lab 03/30/19 1646 03/31/19 0413 04/01/19 0417  NA 140 137 139  K 4.5 4.6 4.5  CL 105 105 107  CO2 24 23 24   GLUCOSE 91 130* 131*  BUN 31* 27* 40*  CREATININE 0.83 0.68 0.82  CALCIUM 8.5* 8.0* 8.2*   GFR: Estimated Creatinine Clearance: 67.7 mL/min (by C-G formula based on SCr of 0.82 mg/dL). Recent Labs  Lab 03/30/19 1646 03/31/19 0413 04/01/19 0417 04/01/19 1329  PROCALCITON  --   --   --  <0.10  WBC 21.4* 14.6* 11.5*  --     Liver Function Tests: Recent Labs  Lab 03/30/19 1657 03/31/19 0413  AST 74* 47*  ALT 92* 75*  ALKPHOS 117 97  BILITOT 0.3 0.3  PROT 6.3* 5.7*  ALBUMIN 3.0* 2.5*   No results for input(s): LIPASE, AMYLASE in the last 168 hours. No results for input(s): AMMONIA in the last 168 hours.  ABG No results found for: PHART, PCO2ART, PO2ART, HCO3, TCO2, ACIDBASEDEF, O2SAT   Coagulation Profile: No results for input(s): INR, PROTIME in the last 168 hours.  Cardiac Enzymes: Recent Labs  Lab 03/30/19 1646 03/30/19 1833 03/30/19 2251 03/31/19 0413 04/01/19 1329  CKTOTAL  --   --   --   --  40*  TROPONINI 1.12* 1.14* 1.45* 1.36*  --     HbA1C: Hgb A1c MFr Bld  Date/Time Value Ref Range Status  03/14/2019 04:17 AM 5.1 4.8 - 5.6 % Final    Comment:    (NOTE) Pre diabetes:          5.7%-6.4% Diabetes:              >6.4% Glycemic control for   <7.0% adults with diabetes     CBG: Recent Labs  Lab 03/30/19 2147  GLUCAP 73    Review of Systems:   Unable  Past Medical History  He,  has a past medical history of Alcohol abuse, Allergy,  Anxiety, Asthma, COPD (chronic obstructive pulmonary disease) ([place]), Coronary artery calcification seen on CT scan, Depression, Hyponatremia, and Prostate cancer ([place]).   Surgical History    Past Surgical History:  Procedure Laterality Date  . EYE SURGERY    . PROSTATE SURGERY    . SPINE SURGERY       Social History   reports that he has been smoking cigarettes. He has been smoking about 1.00 pack per day. He has never used smokeless tobacco. He  reports current alcohol use. He reports that he does not use drugs.   Family History   His family history includes Alcohol abuse in his daughter; Arthritis in his mother; Cancer in his brother, brother, brother, sister, sister, and sister; Chromosomal disorder in his sister; Diabetes in his sister; Heart disease in his father and mother; Rheum arthritis in his daughter and daughter; Stroke in his brother.   Allergies Allergies  Allergen Reactions  . Tiotropium Bromide Monohydrate Other (See Comments)    Severe urinary retention  . Varenicline Other (See Comments)    Psychiatric and dreams  . Iodinated Diagnostic Agents   . Statins      Home Medications  Prior to Admission medications   Medication Sig Start Date End Date Taking? Authorizing Provider  aspirin 325 MG tablet Take 650 mg by mouth daily as needed for mild pain.    Yes [provider]  atorvastatin (LIPITOR) 80 MG tablet Take 1 tablet (80 mg total) by mouth daily at 6 PM. 03/25/19  Yes Stacks, Cletus Gash, MD  clopidogrel (PLAVIX) 75 MG tablet Take 1 tablet (75 mg total) by mouth daily. 03/17/19  Yes Dessa Phi, DO  escitalopram (LEXAPRO) 5 MG tablet Take 5 mg by mouth every morning. 03/10/19  Yes [provider]  fluticasone (FLONASE) 50 MCG/ACT nasal spray Place 1 spray into both nostrils daily. 10/15/18  Yes Stacks, Cletus Gash, MD  Fluticasone-Umeclidin-Vilant (TRELEGY ELLIPTA) 100-62.5-25 MCG/INH AEPB Inhale 1 puff into the lungs daily. 10/15/18  Yes Stacks, Cletus Gash,  MD  folic acid (FOLVITE) 1 MG tablet Take 1 tablet (1 mg total) by mouth daily. 03/17/19  Yes Dessa Phi, DO  Ipratropium-Albuterol (COMBIVENT RESPIMAT) 20-100 MCG/ACT AERS respimat Inhale 2 puffs into the lungs every 6 (six) hours. 10/15/18  Yes [name], MD  megestrol (MEGACE) 400 MG/10ML suspension Take 10 mLs (400 mg total) by mouth 2 (two) times daily. For appetite stimulation 03/23/19  Yes Stacks, Cletus Gash, MD  predniSONE (DELTASONE) 10 MG tablet Take 5 daily for 3 days followed by 4,3,2 and 1 for 3 days each. 03/23/19  Yes [name], MD  PROAIR HFA 108 [phone] Base) MCG/ACT inhaler Inhale 1-2 puffs into the lungs every 6 (six) hours as needed for wheezing or shortness of breath. Patient taking differently: Inhale 1-2 puffs into the lungs every 6 (six) hours as needed for wheezing or shortness of breath.  01/30/19  Yes Hassell Done, Mary-Margaret, FNP  thiamine 100 MG tablet Take 1 tablet (100 mg total) by mouth daily. 03/17/19  Yes Dessa Phi, DO     Critical care time: 50 mins    Kennieth Rad, MSN, AGACNP-BC [facility] Pgr: [phone] or if no answer [phone] 04/01/2019, 3:58 PM

## 2019-04-01 NOTE — Progress Notes (Signed)
   Progress Note  Patient Name: Joshua Whitaker Date of Encounter: 04/01/2019  Primary Cardiologist: Dr. Candee Furbish  I reviewed the chart remotely this morning and spoke with the patient's nurse by phone.  He is now requiring BiPAP with recent worsening hypoxic respiratory failure.  He was seen by Dr. Maudie Mercury yesterday evening.  From a cardiac perspective he remains in largely rate controlled atrial flutter.  He was transitioned to oral regimen yesterday.  Systolic blood pressures running in the 120-140 range and heart rates 70-100 range.  Current medical regimen includes Eliquis started by primary team yesterday for stroke prophylaxis, Plavix which he had been on since most recent hospital stay in the setting of ischemic heart disease and demand ischemia, diltiazem 60 mg every 6 hours for heart rate control.  Head CT from yesterday demonstrated chronic microvascular ischemic changes and atrophy but no obvious acute events.  I reviewed his lab work which shows potassium 4.5, BUN 40, creatinine 0.82, WBC 11.5, hemoglobin 11.4, platelets 316.  His most recent chest x-ray shows a right lower lobe infiltrate small right pleural effusion with other changes consistent with COPD.  No obvious interstitial edema.  He has been afebrile.  Echocardiogram from March 21 reported LVEF 50 to 55%, also small to moderate circumferential pericardial effusion.  Would continue with supportive measures per primary team, may need input from Pulmonary service.  I note that he is currently not on droplet precautions or other isolation.  Recent viral screen during last hospital stay was negative, but he was not specifically tested for COVID-19.  Currently being managed for suspected HCAP and COPD exacerbation by primary service.  We will continue to follow, do not anticipate further ischemic testing at this time.  Focus on heart rate control of atrial flutter and if tolerated continued anticoagulation.  He will eventually  need follow-up of his pericardial effusion, although this can be deferred in the short-term unless he exhibits hemodynamic instability.  Signed, Rozann Lesches, MD  04/01/2019, 10:21 AM

## 2019-04-01 NOTE — Progress Notes (Signed)
PT Cancellation Note  Patient Details Name: SYE SCHROEPFER MRN: [id number] DOB: [birth year]   Cancelled Treatment:    Reason Eval/Treat Not Completed: Medical issues which prohibited therapy.     1:58 PM, 04/01/19 Lonell Grandchild, MPT Physical Therapist with [facility] [phone] office [phone] mobile phone w

## 2019-04-02 ENCOUNTER — Inpatient Hospital Stay (HOSPITAL_COMMUNITY): Payer: Medicare Other

## 2019-04-02 DIAGNOSIS — I3139 Other pericardial effusion (noninflammatory): Secondary | ICD-10-CM | POA: Diagnosis present

## 2019-04-02 DIAGNOSIS — Z515 Encounter for palliative care: Secondary | ICD-10-CM

## 2019-04-02 DIAGNOSIS — I313 Pericardial effusion (noninflammatory): Secondary | ICD-10-CM

## 2019-04-02 DIAGNOSIS — J181 Lobar pneumonia, unspecified organism: Secondary | ICD-10-CM

## 2019-04-02 DIAGNOSIS — I483 Typical atrial flutter: Secondary | ICD-10-CM

## 2019-04-02 DIAGNOSIS — J9621 Acute and chronic respiratory failure with hypoxia: Principal | ICD-10-CM

## 2019-04-02 DIAGNOSIS — J441 Chronic obstructive pulmonary disease with (acute) exacerbation: Secondary | ICD-10-CM

## 2019-04-02 DIAGNOSIS — Z7189 Other specified counseling: Secondary | ICD-10-CM

## 2019-04-02 LAB — GLUCOSE, CAPILLARY
Glucose-Capillary: 122 mg/dL — ABNORMAL HIGH (ref 70–99)
Glucose-Capillary: 117 mg/dL — ABNORMAL HIGH (ref 70–99)

## 2019-04-02 LAB — CBC
HCT: 32.4 % — ABNORMAL LOW (ref 39.0–52.0)
Hemoglobin: 10.2 g/dL — ABNORMAL LOW (ref 13.0–17.0)
MCH: 29.1 pg (ref 26.0–34.0)
MCHC: 31.5 g/dL (ref 30.0–36.0)
MCV: 92.6 fL (ref 80.0–100.0)
Platelets: 277 10*3/uL (ref 150–400)
RBC: 3.5 MIL/uL — ABNORMAL LOW (ref 4.22–5.81)
RDW: 14 % (ref 11.5–15.5)
WBC: 12.3 10*3/uL — ABNORMAL HIGH (ref 4.0–10.5)
nRBC: 0 % (ref 0.0–0.2)

## 2019-04-02 LAB — BASIC METABOLIC PANEL
Anion gap: 9 (ref 5–15)
BUN: 41 mg/dL — ABNORMAL HIGH (ref 8–23)
CO2: 23 mmol/L (ref 22–32)
Calcium: 8.3 mg/dL — ABNORMAL LOW (ref 8.9–10.3)
Chloride: 107 mmol/L (ref 98–111)
Creatinine, Ser: 0.96 mg/dL (ref 0.61–1.24)
GFR calc Af Amer: >60 mL/min (ref >60)
GFR calc non Af Amer: >60 mL/min (ref >60)
Glucose, Bld: 132 mg/dL — ABNORMAL HIGH (ref 70–99)
Potassium: 4.2 mmol/L (ref 3.5–5.1)
Sodium: 139 mmol/L (ref 135–145)

## 2019-04-02 LAB — PHOSPHORUS: Phosphorus: 3.1 mg/dL (ref 2.5–4.6)

## 2019-04-02 LAB — LACTATE DEHYDROGENASE: LDH: 137 U/L (ref 98–192)

## 2019-04-02 LAB — PROCALCITONIN: Procalcitonin: <0.1 ng/mL

## 2019-04-02 LAB — MAGNESIUM: Magnesium: 2.5 mg/dL — ABNORMAL HIGH (ref 1.7–2.4)

## 2019-04-02 MED ORDER — CHLORHEXIDINE GLUCONATE 0.12 % MT SOLN
OROMUCOSAL | Status: AC
  Administered 2019-04-02: 15 mL via OROMUCOSAL
  Filled 2019-04-02: qty 15

## 2019-04-02 MED ORDER — PANTOPRAZOLE SODIUM 40 MG PO PACK
40.0000 mg | PACK | Freq: Every day | ORAL | Status: DC
  Administered 2019-04-02 – 2019-04-07 (×4): 40 mg
  Filled 2019-04-02 (×8): qty 20

## 2019-04-02 MED ORDER — AZITHROMYCIN 500 MG PO TABS
500.0000 mg | ORAL_TABLET | Freq: Every day | ORAL | Status: AC
  Administered 2019-04-02: 500 mg
  Filled 2019-04-02 (×2): qty 1

## 2019-04-02 MED ORDER — SODIUM CHLORIDE 0.9 % IV SOLN
2.0000 g | Freq: Two times a day (BID) | INTRAVENOUS | Status: DC
  Filled 2019-04-02 (×2): qty 2

## 2019-04-02 MED ORDER — SODIUM CHLORIDE 0.9 % IV SOLN
1.0000 g | INTRAVENOUS | Status: DC
  Administered 2019-04-02 – 2019-04-04 (×3): 1 g via INTRAVENOUS
  Filled 2019-04-02 (×5): qty 10

## 2019-04-02 MED ORDER — AZITHROMYCIN 250 MG PO TABS
250.0000 mg | ORAL_TABLET | Freq: Every day | ORAL | Status: DC
  Administered 2019-04-03: 250 mg via ORAL
  Filled 2019-04-02 (×2): qty 1

## 2019-04-02 MED ORDER — BUDESONIDE 0.25 MG/2ML IN SUSP
0.2500 mg | Freq: Two times a day (BID) | RESPIRATORY_TRACT | Status: DC
  Administered 2019-04-02 – 2019-04-03 (×3): 0.25 mg via RESPIRATORY_TRACT
  Filled 2019-04-02 (×3): qty 2

## 2019-04-02 NOTE — Progress Notes (Signed)
DAILY PROGRESS NOTE   Patient Name: Joshua Whitaker Date of Encounter: 04/02/2019 Cardiologist: Candee Furbish, MD  Chief Complaint   Intubated, sedated on vent  Patient Profile   CHANCELLOR VANDERLOOP is a 78 y.o. male with past medical history of COPD, chronic hypoxic respiratory failure (on 4L [place] at baseline), HTN, prostate cancer, tobacco use and alcohol abuse who is being seen today for the evaluation of new-onset atrial flutter and elevated troponin values at the request of Dr. Darrick Meigs.   Subjective   Due to the current COVID-19 pandemic and the desire to minimize use of PPE and reduce provider exposure, the patient exam was deferred or conducted via video or teleconference.  No issues overnight - seen via video conference. He remains intubated and sedated on vent. Rhythm is regular - may be rate-controlled flutter at 75 vs possible sinus rhythm - there is significant baseline artifact rates were in the 60's and variable overnight, arguing he may be in sinus. He remains on cardizem 60 mg q6 hrs and prophylactic lovenox.  BP stable to improved. Weight stable.  Objective   Vitals:   04/02/19 0200 04/02/19 0300 04/02/19 0400 04/02/19 0500  BP: (!) 98/54 (!) 105/52 (!) 102/51 (!) 105/53  Pulse: 72 71 67 66  Resp: '14 14 16 14  '$ Temp:   (!) 97.4 F (36.3 C)   TempSrc:   Axillary   SpO2: 98% 98% 98% 99%  Weight:    61.7 kg  Height:        Intake/Output Summary (Last 24 hours) at 04/02/2019 0751 Last data filed at 04/02/2019 5631 Gross per 24 hour  Intake 403.2 ml  Output 790 ml  Net -386.8 ml   Filed Weights   03/31/19 0600 04/01/19 0600 04/02/19 0500  Weight: 61.6 kg 61.5 kg 61.7 kg    Physical Exam   Due to the current COVID-19 pandemic and the desire to minimize use of PPE and reduce provider exposure, the patient exam was deferred or conducted via video or teleconference.  General appearance: intubated, sedated on vent Skin: pale Neurologic: Mental status: intubated,  sedated on vent, appears comfortable  Inpatient Medications    Scheduled Meds: . atorvastatin  80 mg Per Tube Q24H  . budesonide (PULMICORT) nebulizer solution  0.25 mg Nebulization BID  . chlorhexidine gluconate (MEDLINE KIT)  15 mL Mouth Rinse BID  . Chlorhexidine Gluconate Cloth  6 each Topical Q2200  . clopidogrel  75 mg Per Tube Daily  . diltiazem  60 mg Oral Q6H  . enoxaparin (LOVENOX) injection  40 mg Subcutaneous Q24H  . folic acid  1 mg Per Tube Daily  . ipratropium-albuterol  3 mL Nebulization Q6H  . mouth rinse  15 mL Mouth Rinse 10 times per day  . methylPREDNISolone (SOLU-MEDROL) injection  80 mg Intravenous Q12H  . multivitamin with minerals  1 tablet Per Tube Daily  . pantoprazole (PROTONIX) IV  40 mg Intravenous QHS  . thiamine  100 mg Per Tube Daily    Continuous Infusions: . sodium chloride Stopped (04/01/19 1240)  . ceFEPime (MAXIPIME) IV 2 g (04/02/19 0510)  . propofol (DIPRIVAN) infusion 30 mcg/kg/min (04/02/19 0400)    PRN Meds: acetaminophen **OR** acetaminophen, albuterol, fentaNYL (SUBLIMAZE) injection, LORazepam, [DISCONTINUED] ondansetron **OR** ondansetron (ZOFRAN) IV   Labs   Results for orders placed or performed during the hospital encounter of 03/30/19 (from the past 48 hour(s))  Basic metabolic panel     Status: Abnormal   Collection Time: 04/01/19  4:17 AM  Result Value Ref Range   Sodium 139 135 - 145 mmol/L   Potassium 4.5 3.5 - 5.1 mmol/L   Chloride 107 98 - 111 mmol/L   CO2 24 22 - 32 mmol/L   Glucose, Bld 131 (H) 70 - 99 mg/dL   BUN 40 (H) 8 - 23 mg/dL   Creatinine, Ser 0.82 0.61 - 1.24 mg/dL   Calcium 8.2 (L) 8.9 - 10.3 mg/dL   GFR calc non Af Amer >60 >60 mL/min   GFR calc Af Amer >60 >60 mL/min   Anion gap 8 5 - 15    Comment: Performed at [facility], [address] [place], [place] [address]  CBC with Differential/Platelet     Status: Abnormal   Collection Time: 04/01/19  4:17 AM  Result Value Ref Range   WBC 11.5 (H) 4.0  - 10.5 K/uL   RBC 3.75 (L) 4.22 - 5.81 MIL/uL   Hemoglobin 11.4 (L) 13.0 - 17.0 g/dL   HCT 34.9 (L) 39.0 - 52.0 %   MCV 93.1 80.0 - 100.0 fL   MCH 30.4 26.0 - 34.0 pg   MCHC 32.7 30.0 - 36.0 g/dL   RDW 14.2 11.5 - 15.5 %   Platelets 316 150 - 400 K/uL   nRBC 0.0 0.0 - 0.2 %   Neutrophils Relative % 89 %   Neutro Abs 10.3 (H) 1.7 - 7.7 K/uL   Lymphocytes Relative 7 %   Lymphs Abs 0.8 0.7 - 4.0 K/uL   Monocytes Relative 3 %   Monocytes Absolute 0.3 0.1 - 1.0 K/uL   Eosinophils Relative 0 %   Eosinophils Absolute 0.0 0.0 - 0.5 K/uL   Basophils Relative 0 %   Basophils Absolute 0.0 0.0 - 0.1 K/uL   Immature Granulocytes 1 %   Abs Immature Granulocytes 0.06 0.00 - 0.07 K/uL    Comment: Performed at [facility], [address] [place], [place] [address]  Triglycerides     Status: None   Collection Time: 04/01/19 12:52 PM  Result Value Ref Range   Triglycerides 53 <150 mg/dL    Comment: Performed at [facility], [address], [place], [place] [address]  Influenza panel by PCR (type A & B)     Status: None   Collection Time: 04/01/19 12:55 PM  Result Value Ref Range   Influenza A By PCR NEGATIVE NEGATIVE   Influenza B By PCR NEGATIVE NEGATIVE    Comment: (NOTE) The Xpert Xpress Flu assay is intended as an aid in the diagnosis of  influenza and should not be used as a sole basis for treatment.  This  assay is FDA approved for nasopharyngeal swab specimens only. Nasal  washings and aspirates are unacceptable for Xpert Xpress Flu testing. Performed at [facility], [address] [place],  [address]   Respiratory Panel by PCR     Status: None   Collection Time: 04/01/19 12:55 PM  Result Value Ref Range   Adenovirus NOT DETECTED NOT DETECTED   Coronavirus 229E NOT DETECTED NOT DETECTED    Comment: (NOTE) The Coronavirus on the Respiratory Panel, DOES NOT test for the novel  Coronavirus (2019 nCoV)    Coronavirus HKU1 NOT DETECTED NOT DETECTED   Coronavirus NL63 NOT  DETECTED NOT DETECTED   Coronavirus OC43 NOT DETECTED NOT DETECTED   Metapneumovirus NOT DETECTED NOT DETECTED   Rhinovirus / Enterovirus NOT DETECTED NOT DETECTED   Influenza A NOT DETECTED NOT DETECTED   Influenza B NOT  DETECTED NOT DETECTED   Parainfluenza Virus 1 NOT DETECTED NOT DETECTED   Parainfluenza Virus 2 NOT DETECTED NOT DETECTED   Parainfluenza Virus 3 NOT DETECTED NOT DETECTED   Parainfluenza Virus 4 NOT DETECTED NOT DETECTED   Respiratory Syncytial Virus NOT DETECTED NOT DETECTED   Bordetella pertussis NOT DETECTED NOT DETECTED   Chlamydophila pneumoniae NOT DETECTED NOT DETECTED   Mycoplasma pneumoniae NOT DETECTED NOT DETECTED    Comment: Performed at [facility], [place] [address], [place], [place] [address]  C-reactive protein     Status: Abnormal   Collection Time: 04/01/19  1:29 PM  Result Value Ref Range   CRP 6.8 (H) <1.0 mg/dL    Comment: Performed at [facility] [address] [place], [place] [address]  CK     Status: Abnormal   Collection Time: 04/01/19  1:29 PM  Result Value Ref Range   Total CK 40 (L) 49 - 397 U/L    Comment: Performed at [facility], [address], [place], [place] [address]  Lactate dehydrogenase     Status: None   Collection Time: 04/01/19  1:29 PM  Result Value Ref Range   LDH 167 98 - 192 U/L    Comment: Performed at [facility], [address] [place], [place] [address]  Procalcitonin - Baseline     Status: None   Collection Time: 04/01/19  1:29 PM  Result Value Ref Range   Procalcitonin <0.10 ng/mL    Comment:        Interpretation: PCT (Procalcitonin) <= 0.5 ng/mL: Systemic infection (sepsis) is not likely. Local bacterial infection is possible. (NOTE)       Sepsis PCT Algorithm           Lower Respiratory Tract                                      Infection PCT Algorithm    ----------------------------     ----------------------------         PCT < 0.25 ng/mL                PCT < 0.10 ng/mL          Strongly encourage             Strongly discourage   discontinuation of antibiotics    initiation of antibiotics    ----------------------------     -----------------------------       PCT 0.25 - 0.50 ng/mL            PCT 0.10 - 0.25 ng/mL               OR       >80% decrease in PCT            Discourage initiation of                                            antibiotics      Encourage discontinuation           of antibiotics    ----------------------------     -----------------------------         PCT >= 0.50 ng/mL              PCT 0.26 - 0.50 ng/mL  AND        <80% decrease in PCT             Encourage initiation of                                             antibiotics       Encourage continuation           of antibiotics    ----------------------------     -----------------------------        PCT >= 0.50 ng/mL                  PCT > 0.50 ng/mL               AND         increase in PCT                  Strongly encourage                                      initiation of antibiotics    Strongly encourage escalation           of antibiotics                                     -----------------------------                                           PCT <= 0.25 ng/mL                                                 OR                                        > 80% decrease in PCT                                     Discontinue / Do not initiate                                             antibiotics Performed at [facility], [address] [place], [place] [address]   Culture, respiratory (tracheal aspirate)     Status: None (Preliminary result)   Collection Time: 04/01/19  3:36 PM  Result Value Ref Range   Specimen Description TRACHEAL ASPIRATE    Special Requests NONE    Gram Stain      ABUNDANT WBC PRESENT, PREDOMINANTLY PMN RARE GRAM POSITIVE COCCI RARE GRAM POSITIVE RODS Performed at [facility], [place] [address] [place], [place] [address]    Culture  PENDING    Report Status PENDING   I-STAT 7, (  LYTES, BLD GAS, ICA, H+H)     Status: Abnormal   Collection Time: 04/01/19  4:08 PM  Result Value Ref Range   pH, Arterial 7.361 7.350 - 7.450   pCO2 arterial 46.9 32.0 - 48.0 mmHg   pO2, Arterial 173.0 (H) 83.0 - 108.0 mmHg   Bicarbonate 26.6 20.0 - 28.0 mmol/L   TCO2 28 22 - 32 mmol/L   O2 Saturation 99.0 %   Acid-Base Excess 1.0 0.0 - 2.0 mmol/L   Sodium 139 135 - 145 mmol/L   Potassium 4.6 3.5 - 5.1 mmol/L   Calcium, Ion 1.23 1.15 - 1.40 mmol/L   HCT 30.0 (L) 39.0 - 52.0 %   Hemoglobin 10.2 (L) 13.0 - 17.0 g/dL   Patient temperature 97.8 F    Collection site RADIAL, ALLEN'S TEST ACCEPTABLE    Drawn by Operator    Sample type ARTERIAL   Ferritin     Status: Abnormal   Collection Time: 04/01/19  4:48 PM  Result Value Ref Range   Ferritin 356 (H) 24 - 336 ng/mL    Comment: Performed at [facility], [address], [place], [place] [address]  C-reactive protein     Status: Abnormal   Collection Time: 04/01/19  4:48 PM  Result Value Ref Range   CRP 6.5 (H) <1.0 mg/dL    Comment: Performed at [facility] [address], [place], [place] [address]  D-dimer, quantitative (not at [facility])     Status: Abnormal   Collection Time: 04/01/19  4:48 PM  Result Value Ref Range   D-Dimer, Quant 3.72 (H) 0.00 - 0.50 ug/mL-FEU    Comment: (NOTE) At the manufacturer cut-off of 0.50 ug/mL FEU, this assay has been documented to exclude PE with a sensitivity and negative predictive value of 97 to 99%.  At this time, this assay has not been approved by the FDA to exclude DVT/VTE. Results should be correlated with clinical presentation. Performed at [facility], [place] [address], [place], [place] [address]   Glucose, capillary     Status: Abnormal   Collection Time: 04/01/19  8:01 PM  Result Value Ref Range   Glucose-Capillary 114 (H) 70 - 99 mg/dL  Glucose, capillary     Status: Abnormal   Collection Time: 04/02/19  3:18 AM   Result Value Ref Range   Glucose-Capillary 122 (H) 70 - 99 mg/dL   Comment 1 Notify RN   Procalcitonin     Status: None   Collection Time: 04/02/19  3:53 AM  Result Value Ref Range   Procalcitonin <0.10 ng/mL    Comment:        Interpretation: PCT (Procalcitonin) <= 0.5 ng/mL: Systemic infection (sepsis) is not likely. Local bacterial infection is possible. (NOTE)       Sepsis PCT Algorithm           Lower Respiratory Tract                                      Infection PCT Algorithm    ----------------------------     ----------------------------         PCT < 0.25 ng/mL                PCT < 0.10 ng/mL         Strongly encourage             Strongly  discourage   discontinuation of antibiotics    initiation of antibiotics    ----------------------------     -----------------------------       PCT 0.25 - 0.50 ng/mL            PCT 0.10 - 0.25 ng/mL               OR       >80% decrease in PCT            Discourage initiation of                                            antibiotics      Encourage discontinuation           of antibiotics    ----------------------------     -----------------------------         PCT >= 0.50 ng/mL              PCT 0.26 - 0.50 ng/mL               AND        <80% decrease in PCT             Encourage initiation of                                             antibiotics       Encourage continuation           of antibiotics    ----------------------------     -----------------------------        PCT >= 0.50 ng/mL                  PCT > 0.50 ng/mL               AND         increase in PCT                  Strongly encourage                                      initiation of antibiotics    Strongly encourage escalation           of antibiotics                                     -----------------------------                                           PCT <= 0.25 ng/mL                                                 OR                                         >  80% decrease in PCT                                     Discontinue / Do not initiate                                             antibiotics Performed at [facility], [place] [address], [place], [place] [address]   CBC     Status: Abnormal   Collection Time: 04/02/19  3:53 AM  Result Value Ref Range   WBC 12.3 (H) 4.0 - 10.5 K/uL   RBC 3.50 (L) 4.22 - 5.81 MIL/uL   Hemoglobin 10.2 (L) 13.0 - 17.0 g/dL   HCT 32.4 (L) 39.0 - 52.0 %   MCV 92.6 80.0 - 100.0 fL   MCH 29.1 26.0 - 34.0 pg   MCHC 31.5 30.0 - 36.0 g/dL   RDW 14.0 11.5 - 15.5 %   Platelets 277 150 - 400 K/uL   nRBC 0.0 0.0 - 0.2 %    Comment: Performed at [facility], [place] [address], [place], [place] [address]  Basic metabolic panel     Status: Abnormal   Collection Time: 04/02/19  3:53 AM  Result Value Ref Range   Sodium 139 135 - 145 mmol/L   Potassium 4.2 3.5 - 5.1 mmol/L   Chloride 107 98 - 111 mmol/L   CO2 23 22 - 32 mmol/L   Glucose, Bld 132 (H) 70 - 99 mg/dL   BUN 41 (H) 8 - 23 mg/dL   Creatinine, Ser 0.96 0.61 - 1.24 mg/dL   Calcium 8.3 (L) 8.9 - 10.3 mg/dL   GFR calc non Af Amer >60 >60 mL/min   GFR calc Af Amer >60 >60 mL/min   Anion gap 9 5 - 15    Comment: Performed at [facility], [place] [address], [place], [place] [address]  Phosphorus     Status: None   Collection Time: 04/02/19  3:53 AM  Result Value Ref Range   Phosphorus 3.1 2.5 - 4.6 mg/dL    Comment: Performed at [facility] [address] [place], [place] [address]  Magnesium     Status: Abnormal   Collection Time: 04/02/19  3:53 AM  Result Value Ref Range   Magnesium 2.5 (H) 1.7 - 2.4 mg/dL    Comment: Performed at [facility] [address] [place], [place] [address]  Lactate dehydrogenase     Status: None   Collection Time: 04/02/19  3:53 AM  Result Value Ref Range   LDH 137 98 - 192 U/L    Comment: Performed at [facility], [place] [address], [place], [place] [address]    ECG   N/A   Telemetry   Sinus vs. Atrial flutter in the 70's, baseline artifact - Personally Reviewed  Radiology    Dg Chest 1 View  Result Date: 04/01/2019 CLINICAL DATA:  Endotracheal tube.  Gastric tube EXAM: CHEST  1 VIEW COMPARISON:  04/01/2019 FINDINGS: Endotracheal tube in good position.  NG tube in the stomach. COPD with apical emphysema bilaterally. Small bilateral effusions. Mild bibasilar airspace disease unchanged. IMPRESSION: Endotracheal tube in good position.  NG tube in the stomach Severe COPD with apical emphysema. No change in  mild bibasilar airspace disease and small effusions. Electronically Signed   By: Franchot Gallo M.D.   On: 04/01/2019 18:28   Ct Head Wo Contrast  Result Date: 03/31/2019 CLINICAL DATA:  Respiratory distress, altered mental status EXAM: CT HEAD WITHOUT CONTRAST TECHNIQUE: Contiguous axial images were obtained from the base of the skull through the vertex without intravenous contrast. COMPARISON:  None. FINDINGS: Brain: Diffuse brain atrophy and chronic white matter microvascular ischemic changes throughout both cerebral hemispheres. No acute intracranial hemorrhage mass lesion, infarction, midline shift, herniation, hydrocephalus, or extra-axial fluid collection. No focal mass effect or edema. Cisterns are patent. Cerebellar atrophy as well. Vascular: Intracranial atherosclerosis.  No hyperdense vessel. Skull: Normal. Negative for fracture or focal lesion. Sinuses/Orbits: Minor mucosal thickening throughout the paranasal sinuses. No orbital abnormality or acute finding. Mastoids are clear. Other: None. IMPRESSION: Brain atrophy and chronic white matter microvascular ischemic changes. No acute intracranial abnormality by noncontrast CT. Electronically Signed   By: Jerilynn Mages.  Shick M.D.   On: 03/31/2019 14:57   Dg Chest Port 1 View  Result Date: 04/01/2019 CLINICAL DATA:  Respiratory distress.  Status post intubation today. EXAM: PORTABLE CHEST 1 VIEW COMPARISON:  CT chest  04/30/2018. Single-view of the chest 03/31/2019. PA and lateral chest 03/30/2019. FINDINGS: Endotracheal tube is in place with the tip in good position well above the carina. The patient also has a new NG tube. The tube is visualized into the lower chest but obscured distally due to underpenetration. The lungs are severely emphysematous. Bibasilar airspace disease is again seen. Aeration appears mildly improved in the bases, particularly on the right. No pneumothorax or pleural effusion. Cardiomegaly. Atherosclerosis. No acute or focal bony abnormality. IMPRESSION: ETT in good position. NG tube is visualized into the lower chest. The remainder of the NG tube is not visible due to underpenetration. Bibasilar airspace disease demonstrates some improvement on the right. Severe emphysema. Electronically Signed   By: Inge Rise M.D.   On: 04/01/2019 13:55   Dg Chest Port 1 View  Result Date: 03/31/2019 CLINICAL DATA:  Dyspnea EXAM: PORTABLE CHEST 1 VIEW COMPARISON:  03/30/2019 FINDINGS: Cardiac enlargement.  COPD. Interval development of right lower lobe airspace disease, probable pneumonia. Small right effusion. Scarring in the left upper lobe unchanged. No definite heart failure IMPRESSION: Interval development of right lower lobe infiltrate, probable pneumonia. Small right effusion COPD Electronically Signed   By: Franchot Gallo M.D.   On: 03/31/2019 20:55    Cardiac Studies   ECHOCARDIOGRAM LIMITED REPORT       Patient Name:   NEALY KARAPETIAN Date of Exam: 03/14/2019 Medical Rec #:  [id number]         Height:       69.0 in Accession #:    [id number]        Weight:       143.1 lb Date of Birth:  [birth year]        BSA:          1.79 m Patient Age:    36 years          BP:           132/73 mmHg Patient Gender: M                 HR:           82 bpm. Exam Location:  [facility]    Procedure: 2D Echo  Indications:    Abnormal ECG 794.31 / R94.31   History:  Patient has no prior  history of Echocardiogram examinations.                 COPD; Risk Factors: Dyslipidemia, Hypertension and Current                 Smoker. Prostate Cancer, Elevated Troponin.   Sonographer:    Leavy Cella Referring Phys: [place]    1. The left ventricle has low normal systolic function, with an ejection fraction of 50-55%. The cavity size was normal. Left ventricular diastolic Doppler parameters are consistent with pseudonormal. Elevated mean left atrial pressure No evidence of  left ventricular regional wall motion abnormalities.  2. Small-to-moderate circumferential pericardial effusion, without evidence of tamponade.  3. Increased respiratory flow variation appears related to increased work of breathing, rather than tamponade. The inferior vena cava collapses readily with breathing.  4. Pulmonic valve regurgitation was not assessed by color flow Doppler.  5. The inferior vena cava was dilated in size with >50% respiratory variability.  Assessment   Principal Problem:   Acute on chronic respiratory failure with hypoxia (HCC) Active Problems:   COPD exacerbation (HCC)   Atrial flutter (HCC)   Lobar pneumonia (HCC)   Acute on chronic respiratory failure (HCC)   Pericardial effusion   Plan   1. Rhythm appears regular with HR variability overnight, this is less typical of flutter - significant baseline lead artifact. Would be helpful to document 12-lead EKG with filter on to eliminate artifact and determine if he has converted back to sinus. Nonetheless, would continue q6hr cardizem at current dose and consider consolidating it once respiratory status improves and he can be extubated and taking po. Hemodynamically improving - has small to moderate pericardial effusion. Will consider limited repeat echo next week depending on final results of COVID-19 testing - which is pending.  Time Spent Directly with Patient:  I have spent a total of 35  minutes with the patient reviewing hospital notes, telemetry, EKGs, labs and examining the patient as well as establishing an assessment and plan that was discussed personally with the patient.  > 50% of time was spent in direct patient care.  Length of Stay:  LOS: 3 days   Pixie Casino, MD, [facility], [place] Director of the [facility]  [facility] Diplomate of the American Board of Clinical Lipidology Attending Cardiologist  Direct Dial: [phone]  Fax: [phone]  Website:  [email or web address] 04/02/2019, 7:51 AM

## 2019-04-02 NOTE — Addendum Note (Signed)
Addendum  created 04/02/19 0748 by Vista Deck, CRNA   Charge Capture section accepted, Intraprocedure Event edited

## 2019-04-02 NOTE — Progress Notes (Signed)
Nutrition Follow-up RD working remotely.  DOCUMENTATION CODES:   Not applicable  INTERVENTION:   If unable to extubate patient today, recommend begin TF via OGT:  Vital AF 1.2 at 25 ml/h, increase by 10 ml every 4 hours to goal rate of 55 ml/h (1320 ml per day)  Provides 1584 kcal, 99 gm protein, 1071 ml free water daily  NUTRITION DIAGNOSIS:   Inadequate oral intake related to inability to eat as evidenced by NPO status.  Ongoing   GOAL:   Patient will meet greater than or equal to 90% of their needs  Unmet   MONITOR:   Vent status, Labs, Skin  REASON FOR ASSESSMENT:   Ventilator  ASSESSMENT:   Patient presents to [facility] from home with 2-days increased SOB / COPD exacerbation. Additional hx of HTN, alcohol and tobacco abuse. Transferred to [facility] MICU and intubated on 4/8. R/O COVID-19.  Intake at [facility] was minimal, 0-10% meal completion.   Noted plans to begin TF if unable to extubate patient today. OGT in place.   Patient is currently intubated on ventilator support MV: 9 L/min Temp (24hrs), Avg:97.8 F (36.6 C), Min:97.2 F (36.2 C), Max:98.4 F (36.9 C)  Propofol: 5.4 ml/hr providing 143 kcal from lipid  Labs reviewed. BUN 41 CBG: 122 this AM Medications reviewed and include folic acid, MVI, solumedrol, thiamine, propofol.   NUTRITION - FOCUSED PHYSICAL EXAM:  unable to complete  Diet Order:   Diet Order    None      EDUCATION NEEDS:   Not appropriate for education at this time  Skin:  Skin Assessment: Reviewed RN Assessment  Last BM:  4/8 (type 4)  Height:   Ht Readings from Last 1 Encounters:  04/01/19 5\' 10"  (1.778 m)    Weight:   Wt Readings from Last 1 Encounters:  04/02/19 61.7 kg    Ideal Body Weight:  75 kg  BMI:  Body mass index is 19.52 kg/m.  Estimated Nutritional Needs:   Kcal:  1550  Protein:  90-105 gm  Fluid:  1900 ml daily    Molli Barrows, RD, LDN, [employer] Pager [phone] After Hours Pager [phone]

## 2019-04-02 NOTE — Consult Note (Signed)
Consultation Note Date: 04/02/2019   Patient Name: Joshua Whitaker  DOB: 06-Nov-1941  MRN: [id number]  Age / Sex: 78 y.o., male  PCP: [name], MD Referring Physician: Wilhelmina Mcardle, MD  Reason for Consultation: Establishing goals of care and Psychosocial/spiritual support  HPI/Patient Profile: 78 y.o. male   admitted on 03/30/2019 with  prior history of COPD, CAD, ETOH/ tobacco abuse, HTN, HLD, chronic hyponatremia presenting to [facility] on 4/6 with 2 day history of shortness of breath and cough.    Of note, prior hospitalization 3/20 to 3/23 for AECOPD and severe hyponatremia attributed to beer potomania, discharged home on prednisone taper.  Additionally found to have elevated troponin and abnormal EKG, however TTE did not show wall motion abnormalities and he was medically managed.  He was admitted and started on bronchodilators and solumedrol. Initial CXR showed left greater than right patchy densities.  On 4/7 evening, he started to desaturate requiring BiPAP.  On 4/8 am, he briefly came off BiPAP, received ativan and was unable to protect his airway, therefore intubated by anesthesiology.    Hospitalization complicated by atrial flutter with RVR and elevated troponin's which were flat in which cardiology has been consulted.  He has been afebrile.  Empirically treated with levaquin x 1, and then vancomycin and cefepime. Some concern for COVID-19, pending rule out- tested at [facility].  Transfer to [facility] for further treatment and stabilization.  Currently intubated and in the MICU  Family face treatment option decisions, advanced directive decisions and anticipatory care needs.     Clinical Assessment and Goals of Care:   This NP Wadie Lessen reviewed medical records, received report from team, then spoke to multiple family members to include wife, son and daughter Joshua Whitaker to discuss diagnosis, prognosis,  [place], EOL wishes disposition and options.  Family has made decision to make Joshua Whitaker Wilson/daughter/[phone] the main contact person to receive all updates regarding patient's condition and she will then disseminate the information to various family members.  Concept of Palliative Care was discussed  A detailed discussion was had today regarding advanced directives.  Concepts specific to code status, artifical feeding and hydration, continued IV antibiotics and rehospitalization was had.  The difference between a aggressive medical intervention path  and a palliative comfort care path for this patient at this time was had.  Values and goals of care important to patient and family were attempted to be elicited.  Most of my conversation was had with Joshua Whitaker today.  She verbalizes an understanding and describes a continued physical and functional decline of her father over the past year.  Reports that he "continued to smoke and did not take care of himself".  Feels that her father has lost the will to live over the last few years having experienced the death of multiple siblings.  There is no documented advanced directive and family is on sure of their fathers advanced directive decisions specific to life support.  At this time family wish to continue to treat the treatable and hope for improvement.  Ultimately they hope that the patient will wean from the ventilator and be able to have specific discussion and decision making for himself.  Questions and concerns addressed.   Family encouraged to call with questions or concerns.    PMT will continue to support holistically.   SUMMARY OF RECOMMENDATIONS    Code Status/Advance Care Planning:  Full code   Family is open to all offered and available medical interventions to prolong life at this time.  They understand the seriousness of the current medical situation and the likely difficult decisions they face moving forward specific to treatment  options, advanced directive decisions and anticipatory care needs.  Palliative Prophylaxis:   Aspiration, Bowel Regimen, Delirium Protocol, Frequent Pain Assessment and Oral Care  Additional Recommendations (Limitations, Scope, Preferences):  Full Scope Treatment  Psycho-social/Spiritual:   Desire for further Chaplaincy support:no  Additional Recommendations: Created space and opportunity for family to explore their thoughts and feelings regarding the patient's current medical situation and pending decisions   Prognosis:   Unable to determine  Discharge Planning: To Be Determined      Primary Diagnoses: Present on Admission:  Atrial flutter ([place])  Acute on chronic respiratory failure with hypoxia (HCC)  COPD exacerbation (HCC)  Acute on chronic respiratory failure (HCC)  Pericardial effusion   I have reviewed the medical record, interviewed the patient and family, and examined the patient. The following aspects are pertinent.  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   Food insecurity:    Worry: Never true    Inability: Never true   Transportation needs:    Medical: No    Non-medical: No  Tobacco Use   Smoking status: Current Every Day Smoker    Packs/day: 1.00    Types: Cigarettes   Smokeless tobacco: Never Used  Substance and Sexual Activity   Alcohol use: Yes   Drug use: No    Sexual activity: Not Currently  Lifestyle   Physical activity:    Days per week: 5 days    Minutes per session: 30 min   Stress: Only a little  Relationships   Social connections:    Talks on phone: More than three times a week    Gets together: More than three times a week    Attends religious service: Never    Active member of club or organization: No    Attends meetings of clubs or organizations: Never    Relationship status: Not on file  Other Topics Concern   Not on file  Social History Narrative   Not on file   Family History  Problem Relation Age of Onset   Heart disease Mother    Arthritis Mother    Heart disease Father    Cancer Sister        kidney  Cancer Brother        lung   Cancer Brother        lung   Stroke Brother    Cancer Brother        prostate   Cancer Sister        colon   Cancer Sister        bone   Diabetes Sister    Chromosomal disorder Sister    Alcohol abuse Daughter    Rheum arthritis Daughter    Rheum arthritis Daughter    Scheduled Meds:  atorvastatin  80 mg Per Tube Q24H   budesonide (PULMICORT) nebulizer solution  0.25 mg Nebulization BID   chlorhexidine gluconate (MEDLINE KIT)  15 mL Mouth Rinse BID   Chlorhexidine Gluconate Cloth  6 each Topical Q2200   clopidogrel  75 mg Per Tube Daily   diltiazem  60 mg Oral Q6H   enoxaparin (LOVENOX) injection  40 mg Subcutaneous D98P   folic acid  1 mg Per Tube Daily   ipratropium-albuterol  3 mL Nebulization Q6H   mouth rinse  15 mL Mouth Rinse 10 times per day   methylPREDNISolone (SOLU-MEDROL) injection  80 mg Intravenous Q12H   multivitamin with minerals  1 tablet Per Tube Daily   pantoprazole sodium  40 mg Per Tube QHS   thiamine  100 mg Per Tube Daily   Continuous Infusions:  sodium chloride Stopped (04/01/19 1240)   ceFEPime (MAXIPIME) IV 2 g (04/02/19 0510)   propofol (DIPRIVAN) infusion Stopped (04/02/19 0905)   PRN Meds:.acetaminophen  **OR** acetaminophen, albuterol, fentaNYL (SUBLIMAZE) injection, LORazepam, [DISCONTINUED] ondansetron **OR** ondansetron (ZOFRAN) IV Medications Prior to Admission:  Prior to Admission medications   Medication Sig Start Date End Date Taking? Authorizing Provider  aspirin 325 MG tablet Take 650 mg by mouth daily as needed for mild pain.    Yes [provider]  atorvastatin (LIPITOR) 80 MG tablet Take 1 tablet (80 mg total) by mouth daily at 6 PM. 03/25/19  Yes Stacks, Cletus Gash, MD  clopidogrel (PLAVIX) 75 MG tablet Take 1 tablet (75 mg total) by mouth daily. 03/17/19  Yes Dessa Phi, DO  escitalopram (LEXAPRO) 5 MG tablet Take 5 mg by mouth every morning. 03/10/19  Yes [provider]  fluticasone (FLONASE) 50 MCG/ACT nasal spray Place 1 spray into both nostrils daily. 10/15/18  Yes Stacks, Cletus Gash, MD  Fluticasone-Umeclidin-Vilant (TRELEGY ELLIPTA) 100-62.5-25 MCG/INH AEPB Inhale 1 puff into the lungs daily. 10/15/18  Yes Stacks, Cletus Gash, MD  folic acid (FOLVITE) 1 MG tablet Take 1 tablet (1 mg total) by mouth daily. 03/17/19  Yes Dessa Phi, DO  Ipratropium-Albuterol (COMBIVENT RESPIMAT) 20-100 MCG/ACT AERS respimat Inhale 2 puffs into the lungs every 6 (six) hours. 10/15/18  Yes [name], MD  megestrol (MEGACE) 400 MG/10ML suspension Take 10 mLs (400 mg total) by mouth 2 (two) times daily. For appetite stimulation 03/23/19  Yes Stacks, Cletus Gash, MD  predniSONE (DELTASONE) 10 MG tablet Take 5 daily for 3 days followed by 4,3,2 and 1 for 3 days each. 03/23/19  Yes [name], MD  PROAIR HFA 108 [phone] Base) MCG/ACT inhaler Inhale 1-2 puffs into the lungs every 6 (six) hours as needed for wheezing or shortness of breath. Patient taking differently: Inhale 1-2 puffs into the lungs every 6 (six) hours as needed for wheezing or shortness of breath.  01/30/19  Yes Hassell Done, Amesha Bailey-Margaret, FNP  thiamine 100 MG tablet Take 1 tablet (100 mg total) by mouth daily. 03/17/19  Yes Dessa Phi,  DO   Allergies  Allergen Reactions   Tiotropium Bromide Monohydrate Other (See Comments)    Severe urinary retention   Varenicline Other (See Comments)    Psychiatric and dreams   Iodinated Diagnostic Agents    Statins    Review of Systems  Unable to perform ROS: Intubated    Physical Exam  Hands-on physical exam forgone secondary to pending COVID-19 test  Vital Signs: BP (!) 112/53    Pulse 72    Temp 98.4 F (36.9 C) (Oral)    Resp 18    Ht '5\' 10"'$  (1.778 m)    Wt 61.7 kg    SpO2 100%    BMI 19.52 kg/m  Pain Scale: CPOT POSS *See Group Information*: 1-Acceptable,Awake and alert Pain Score: Asleep   SpO2: SpO2: 100 % O2 Device:SpO2: 100 % O2 Flow Rate: .O2 Flow Rate (L/min): 4 L/min  IO: Intake/output summary:   Intake/Output Summary (Last 24 hours) at 04/02/2019 0946 Last data filed at 04/02/2019 0900 Gross per 24 hour  Intake 465.21 ml  Output 865 ml  Net -399.79 ml    LBM: Last BM Date: 04/01/19 Baseline Weight: Weight: 64.4 kg Most recent weight: Weight: 61.7 kg     Palliative Assessment/Data:   Flowsheet Rows     Most Recent Value  Intake Tab  Unit at Time of Referral  ICU  Palliative Care Primary Diagnosis  Pulmonary  Date Notified  04/01/19  Palliative Care Type  New Palliative care  Reason for referral  Clarify Goals of Care  Date of Admission  03/30/19  # of days IP prior to Palliative referral  2  Clinical Assessment  Psychosocial & Spiritual Assessment  Palliative Care Outcomes     Discussed with bedside RN  Time In: 0830 Time Out: 0945 Time Total: 75 minutes Greater than 50%  of this time was spent counseling and coordinating care related to the above assessment and plan.  Signed by: Wadie Lessen, NP   Please contact Palliative Medicine Team phone at [phone] for questions and concerns.  For individual provider: See Shea Evans

## 2019-04-02 NOTE — Progress Notes (Signed)
NAME:  Joshua Whitaker, MRN:  [id number], DOB:  1941/12/06, LOS: 3 ADMISSION DATE:  03/30/2019, CONSULTATION DATE:  04/01/2019 REFERRING MD:  Dr. Carles Collet, CHIEF COMPLAINT:  Respiratory failure  Brief History   40 yoM transfer from [place].  Admitted 4/6 with 2 day hx of cough and SOB.  Recent admit for AECOPD.  Developed respiratory failure 4/7 requiring intubation.  COVID-19 r/o.  Hospitalization complicated by atrial flutter with RVR and elevated troponin's which were flat in which cardiology has been consulted.   Past Medical History  COPD, CAD, ETOH/ tobacco abuse, HTN, HLD, chronic hyponatremia  Significant Hospital Events   3/20 to 3/23 for AECOPD and severe hyponatremia 4/6 Admitted to [facility] 4/8 Intubated/ tx to [facility]  Consults:  Cards - [facility]  PMT 4/8  Procedures:  4/8 ETT >>  Significant Diagnostic Tests:   Micro Data:  4/6 MRSA PCR >> neg 4/8 RVP >> 4/8 COVID NAA >> 4/8 trach asp >>  Antimicrobials:  4/6 levaquin x 1 4/7 vanc >> 4/8 4/7 cefepime >> 4/9 Ceftriaxone 4/9 >> Azithro 4/9 >>  Interim history/subjective:  No events overnight On 40% Fio2, 5 PEEP  Objective   Blood pressure 122/60, pulse 76, temperature 98.4 F (36.9 C), temperature source Oral, resp. rate 19, height 5\' 10"  (1.778 m), weight 61.7 kg, SpO2 100 %.    Vent Mode: CPAP;PSV FiO2 (%):  [40 %-100 %] 40 % Set Rate:  [14 bmp] 14 bmp Vt Set:  [520 mL-600 mL] 600 mL PEEP:  [5 cmH20] 5 cmH20 Pressure Support:  [10 cmH20] 10 cmH20 Plateau Pressure:  [14 cmH20-15 cmH20] 15 cmH20   Intake/Output Summary (Last 24 hours) at 04/02/2019 1105 Last data filed at 04/02/2019 0900 Gross per 24 hour  Intake 465.21 ml  Output 865 ml  Net -399.79 ml   Filed Weights   03/31/19 0600 04/01/19 0600 04/02/19 0500  Weight: 61.6 kg 61.5 kg 61.7 kg    Examination:. Gen:      Chronically ill appearing HEENT:  EOMI, sclera anicteric Neck:     No masses; no thyromegaly, ETT Lungs:    Clear to auscultation  bilaterally; normal respiratory effort CV:         Regular rate and rhythm; no murmurs Abd:      + bowel sounds; soft, non-tender; no palpable masses, no distension Ext:    No edema; adequate peripheral perfusion Skin:      Warm and dry; no rash Neuro: Sedated, unresponsive  Resolved Hospital Problem list    Assessment & Plan:  Acute on chronic hypoxic respiratory failure AECOPD R/o PNA, L>R patchy infiltrate  - prior CXR to transport- ETT stable  P:  PSV weans as tolerated Lighten sedation Continue abx. Narrow to ceftriaxone, azithro Continue nebs, solumedrol  COVID testing is pending but doubt it will be positive  ETOH abuse  P:  Continue thiamine, folate, MVI Ativan for CIWA  Aflutter - currently in SR P:  Continue po cardizem Anticoagulation per cardiology reccs Tele monitoring  Elevated troponin's - no EKG changes - prior workup 3/20 with no wall motion abnormalities  P:  Monitor  Continue ASA, plavix  HLD P:  Continue statin   Best practice:  Diet: NPO. Start tybe feeds if not extubated today Pain/Anxiety/Delirium protocol (if indicated): CIWA, prn fentanyl VAP protocol (if indicated): yes DVT prophylaxis: SCDs, Loevenox GI prophylaxis: PPI Glucose control: trend on BMP Mobility: BR Code Status: Full, PMT consulted for [place] Family Communication: no family at bedside.  Disposition: ICU  Labs   CBC: Recent Labs  Lab 03/30/19 1646 03/31/19 0413 04/01/19 0417 04/01/19 1608 04/02/19 0353  WBC 21.4* 14.6* 11.5*  --  12.3*  NEUTROABS  --   --  10.3*  --   --   HGB 12.3* 11.3* 11.4* 10.2* 10.2*  HCT 38.3* 35.8* 34.9* 30.0* 32.4*  MCV 95.0 95.0 93.1  --  92.6  PLT 350 324 316  --  353    Basic Metabolic Panel: Recent Labs  Lab 03/30/19 1646 03/31/19 0413 04/01/19 0417 04/01/19 1608 04/02/19 0353  NA 140 137 139 139 139  K 4.5 4.6 4.5 4.6 4.2  CL 105 105 107  --  107  CO2 24 23 24   --  23  GLUCOSE 91 130* 131*  --  132*  BUN 31* 27* 40*   --  41*  CREATININE 0.83 0.68 0.82  --  0.96  CALCIUM 8.5* 8.0* 8.2*  --  8.3*  MG  --   --   --   --  2.5*  PHOS  --   --   --   --  3.1   GFR: Estimated Creatinine Clearance: 58 mL/min (by C-G formula based on SCr of 0.96 mg/dL). Recent Labs  Lab 03/30/19 1646 03/31/19 0413 04/01/19 0417 04/01/19 1329 04/02/19 0353  PROCALCITON  --   --   --  <0.10 <0.10  WBC 21.4* 14.6* 11.5*  --  12.3*    Liver Function Tests: Recent Labs  Lab 03/30/19 1657 03/31/19 0413  AST 74* 47*  ALT 92* 75*  ALKPHOS 117 97  BILITOT 0.3 0.3  PROT 6.3* 5.7*  ALBUMIN 3.0* 2.5*   No results for input(s): LIPASE, AMYLASE in the last 168 hours. No results for input(s): AMMONIA in the last 168 hours.  ABG    Component Value Date/Time   PHART 7.361 04/01/2019 1608   PCO2ART 46.9 04/01/2019 1608   PO2ART 173.0 (H) 04/01/2019 1608   HCO3 26.6 04/01/2019 1608   TCO2 28 04/01/2019 1608   O2SAT 99.0 04/01/2019 1608     Coagulation Profile: No results for input(s): INR, PROTIME in the last 168 hours.  Cardiac Enzymes: Recent Labs  Lab 03/30/19 1646 03/30/19 1833 03/30/19 2251 03/31/19 0413 04/01/19 1329  CKTOTAL  --   --   --   --  40*  TROPONINI 1.12* 1.14* 1.45* 1.36*  --     HbA1C: Hgb A1c MFr Bld  Date/Time Value Ref Range Status  03/14/2019 04:17 AM 5.1 4.8 - 5.6 % Final    Comment:    (NOTE) Pre diabetes:          5.7%-6.4% Diabetes:              >6.4% Glycemic control for   <7.0% adults with diabetes     CBG: Recent Labs  Lab 03/30/19 2147 04/01/19 2001 04/02/19 0318  GLUCAP 80 114* 122*   The patient is critically ill with multiple organ system failure and requires high complexity decision making for assessment and support, frequent evaluation and titration of therapies, advanced monitoring, review of radiographic studies and interpretation of complex data.   Critical Care Time devoted to patient care services, exclusive of separately billable procedures,  described in this note is 35 minutes.   Marshell Garfinkel MD [facility] Pager [phone] If no answer call [phone] 04/02/2019, 11:13 AM

## 2019-04-02 NOTE — Progress Notes (Signed)
[facility] tele visit with Dr. Debara Pickett

## 2019-04-03 DIAGNOSIS — I4892 Unspecified atrial flutter: Secondary | ICD-10-CM

## 2019-04-03 DIAGNOSIS — J189 Pneumonia, unspecified organism: Secondary | ICD-10-CM

## 2019-04-03 DIAGNOSIS — R7989 Other specified abnormal findings of blood chemistry: Secondary | ICD-10-CM

## 2019-04-03 DIAGNOSIS — J449 Chronic obstructive pulmonary disease, unspecified: Secondary | ICD-10-CM

## 2019-04-03 LAB — PROCALCITONIN: Procalcitonin: <0.1 ng/mL

## 2019-04-03 LAB — GLUCOSE, CAPILLARY
Glucose-Capillary: 102 mg/dL — ABNORMAL HIGH (ref 70–99)
Glucose-Capillary: 97 mg/dL (ref 70–99)
Glucose-Capillary: 129 mg/dL — ABNORMAL HIGH (ref 70–99)

## 2019-04-03 LAB — PHOSPHORUS: Phosphorus: 2.7 mg/dL (ref 2.5–4.6)

## 2019-04-03 LAB — MAGNESIUM: Magnesium: 2.4 mg/dL (ref 1.7–2.4)

## 2019-04-03 MED ORDER — VITAL HIGH PROTEIN PO LIQD
1000.0000 mL | ORAL | Status: DC
  Administered 2019-04-03: 1000 mL

## 2019-04-03 MED ORDER — PREDNISONE 10 MG PO TABS: 10.0000 mg | ORAL_TABLET | Freq: Every day | ORAL | Status: DC

## 2019-04-03 MED ORDER — PREDNISONE 20 MG PO TABS: 20.0000 mg | ORAL_TABLET | Freq: Every day | ORAL | Status: DC

## 2019-04-03 MED ORDER — PRO-STAT SUGAR FREE PO LIQD
30.0000 mL | Freq: Two times a day (BID) | ORAL | Status: DC
  Administered 2019-04-03: 30 mL
  Filled 2019-04-03: qty 30

## 2019-04-03 MED ORDER — PREDNISONE 20 MG PO TABS: 30.0000 mg | ORAL_TABLET | Freq: Every day | ORAL | Status: DC

## 2019-04-03 MED ORDER — PREDNISONE 20 MG PO TABS
40.0000 mg | ORAL_TABLET | Freq: Every day | ORAL | Status: DC
  Administered 2019-04-04: 40 mg via ORAL
  Filled 2019-04-03 (×2): qty 2

## 2019-04-03 MED ORDER — BUDESONIDE 0.5 MG/2ML IN SUSP
0.5000 mg | Freq: Two times a day (BID) | RESPIRATORY_TRACT | Status: DC
  Administered 2019-04-03 – 2019-04-04 (×2): 0.5 mg via RESPIRATORY_TRACT
  Filled 2019-04-03 (×2): qty 2

## 2019-04-03 NOTE — Progress Notes (Signed)
NAME:  Joshua Whitaker, MRN:  [id number], DOB:  Feb 19, 1941, LOS: 4 ADMISSION DATE:  03/30/2019, CONSULTATION DATE:  04/01/2019 REFERRING MD:  Dr. Carles Collet, CHIEF COMPLAINT:  Respiratory failure  Brief History   29 yoM transfer from [place].  Admitted 4/6 with 2 day hx of cough and SOB.  Recent admit for AECOPD.  Developed respiratory failure 4/7 requiring intubation.  COVID-19 r/o.  Hospitalization complicated by atrial flutter with RVR and elevated troponin's which were flat in which cardiology has been consulted.   Past Medical History  COPD, CAD, ETOH/ tobacco abuse, HTN, HLD, chronic hyponatremia  Significant Hospital Events   3/20 to 3/23 for AECOPD and severe hyponatremia 4/6 Admitted to [facility] 4/8 Intubated/ tx to [facility]  Consults:  Cards - [facility]  PMT 4/8  Procedures:  4/8 ETT >>  Significant Diagnostic Tests:   Micro Data:  4/6 MRSA PCR >> neg 4/8 RVP >> neg  4/8 COVID NAA >> pending  4/8 trach asp >> GPC,. GPRs   Antimicrobials:  4/6 levaquin x 1 4/7 vanc >> 4/8 4/7 cefepime >> 4/9 Ceftriaxone 4/9 >> Azithro 4/9 >>  Interim history/subjective:  Sedated on vent, no distress. No issues. Tolerating SBT this am.   Objective   Blood pressure (!) 124/56, pulse 72, temperature 98.4 F (36.9 C), resp. rate 14, height 5\' 10"  (1.778 m), weight 60.3 kg, SpO2 100 %.    Vent Mode: CPAP;PSV FiO2 (%):  [40 %] 40 % Set Rate:  [14 bmp] 14 bmp Vt Set:  [600 mL] 600 mL PEEP:  [5 cmH20] 5 cmH20 Pressure Support:  [10 cmH20] 10 cmH20 Plateau Pressure:  [12 cmH20-13 cmH20] 12 cmH20   Intake/Output Summary (Last 24 hours) at 04/03/2019 0954 Last data filed at 04/03/2019 0900 Gross per 24 hour  Intake 540.4 ml  Output 1035 ml  Net -494.6 ml   Filed Weights   04/01/19 0600 04/02/19 0500 04/03/19 0500  Weight: 61.5 kg 61.7 kg 60.3 kg    Examination:. Gen:      elderly male, chronically ill appearing, intubated  HEENT:  NCAT, sclera clear  Neck:     No mass, ett  Lungs:   bl vented  breaths  CV:         RRR, s1 s2 Abd:     +BS, NT ND  Ext:   No edema, cool le BL  Skin:      Cool BL LE  Neuro:  Sedated on vent, comfortable, no alert enough to extubate   Resolved Hospital Problem list    Assessment & Plan:   Acute on chronic hypoxic respiratory failure, intubated on mechanical ventilation  AECOPD Right sided pneumonia  - CXR reviewed - LUL infiltrate, copd changes, The patient's images have been independently reviewed by me.   P:  Remains in ICU on vent  Weaning as tolerated  Sedation held Encourage PRNS only  Continue Ceftriaxone and azithro Changed solumedrol to pred taper  Continue nebs throught vent circuit  May consider liberation from vent today if mental status improves    ETOH abuse  P:  Continue MVI and thiamine and folate  CIWA protocol in place   Atrial flutter/fib  P:  We will resume apixiban once extubated   Elevated troponin - no EKG changes - prior workup 3/20 with no wall motion abnormalities  P:  Continue ASA and plavix  Appreciate cards recs   Positive D-dimer  - albeit lower, is positive  - with AECOPD, one must consider  PE, but as a COVID r/o will not change management at this time - will plan for start [facility] of apixiban once extubated   HLD P:  On statin   Best practice:  Diet: NPO. Start tybe feeds if not extubated today Pain/Anxiety/Delirium protocol (if indicated): CIWA, prn fentanyl VAP protocol (if indicated): yes DVT prophylaxis: SCDs, Loevenox GI prophylaxis: PPI Glucose control: trend on BMP Mobility: BR Code Status: Full, PMT consulted for [place] Family Communication: no family at bedside.  Disposition: ICU  Labs   CBC: Recent Labs  Lab 03/30/19 1646 03/31/19 0413 04/01/19 0417 04/01/19 1608 04/02/19 0353  WBC 21.4* 14.6* 11.5*  --  12.3*  NEUTROABS  --   --  10.3*  --   --   HGB 12.3* 11.3* 11.4* 10.2* 10.2*  HCT 38.3* 35.8* 34.9* 30.0* 32.4*  MCV 95.0 95.0 93.1  --  92.6  PLT 350 324 316  --  277     Basic Metabolic Panel: Recent Labs  Lab 03/30/19 1646 03/31/19 0413 04/01/19 0417 04/01/19 1608 04/02/19 0353  NA 140 137 139 139 139  K 4.5 4.6 4.5 4.6 4.2  CL 105 105 107  --  107  CO2 24 23 24   --  23  GLUCOSE 91 130* 131*  --  132*  BUN 31* 27* 40*  --  41*  CREATININE 0.83 0.68 0.82  --  0.96  CALCIUM 8.5* 8.0* 8.2*  --  8.3*  MG  --   --   --   --  2.5*  PHOS  --   --   --   --  3.1   GFR: Estimated Creatinine Clearance: 56.7 mL/min (by C-G formula based on SCr of 0.96 mg/dL). Recent Labs  Lab 03/30/19 1646 03/31/19 0413 04/01/19 0417 04/01/19 1329 04/02/19 0353 04/03/19 0324  PROCALCITON  --   --   --  <0.10 <0.10 <0.10  WBC 21.4* 14.6* 11.5*  --  12.3*  --     Liver Function Tests: Recent Labs  Lab 03/30/19 1657 03/31/19 0413  AST 74* 47*  ALT 92* 75*  ALKPHOS 117 97  BILITOT 0.3 0.3  PROT 6.3* 5.7*  ALBUMIN 3.0* 2.5*   No results for input(s): LIPASE, AMYLASE in the last 168 hours. No results for input(s): AMMONIA in the last 168 hours.  ABG    Component Value Date/Time   PHART 7.361 04/01/2019 1608   PCO2ART 46.9 04/01/2019 1608   PO2ART 173.0 (H) 04/01/2019 1608   HCO3 26.6 04/01/2019 1608   TCO2 28 04/01/2019 1608   O2SAT 99.0 04/01/2019 1608     Coagulation Profile: No results for input(s): INR, PROTIME in the last 168 hours.  Cardiac Enzymes: Recent Labs  Lab 03/30/19 1646 03/30/19 1833 03/30/19 2251 03/31/19 0413 04/01/19 1329  CKTOTAL  --   --   --   --  40*  TROPONINI 1.12* 1.14* 1.45* 1.36*  --     HbA1C: Hgb A1c MFr Bld  Date/Time Value Ref Range Status  03/14/2019 04:17 AM 5.1 4.8 - 5.6 % Final    Comment:    (NOTE) Pre diabetes:          5.7%-6.4% Diabetes:              >6.4% Glycemic control for   <7.0% adults with diabetes     CBG: Recent Labs  Lab 03/30/19 2147 04/01/19 2001 04/02/19 0318 04/02/19 2107  GLUCAP 80 114* 122* 117*    This patient is  critically ill with multiple organ system  failure; which, requires frequent high complexity decision making, assessment, support, evaluation, and titration of therapies. This was completed through the application of advanced monitoring technologies and extensive interpretation of multiple databases. During this encounter critical care time was devoted to patient care services described in this note for 38 minutes.   Garner Nash, DO [place] [facility] 04/03/2019 9:54 AM  Personal pager: [phone] If unanswered, please page CCM On-call: [phone]

## 2019-04-03 NOTE — Progress Notes (Signed)
EKG yesterday shows sinus rhythm with PAC's. Telemetry also demonstrates conversion to sinus rhythm. Would continue scheduled diltiazem and convert to LA when extubated. Consider limited echo for follow-up of pericardial effusion next week, if COVID-19 result is negative (thought to be low risk). Currently on prophylactic lovenox - was on apixaban (appropriately), but discontinued when intubated - would resume apixiban if extubated today, however if intubation persists, then consider IV Heparin until able to take po - CHADSVASC score of 3.  Pixie Casino, MD, [facility] [place] Director of the [facility] Lipid [facility] &  Cardiovascular Risk Reduction Clinic Diplomate of the American Board of Clinical Lipidology Attending Cardiologist  Direct Dial: [phone]  Fax: [phone]  Website:  [email or web address]

## 2019-04-03 NOTE — Progress Notes (Signed)
Spoke w/ pts dtr [name] and provided updates on pt. [name] appreciative.

## 2019-04-03 NOTE — Progress Notes (Signed)
Spoke w/ pts wife and provided updates. Pts wife appreciative.

## 2019-04-03 NOTE — Progress Notes (Signed)
RN spoke with pts daughter [name] and provided updates. Pts daughter appreciative and given opportunity to ask questions. Clint Bolder, RN 04/03/2019 09:30 PM

## 2019-04-04 ENCOUNTER — Telehealth: Payer: Self-pay | Admitting: Physician Assistant

## 2019-04-04 ENCOUNTER — Other Ambulatory Visit: Payer: Self-pay | Admitting: Physician Assistant

## 2019-04-04 DIAGNOSIS — I4892 Unspecified atrial flutter: Secondary | ICD-10-CM

## 2019-04-04 DIAGNOSIS — R6889 Other general symptoms and signs: Secondary | ICD-10-CM

## 2019-04-04 LAB — COMPREHENSIVE METABOLIC PANEL
ALT: 123 U/L — ABNORMAL HIGH (ref 0–44)
AST: 62 U/L — ABNORMAL HIGH (ref 15–41)
Albumin: 2.4 g/dL — ABNORMAL LOW (ref 3.5–5.0)
Alkaline Phosphatase: 69 U/L (ref 38–126)
Anion gap: 12 (ref 5–15)
BUN: 33 mg/dL — ABNORMAL HIGH (ref 8–23)
CO2: 25 mmol/L (ref 22–32)
Calcium: 8.6 mg/dL — ABNORMAL LOW (ref 8.9–10.3)
Chloride: 108 mmol/L (ref 98–111)
Creatinine, Ser: 0.72 mg/dL (ref 0.61–1.24)
GFR calc Af Amer: >60 mL/min (ref >60)
GFR calc non Af Amer: >60 mL/min (ref >60)
Glucose, Bld: 136 mg/dL — ABNORMAL HIGH (ref 70–99)
Potassium: 3.3 mmol/L — ABNORMAL LOW (ref 3.5–5.1)
Sodium: 145 mmol/L (ref 135–145)
Total Bilirubin: 0.5 mg/dL (ref 0.3–1.2)
Total Protein: 5.3 g/dL — ABNORMAL LOW (ref 6.5–8.1)

## 2019-04-04 LAB — CULTURE, RESPIRATORY W GRAM STAIN: Culture: NORMAL

## 2019-04-04 LAB — CBC
HCT: 37.4 % — ABNORMAL LOW (ref 39.0–52.0)
Hemoglobin: 12 g/dL — ABNORMAL LOW (ref 13.0–17.0)
MCH: 30.1 pg (ref 26.0–34.0)
MCHC: 32.1 g/dL (ref 30.0–36.0)
MCV: 93.7 fL (ref 80.0–100.0)
Platelets: 276 10*3/uL (ref 150–400)
RBC: 3.99 MIL/uL — ABNORMAL LOW (ref 4.22–5.81)
RDW: 13.9 % (ref 11.5–15.5)
WBC: 15 10*3/uL — ABNORMAL HIGH (ref 4.0–10.5)
nRBC: 0 % (ref 0.0–0.2)

## 2019-04-04 LAB — GLUCOSE, CAPILLARY
Glucose-Capillary: 120 mg/dL — ABNORMAL HIGH (ref 70–99)
Glucose-Capillary: 71 mg/dL (ref 70–99)
Glucose-Capillary: 82 mg/dL (ref 70–99)

## 2019-04-04 LAB — PHOSPHORUS
Phosphorus: 1.7 mg/dL — ABNORMAL LOW (ref 2.5–4.6)
Phosphorus: 2.2 mg/dL — ABNORMAL LOW (ref 2.5–4.6)

## 2019-04-04 LAB — MAGNESIUM
Magnesium: 2.2 mg/dL (ref 1.7–2.4)
Magnesium: 2.2 mg/dL (ref 1.7–2.4)

## 2019-04-04 LAB — CULTURE, RESPIRATORY

## 2019-04-04 MED ORDER — APIXABAN 5 MG PO TABS
5.0000 mg | ORAL_TABLET | Freq: Two times a day (BID) | ORAL | Status: DC
  Filled 2019-04-04 (×2): qty 1

## 2019-04-04 MED ORDER — METOPROLOL TARTRATE 5 MG/5ML IV SOLN: 5.0000 mg | INTRAVENOUS | Status: DC | PRN

## 2019-04-04 MED ORDER — ORAL CARE MOUTH RINSE
15.0000 mL | Freq: Two times a day (BID) | OROMUCOSAL | Status: DC
  Administered 2019-04-04: 15 mL via OROMUCOSAL

## 2019-04-04 MED ORDER — POTASSIUM PHOSPHATES 15 MMOLE/5ML IV SOLN
20.0000 mmol | Freq: Once | INTRAVENOUS | Status: AC
  Administered 2019-04-04: 20 mmol via INTRAVENOUS
  Filled 2019-04-04: qty 6.67

## 2019-04-04 MED ORDER — DEXTROSE 5 % IV SOLN
250.0000 mg | INTRAVENOUS | Status: DC
  Administered 2019-04-04: 250 mg via INTRAVENOUS
  Filled 2019-04-04 (×2): qty 250

## 2019-04-04 MED ORDER — UMECLIDINIUM BROMIDE 62.5 MCG/INH IN AEPB
1.0000 | INHALATION_SPRAY | Freq: Every day | RESPIRATORY_TRACT | Status: DC
  Administered 2019-04-05 – 2019-04-10 (×6): 1 via RESPIRATORY_TRACT
  Filled 2019-04-04 (×2): qty 7

## 2019-04-04 MED ORDER — POTASSIUM CHLORIDE 20 MEQ/15ML (10%) PO SOLN
40.0000 meq | Freq: Once | ORAL | Status: DC
  Filled 2019-04-04: qty 30

## 2019-04-04 MED ORDER — FLUTICASONE FUROATE-VILANTEROL 100-25 MCG/INH IN AEPB
1.0000 | INHALATION_SPRAY | Freq: Every day | RESPIRATORY_TRACT | Status: DC
  Administered 2019-04-05 – 2019-04-10 (×5): 1 via RESPIRATORY_TRACT
  Filled 2019-04-04 (×2): qty 28

## 2019-04-04 NOTE — Procedures (Signed)
Extubation Procedure Note  Patient Details:   Name: Joshua Whitaker DOB: [birth year] MRN: [id number]   Airway Documentation:  Airway 7.5 mm (Active)  Secured at (cm) 23 cm 04/03/2019  4:00 PM  Measured From Lips 04/03/2019  4:00 PM  Secured Location Right 04/03/2019  2:51 PM  Secured By [employer] 04/03/2019  2:51 PM  Tube Holder Repositioned Yes 04/03/2019  2:51 PM   Vent end date: 04/04/19 Vent end time: 1200   Evaluation  O2 sats: stable throughout Complications: No apparent complications Patient did tolerate procedure well. Bilateral Breath Sounds: Diminished   Yes   Pt extubated to 2L N/C.  No stridor noted.  RN @ bedside.  Donnetta Hail 04/04/2019, 12:09 PM

## 2019-04-04 NOTE — Telephone Encounter (Signed)
   TELEPHONE CALL NOTE  This patient has been deemed a candidate for follow-up tele-health visit to limit community exposure during the Covid-19 pandemic.   Please schedule telehealth visit with Dr. Marlou Porch or an APP in 9-10 weeks following zio patch.  The patient will receive a phone call 2-3 days prior to their E-Visit at which time consent will be verbally confirmed. A Virtual Office Visit appointment type has been requested, please add "VIDEO" or "TELEPHONE" in the appointment notes.   Tami Lin [place], [place] 04/04/2019 11:04 AM

## 2019-04-04 NOTE — Progress Notes (Signed)
Dr. Sallyanne Kuster requests a 14-day zio for this patient - not to be placed before 30 days from today's date.  Reading physician: Dr. Marlou Porch

## 2019-04-04 NOTE — Progress Notes (Signed)
NAME:  Joshua Whitaker, MRN:  [id number], DOB:  [birth year], LOS: 5 ADMISSION DATE:  03/30/2019, CONSULTATION DATE:  04/01/2019 REFERRING MD:  Dr. Carles Collet, CHIEF COMPLAINT:  Respiratory failure  Brief History   7 yoM transfer from [place].  Admitted 4/6 with 2 day hx of cough and SOB.  Recent admit for AECOPD.  Developed respiratory failure 4/7 requiring intubation.  COVID-19 r/o.  Hospitalization complicated by atrial flutter with RVR and elevated troponin's which were flat in which cardiology has been consulted.   Past Medical History  COPD, CAD, ETOH/ tobacco abuse, HTN, HLD, chronic hyponatremia  Significant Hospital Events   3/20 to 3/23 for AECOPD and severe hyponatremia 4/6 Admitted to [facility] 4/8 Intubated/ tx to [facility]  Consults:  Cards - [facility]  PMT 4/8  Procedures:  4/8 ETT >>  Significant Diagnostic Tests:   Micro Data:  4/6 MRSA PCR >> neg 4/8 RVP >> neg  4/8 COVID NAA >> pending  4/8 trach asp >> GPC,. GPRs   Antimicrobials:  4/6 levaquin x 1 4/7 vanc >> 4/8 4/7 cefepime >> 4/9 Ceftriaxone 4/9 >> Azithro 4/9 >>  Interim history/subjective:  Intubated, sedated, on vent, comfortable awake to voice, tolerating sbt   Objective   Blood pressure (!) 155/61, pulse 79, temperature 99.1 F (37.3 C), resp. rate 16, height 5\' 10"  (1.778 m), weight 60.3 kg, SpO2 98 %.    Vent Mode: CPAP;PSV FiO2 (%):  [30 %-40 %] 40 % Set Rate:  [14 bmp] 14 bmp Vt Set:  [600 mL] 600 mL PEEP:  [5 cmH20] 5 cmH20 Pressure Support:  [5 cmH20] 5 cmH20 Plateau Pressure:  [11 cmH20-14 cmH20] 14 cmH20   Intake/Output Summary (Last 24 hours) at 04/04/2019 1008 Last data filed at 04/04/2019 0900 Gross per 24 hour  Intake 1042 ml  Output 1400 ml  Net -358 ml   Filed Weights   04/01/19 0600 04/02/19 0500 04/03/19 0500  Weight: 61.5 kg 61.7 kg 60.3 kg    Examination:. Gen:      Elderly, chronically ill appearing male  HEENT:  NCAT, sclera clear  Neck:     No mass, thin, trachea midline  Lungs:    BL vented breath sounds  CV:        RRR, S1 S2 Abd:    BS+ soft, nd nt,   Ext:   No edema Skin:      Cool bl le  Neuro:   Awake, alert, following commands, on vent, no distress   Resolved Hospital Problem list    Assessment & Plan:   Acute on chronic hypoxic respiratory failure, intubated on mechanical ventilation  AECOPD Right sided pneumonia  - CXR reviewed - LUL infilrate, The patient's images have been independently reviewed by me.   P:  Remains on vent, tolerated sbt Plans for extubation today  Would keep in unit today for obs post extubation  5 days abx  pred taper  Change nebs to inhaler regimen   ETOH abuse  P:  MVI thiame folate and CIWA with prn ativan   Atrial flutter/fib  P:  Resume apixiban post extubation   Elevated troponin - no EKG changes - prior workup 3/20 with no wall motion abnormalities  P:  Hold asa per card Continue plavix and apixiban   Positive D-dimer  - albeit lower, is positive  - overall, going back on home ac   HLD P:  On statin  Best practice:  Diet: NPO. Start tybe feeds if not extubated  today Pain/Anxiety/Delirium protocol (if indicated): CIWA, prn fentanyl VAP protocol (if indicated): yes DVT prophylaxis: SCDs, Loevenox GI prophylaxis: PPI Glucose control: trend on BMP Mobility: BR Code Status: Full, PMT consulted for [place] Family Communication: no family at bedside.  Disposition: ICU  Labs   CBC: Recent Labs  Lab 03/30/19 1646 03/31/19 0413 04/01/19 0417 04/01/19 1608 04/02/19 0353 04/04/19 0608  WBC 21.4* 14.6* 11.5*  --  12.3* 15.0*  NEUTROABS  --   --  10.3*  --   --   --   HGB 12.3* 11.3* 11.4* 10.2* 10.2* 12.0*  HCT 38.3* 35.8* 34.9* 30.0* 32.4* 37.4*  MCV 95.0 95.0 93.1  --  92.6 93.7  PLT 350 324 316  --  277 962    Basic Metabolic Panel: Recent Labs  Lab 03/30/19 1646 03/31/19 0413 04/01/19 0417 04/01/19 1608 04/02/19 0353 04/03/19 1820 04/04/19 0838  NA 140 137 139 139 139  --  145  K  4.5 4.6 4.5 4.6 4.2  --  3.3*  CL 105 105 107  --  107  --  108  CO2 24 23 24   --  23  --  25  GLUCOSE 91 130* 131*  --  132*  --  136*  BUN 31* 27* 40*  --  41*  --  33*  CREATININE 0.83 0.68 0.82  --  0.96  --  0.72  CALCIUM 8.5* 8.0* 8.2*  --  8.3*  --  8.6*  MG  --   --   --   --  2.5* 2.4 2.2  PHOS  --   --   --   --  3.1 2.7 1.7*   GFR: Estimated Creatinine Clearance: 68 mL/min (by C-G formula based on SCr of 0.72 mg/dL). Recent Labs  Lab 03/31/19 0413 04/01/19 0417 04/01/19 1329 04/02/19 0353 04/03/19 0324 04/04/19 0608  PROCALCITON  --   --  <0.10 <0.10 <0.10  --   WBC 14.6* 11.5*  --  12.3*  --  15.0*    Liver Function Tests: Recent Labs  Lab 03/30/19 1657 03/31/19 0413 04/04/19 0838  AST 74* 47* 62*  ALT 92* 75* 123*  ALKPHOS 117 97 69  BILITOT 0.3 0.3 0.5  PROT 6.3* 5.7* 5.3*  ALBUMIN 3.0* 2.5* 2.4*   No results for input(s): LIPASE, AMYLASE in the last 168 hours. No results for input(s): AMMONIA in the last 168 hours.  ABG    Component Value Date/Time   PHART 7.361 04/01/2019 1608   PCO2ART 46.9 04/01/2019 1608   PO2ART 173.0 (H) 04/01/2019 1608   HCO3 26.6 04/01/2019 1608   TCO2 28 04/01/2019 1608   O2SAT 99.0 04/01/2019 1608     Coagulation Profile: No results for input(s): INR, PROTIME in the last 168 hours.  Cardiac Enzymes: Recent Labs  Lab 03/30/19 1646 03/30/19 1833 03/30/19 2251 03/31/19 0413 04/01/19 1329  CKTOTAL  --   --   --   --  40*  TROPONINI 1.12* 1.14* 1.45* 1.36*  --     HbA1C: Hgb A1c MFr Bld  Date/Time Value Ref Range Status  03/14/2019 04:17 AM 5.1 4.8 - 5.6 % Final    Comment:    (NOTE) Pre diabetes:          5.7%-6.4% Diabetes:              >6.4% Glycemic control for   <7.0% adults with diabetes     CBG: Recent Labs  Lab 04/03/19 1326 04/03/19 2048 04/03/19  2259 04/04/19 0444 04/04/19 0836  GLUCAP 102* 97 129* 120* 71    This patient is critically ill with multiple organ system failure; which,  requires frequent high complexity decision making, assessment, support, evaluation, and titration of therapies. This was completed through the application of advanced monitoring technologies and extensive interpretation of multiple databases. During this encounter critical care time was devoted to patient care services described in this note for 34 minutes.   Garner Nash, DO [facility] 04/04/2019 10:08 AM  Personal pager: [phone] If unanswered, please page CCM On-call: [phone]

## 2019-04-04 NOTE — Progress Notes (Signed)
  Remains in sinus rhythm, occasional PVCs  [facility] will sign off.   Medication Recommendations:   - Restart apixaban when taking PO and continue for at least the next 6 weeks..  - Stop ASA when apixaban started. Continue clopidogrel. - However, reviewing his PCP notes, alcoholism is an issue and he may not be the best candidate for long term anticoagulation. Other recommendations (labs, testing, etc):   - Reevaluate with outpatient 14-day arrhythmia monitor after full recovery from current respiratory illness (no sooner than 30 days from now). If no recurrent atrial flutter/fibrillation, stop apixaban at that time. - limited echo for pericardial effusion in 1 month Follow up as an outpatient:   - Will arrange for OP event monitor followed by E-visit  Sanda Klein, MD, [facility] [phone] office [phone] pager

## 2019-04-04 NOTE — Discharge Instructions (Addendum)
YOUR CARDIOLOGY TEAM HAS ARRANGED FOR AN E-VISIT FOR YOUR APPOINTMENT - PLEASE REVIEW IMPORTANT INFORMATION BELOW SEVERAL DAYS PRIOR TO YOUR APPOINTMENT  Due to the recent COVID-19 pandemic, we are transitioning in-person office visits to tele-medicine visits in an effort to decrease unnecessary exposure to our patients and staff. Medicare and most insurances are covering these visits without a copay needed. We also encourage you to sign up for MyChart if you have not already done so. You will need a smartphone if possible. For patients that do not have this, we can still complete the visit using a regular telephone but do prefer a smartphone to enable video when possible. You may have a close family member that lives with you that can help. If possible, we also ask that you have a blood pressure cuff and scale at home to measure your blood pressure, heart rate and weight prior to your scheduled appointment. Patients with clinical needs that need an in-person evaluation and testing will still be able to come to the office if absolutely necessary. If you have any questions, feel free to call our office.    IF YOU HAVE A SMARTPHONE, PLEASE DOWNLOAD THE WEBEX APP TO YOUR SMARTPHONE  - If Apple, go to [employer] and type in WebEx in the search bar. [place] [employer], the blue/green circle. The app is free but as with any other app download, your phone may require you to verify saved payment information or Apple password. You do NOT have to create a WebEx account.  - If Android, go to [employer] and type in [employer] in the search bar. [place] [employer], the blue/green circle. The app is free but as with any other app download, your phone may require you to verify saved payment information or Android password. You do NOT have to create a WebEx account.  It is very helpful to have this downloaded before your visit.    2-3 DAYS BEFORE YOUR APPOINTMENT  You will receive a  telephone call from one of our [place] team members - your caller ID may say "Unknown caller." If this is a video visit, we will confirm that you have been able to download the WebEx app. We will remind you check your blood pressure, heart rate and weight prior to your scheduled appointment. If you have an Apple Watch or Kardia, please upload any pertinent ECG strips the day before or morning of your appointment to [place]. Our staff will also make sure you have reviewed the consent and agree to move forward with your scheduled tele-health visit.     THE DAY OF YOUR APPOINTMENT  Approximately 15 minutes prior to your scheduled appointment, you will receive a telephone call from one of [place] team - your caller ID may say "Unknown caller."  Our staff will confirm medications, vital signs for the day and any symptoms you may be experiencing. Please have this information available prior to the time of visit start. It may also be helpful for you to have a pad of paper and pen handy for any instructions given during your visit. They will also walk you through joining the WebEx smartphone meeting if this is a video visit.    CONSENT FOR TELE-HEALTH VISIT - PLEASE REVIEW  I hereby voluntarily request, consent and authorize [facility] and its employed or contracted physicians, physician assistants, nurse practitioners or other licensed health care professionals (the Practitioner), to provide me with telemedicine health care services (the Services") as  deemed necessary by the treating Practitioner. I acknowledge and consent to receive the Services by the Practitioner via telemedicine. I understand that the telemedicine visit will involve communicating with the Practitioner through live audiovisual communication technology and the disclosure of certain medical information by electronic transmission. I acknowledge that I have been given the opportunity to request an in-person assessment or other available  alternative prior to the telemedicine visit and am voluntarily participating in the telemedicine visit.  I understand that I have the right to withhold or withdraw my consent to the use of telemedicine in the course of my care at any time, without affecting my right to future care or treatment, and that the Practitioner or I may terminate the telemedicine visit at any time. I understand that I have the right to inspect all information obtained and/or recorded in the course of the telemedicine visit and may receive copies of available information for a reasonable fee.  I understand that some of the potential risks of receiving the Services via telemedicine include:   Delay or interruption in medical evaluation due to technological equipment failure or disruption;  Information transmitted may not be sufficient (e.g. poor resolution of images) to allow for appropriate medical decision making by the Practitioner; and/or   In rare instances, security protocols could fail, causing a breach of personal health information.  Furthermore, I acknowledge that it is my responsibility to provide information about my medical history, conditions and care that is complete and accurate to the best of my ability. I acknowledge that Practitioner's advice, recommendations, and/or decision may be based on factors not within their control, such as incomplete or inaccurate data provided by me or distortions of diagnostic images or specimens that may result from electronic transmissions. I understand that the practice of medicine is not an exact science and that Practitioner makes no warranties or guarantees regarding treatment outcomes. I acknowledge that I will receive a copy of this consent concurrently upon execution via email to the email address I last provided but may also request a printed copy by calling the office of [place].    I understand that my insurance will be billed for this visit.   I have read or had  this consent read to me.  I understand the contents of this consent, which adequately explains the benefits and risks of the Services being provided via telemedicine.   I have been provided ample opportunity to ask questions regarding this consent and the Services and have had my questions answered to my satisfaction.  I give my informed consent for the services to be provided through the use of telemedicine in my medical care  By participating in this telemedicine visit I agree to the above.  __________________________________________________________________________________________________  Dr. Sallyanne Kuster has requested that you wear a heart monitor to be mailed to your home with instructions. This will occur after 30 days from today when you have fully recovered from your hospitalization.   Following the 14-day monitor, we will schedule you an telehealth visit by phone with Dr. Marlou Porch.  Please answer phone calls from the heart monitor company, our offices, and from restricted numbers, as many of our staff are now working from home. --------------------------------------  Information on my medicine - ELIQUIS (apixaban)  Why was Eliquis prescribed for you? Eliquis was prescribed for you to reduce the risk of a blood clot forming that can cause a stroke if you have a medical condition called atrial fibrillation (a type of irregular heartbeat).  What  do You need to know about Eliquis ? Take your Eliquis TWICE DAILY - one tablet in the morning and one tablet in the evening with or without food. If you have difficulty swallowing the tablet whole please discuss with your pharmacist how to take the medication safely.  Take Eliquis exactly as prescribed by your doctor and DO NOT stop taking Eliquis without talking to the doctor who prescribed the medication.  Stopping may increase your risk of developing a stroke.  Refill your prescription before you run out.  After discharge, you should  have regular check-up appointments with your healthcare provider that is prescribing your Eliquis.  In the future your dose may need to be changed if your kidney function or weight changes by a significant amount or as you get older.  What do you do if you miss a dose? If you miss a dose, take it as soon as you remember on the same day and resume taking twice daily.  Do not take more than one dose of ELIQUIS at the same time to make up a missed dose.  Important Safety Information A possible side effect of Eliquis is bleeding. You should call your healthcare provider right away if you experience any of the following: ? Bleeding from an injury or your nose that does not stop. ? Unusual colored urine (red or dark brown) or unusual colored stools (red or black). ? Unusual bruising for unknown reasons. ? A serious fall or if you hit your head (even if there is no bleeding).  Some medicines may interact with Eliquis and might increase your risk of bleeding or clotting while on Eliquis. To help avoid this, consult your healthcare provider or pharmacist prior to using any new prescription or non-prescription medications, including herbals, vitamins, non-steroidal anti-inflammatory drugs (NSAIDs) and supplements.  This website has more information on Eliquis (apixaban): http://www.eliquis.com/eliquis/home

## 2019-04-04 NOTE — Progress Notes (Addendum)
Nutrition Brief Note  RD had been consulted for TF. Per chart, pt has been successfully extubated. TF not warranted at this time.   Will d/c consult and TF orders as well.  If nutrition issues arise, please consult RD.   Burtis Junes RD, LDN, CNSC Clinical Nutrition Available Tues-Sat via Pager: [phone] 04/04/2019 12:22 PM

## 2019-04-04 NOTE — Progress Notes (Signed)
RN called Pts daughter and updated her on pts POC for the evening.  Clint Bolder, RN 04/04/19 09:30 PM

## 2019-04-05 ENCOUNTER — Inpatient Hospital Stay (HOSPITAL_COMMUNITY): Payer: Medicare Other

## 2019-04-05 DIAGNOSIS — R627 Adult failure to thrive: Secondary | ICD-10-CM

## 2019-04-05 DIAGNOSIS — R64 Cachexia: Secondary | ICD-10-CM

## 2019-04-05 LAB — MAGNESIUM
Magnesium: 1.8 mg/dL (ref 1.7–2.4)
Magnesium: 1.6 mg/dL — ABNORMAL LOW (ref 1.7–2.4)

## 2019-04-05 LAB — PHOSPHORUS
Phosphorus: 1.7 mg/dL — ABNORMAL LOW (ref 2.5–4.6)
Phosphorus: 3.8 mg/dL (ref 2.5–4.6)

## 2019-04-05 LAB — GLUCOSE, CAPILLARY
Glucose-Capillary: 137 mg/dL — ABNORMAL HIGH (ref 70–99)
Glucose-Capillary: 60 mg/dL — ABNORMAL LOW (ref 70–99)
Glucose-Capillary: 61 mg/dL — ABNORMAL LOW (ref 70–99)
Glucose-Capillary: 61 mg/dL — ABNORMAL LOW (ref 70–99)
Glucose-Capillary: 91 mg/dL (ref 70–99)
Glucose-Capillary: 66 mg/dL — ABNORMAL LOW (ref 70–99)
Glucose-Capillary: 105 mg/dL — ABNORMAL HIGH (ref 70–99)
Glucose-Capillary: 85 mg/dL (ref 70–99)

## 2019-04-05 LAB — HEPARIN LEVEL (UNFRACTIONATED): Heparin Unfractionated: 0.11 IU/mL — ABNORMAL LOW (ref 0.30–0.70)

## 2019-04-05 MED ORDER — VITAL HIGH PROTEIN PO LIQD
1000.0000 mL | ORAL | Status: DC
  Administered 2019-04-06: 1000 mL
  Filled 2019-04-05: qty 1000

## 2019-04-05 MED ORDER — CHLORHEXIDINE GLUCONATE 0.12 % MT SOLN
15.0000 mL | Freq: Two times a day (BID) | OROMUCOSAL | Status: DC
  Administered 2019-04-05 – 2019-04-10 (×12): 15 mL via OROMUCOSAL
  Filled 2019-04-05 (×9): qty 15

## 2019-04-05 MED ORDER — METOPROLOL TARTRATE 5 MG/5ML IV SOLN
5.0000 mg | Freq: Four times a day (QID) | INTRAVENOUS | Status: DC
  Administered 2019-04-05 – 2019-04-10 (×19): 5 mg via INTRAVENOUS
  Filled 2019-04-05 (×20): qty 5

## 2019-04-05 MED ORDER — DEXTROSE 50 % IV SOLN: 25.0000 mL | Freq: Once | INTRAVENOUS | Status: AC

## 2019-04-05 MED ORDER — METHYLPREDNISOLONE SODIUM SUCC 40 MG IJ SOLR
20.0000 mg | Freq: Every day | INTRAMUSCULAR | Status: AC
  Administered 2019-04-05 – 2019-04-08 (×4): 20 mg via INTRAVENOUS
  Filled 2019-04-05: qty 0.5
  Filled 2019-04-05 (×3): qty 1

## 2019-04-05 MED ORDER — POTASSIUM PHOSPHATES 15 MMOLE/5ML IV SOLN
30.0000 mmol | Freq: Once | INTRAVENOUS | Status: AC
  Administered 2019-04-05: 30 mmol via INTRAVENOUS
  Filled 2019-04-05: qty 10

## 2019-04-05 MED ORDER — DEXTROSE 50 % IV SOLN
INTRAVENOUS | Status: AC
  Administered 2019-04-05: 50 mL
  Filled 2019-04-05: qty 50

## 2019-04-05 MED ORDER — ORAL CARE MOUTH RINSE
15.0000 mL | Freq: Two times a day (BID) | OROMUCOSAL | Status: DC
  Administered 2019-04-05 – 2019-04-10 (×10): 15 mL via OROMUCOSAL

## 2019-04-05 MED ORDER — HEPARIN (PORCINE) 25000 UT/250ML-% IV SOLN
950.0000 [IU]/h | INTRAVENOUS | Status: DC
  Administered 2019-04-05: 800 [IU]/h via INTRAVENOUS
  Filled 2019-04-05: qty 250

## 2019-04-05 MED ORDER — SODIUM CHLORIDE 0.9 % IV SOLN
3.0000 g | Freq: Three times a day (TID) | INTRAVENOUS | Status: AC
  Administered 2019-04-05 – 2019-04-09 (×12): 3 g via INTRAVENOUS
  Filled 2019-04-05 (×15): qty 3

## 2019-04-05 MED ORDER — DEXTROSE 50 % IV SOLN
12.5000 g | INTRAVENOUS | Status: AC
  Administered 2019-04-05: 12.5 g via INTRAVENOUS

## 2019-04-05 MED ORDER — PRO-STAT SUGAR FREE PO LIQD
30.0000 mL | Freq: Two times a day (BID) | ORAL | Status: DC
  Administered 2019-04-06 (×2): 30 mL
  Filled 2019-04-05 (×2): qty 30

## 2019-04-05 NOTE — Progress Notes (Addendum)
NAME:  Joshua Whitaker, MRN:  [id number], DOB:  04/15/1941, LOS: 6 ADMISSION DATE:  03/30/2019, CONSULTATION DATE:  04/01/2019 REFERRING MD:  Dr. Carles Collet, CHIEF COMPLAINT:  Respiratory failure  Brief History   58 yoM transfer from [place].  Admitted 4/6 with 2 day hx of cough and SOB.  Recent admit for AECOPD.  Developed respiratory failure 4/7 requiring intubation.  COVID-19 r/o.  Hospitalization complicated by atrial flutter with RVR and elevated troponin's which were flat in which cardiology has been consulted.   Past Medical History  COPD, CAD, ETOH/ tobacco abuse, HTN, HLD, chronic hyponatremia  Significant Hospital Events   3/20 to 3/23 for AECOPD and severe hyponatremia 4/6 Admitted to [facility] 4/8 Intubated/ tx to [facility]  Consults:  Cards - [facility]  PMT 4/8  Procedures:  4/8 ETT >>  Significant Diagnostic Tests:   Micro Data:  4/6 MRSA PCR >> neg 4/8 RVP >> neg  4/8 COVID NAA >> pending  4/8 trach asp >> GPC,. GPRs   Antimicrobials:  4/6 levaquin x 1 4/7 vanc >> 4/8 4/7 cefepime >> 4/9 Ceftriaxone 4/9 >> Azithro 4/9 >>  Interim history/subjective:  Awake alert, following commands, watching tv. States that he is feeling better. Understands that he is having trouble swallowing   Objective   Blood pressure (!) 143/67, pulse 67, temperature 98.8 F (37.1 C), resp. rate 11, height 5\' 10"  (1.778 m), weight 60.3 kg, SpO2 100 %.        Intake/Output Summary (Last 24 hours) at 04/05/2019 1004 Last data filed at 04/05/2019 0800 Gross per 24 hour  Intake 656.66 ml  Output 3600 ml  Net -2943.34 ml   Filed Weights   04/01/19 0600 04/02/19 0500 04/03/19 0500  Weight: 61.5 kg 61.7 kg 60.3 kg    Examination:. Gen:      Elderly, chronically ill HEENT: NCAT, sclera clear  Neck:     Thin, trachea midline  Lungs:   diminshed breath sounds bilaterally, no wheeze  CV:        Rrr, s1 s2, no mrg  Abd:    bs+, soft nt nd  Ext:  no edema  Skin:     Cool bl LE  Neuro:   Awake alert,  following commands, no focal deficit   Resolved Hospital Problem list    Assessment & Plan:   Acute on chronic hypoxic respiratory failure, intubated on mechanical ventilation  AECOPD Right sided pneumonia  - CXR reviewed - LUL infiltrate, stable - The patient's images have been independently reviewed by me.  Aspirating, not tolerating PO  Likely component of aspiration pneumonia    P:  Extubated yesterday Stable  We will plan to move out of ICU  Continue inhalers  Appreciate palliative input  SLP pending once covid  Npo at this time  Appreciate palliative care input   ETOH abuse  P:  MVI, thiamine, folate CIWA  Atrial flutter/fib  P:  Cant restart d/t unable to take po At this point will start heparin    Elevated troponin - no EKG changes - prior workup 3/20 with no wall motion abnormalities  P:  Hold asa per cards Continue apixiban and plavix once taking po  Positive D-dimer   HLD P:  On statin   Stable for transfer from the ICU. I have discussed with [facility] Dr. Lonn Georgia who has agree to pick the patient up on 04/06/2019.  Best practice:  Diet: NPO. Start tybe feeds if not extubated today Pain/Anxiety/Delirium protocol (if indicated):  CIWA, prn fentanyl VAP protocol (if indicated): yes DVT prophylaxis: SCDs, Loevenox GI prophylaxis: PPI Glucose control: trend on BMP Mobility: BR Code Status: Full, PMT consulted for [place] Family Communication: no family at bedside.  Disposition: ICU  Labs   CBC: Recent Labs  Lab 03/30/19 1646 03/31/19 0413 04/01/19 0417 04/01/19 1608 04/02/19 0353 04/04/19 0608  WBC 21.4* 14.6* 11.5*  --  12.3* 15.0*  NEUTROABS  --   --  10.3*  --   --   --   HGB 12.3* 11.3* 11.4* 10.2* 10.2* 12.0*  HCT 38.3* 35.8* 34.9* 30.0* 32.4* 37.4*  MCV 95.0 95.0 93.1  --  92.6 93.7  PLT 350 324 316  --  277 427    Basic Metabolic Panel: Recent Labs  Lab 03/30/19 1646 03/31/19 0413 04/01/19 0417 04/01/19 1608 04/02/19 0353 04/03/19  1820 04/04/19 0838 04/04/19 1638 04/05/19 0525  NA 140 137 139 139 139  --  145  --   --   K 4.5 4.6 4.5 4.6 4.2  --  3.3*  --   --   CL 105 105 107  --  107  --  108  --   --   CO2 24 23 24   --  23  --  25  --   --   GLUCOSE 91 130* 131*  --  132*  --  136*  --   --   BUN 31* 27* 40*  --  41*  --  33*  --   --   CREATININE 0.83 0.68 0.82  --  0.96  --  0.72  --   --   CALCIUM 8.5* 8.0* 8.2*  --  8.3*  --  8.6*  --   --   MG  --   --   --   --  2.5* 2.4 2.2 2.2 1.8  PHOS  --   --   --   --  3.1 2.7 1.7* 2.2* 1.7*   GFR: Estimated Creatinine Clearance: 68 mL/min (by C-G formula based on SCr of 0.72 mg/dL). Recent Labs  Lab 03/31/19 0413 04/01/19 0417 04/01/19 1329 04/02/19 0353 04/03/19 0324 04/04/19 0608  PROCALCITON  --   --  <0.10 <0.10 <0.10  --   WBC 14.6* 11.5*  --  12.3*  --  15.0*    Liver Function Tests: Recent Labs  Lab 03/30/19 1657 03/31/19 0413 04/04/19 0838  AST 74* 47* 62*  ALT 92* 75* 123*  ALKPHOS 117 97 69  BILITOT 0.3 0.3 0.5  PROT 6.3* 5.7* 5.3*  ALBUMIN 3.0* 2.5* 2.4*   No results for input(s): LIPASE, AMYLASE in the last 168 hours. No results for input(s): AMMONIA in the last 168 hours.  ABG    Component Value Date/Time   PHART 7.361 04/01/2019 1608   PCO2ART 46.9 04/01/2019 1608   PO2ART 173.0 (H) 04/01/2019 1608   HCO3 26.6 04/01/2019 1608   TCO2 28 04/01/2019 1608   O2SAT 99.0 04/01/2019 1608     Coagulation Profile: No results for input(s): INR, PROTIME in the last 168 hours.  Cardiac Enzymes: Recent Labs  Lab 03/30/19 1646 03/30/19 1833 03/30/19 2251 03/31/19 0413 04/01/19 1329  CKTOTAL  --   --   --   --  40*  TROPONINI 1.12* 1.14* 1.45* 1.36*  --     HbA1C: Hgb A1c MFr Bld  Date/Time Value Ref Range Status  03/14/2019 04:17 AM 5.1 4.8 - 5.6 % Final    Comment:    (  NOTE) Pre diabetes:          5.7%-6.4% Diabetes:              >6.4% Glycemic control for   <7.0% adults with diabetes     CBG: Recent Labs   Lab 04/05/19 0630 04/05/19 0643 04/05/19 0644 04/05/19 0658 04/05/19 0806  GLUCAP 61* 61* 60* 137* 91     Garner Nash, DO [facility] 04/05/2019 10:05 AM  Personal pager: [phone] If unanswered, please page CCM On-call: [phone]

## 2019-04-05 NOTE — Progress Notes (Signed)
ANTICOAGULATION CONSULT NOTE - Initial Consult  Pharmacy Consult for heparin Indication: atrial fibrillation  Allergies  Allergen Reactions  . Tiotropium Bromide Monohydrate Other (See Comments)    Severe urinary retention  . Varenicline Other (See Comments)    Psychiatric and dreams  . Iodinated Diagnostic Agents   . Statins     Patient Measurements: Height: 5\' 10"  (177.8 cm) Weight: 132 lb 15 oz (60.3 kg) IBW/kg (Calculated) : 73  Vital Signs: Temp: 99 F (37.2 C) (04/12 1000) Temp Source: Oral (04/12 0015) BP: 147/70 (04/12 1000) Pulse Rate: 73 (04/12 1000)  Labs: Recent Labs    04/04/19 0608 04/04/19 0838  HGB 12.0*  --   HCT 37.4*  --   PLT 276  --   CREATININE  --  0.72    Estimated Creatinine Clearance: 68 mL/min (by C-G formula based on SCr of 0.72 mg/dL).  Assessment: CC/HPI: shortness of breath; covid r/o  PMH: COPD coronary artery disease, alcohol and tobacco abuse, hyperlipidemia, hypertension and chronic hyponatremia   Anticoag: New onset afib and started on apixaban 5mg  at [facility] (age <80, wt >60, Scr 0.96); CHADSVAS 3 Aspirating>>hep gtt 4/12  Heme/Onc: H&H 12/37.4, Plt 276 Ddimer 3.72  Goal of Therapy:  Heparin level 0.3-0.7 units/ml Monitor platelets by anticoagulation protocol: Yes   Plan:  DC apixaban Heparin 800 units/hr Initial HL 1800 Daily HL CBC F/U plans to return to apixaban  Levester Fresh, PharmD, BCPS, BCCCP Clinical Pharmacist [phone]  Please check AMION for all [place] numbers  04/05/2019 10:31 AM

## 2019-04-05 NOTE — Progress Notes (Signed)
Hypoglycemic Event  CBG: 60  Treatment: D50 25 mL (12.5 gm)  Symptoms: None  Follow-up CBG: [id number] CBG Result: 137  Possible Reasons for Event: Inadequate meal intake      Clint Bolder

## 2019-04-05 NOTE — Progress Notes (Signed)
Hypoglycemic Event  CBG: 61  Treatment: D50 25 mL (12.5 gm)  Symptoms: None  Follow-up CBG: [id number] CBG Result:60  Possible Reasons for Event: Inadequate meal intake      Clint Bolder

## 2019-04-05 NOTE — Progress Notes (Signed)
Patient ID: Joshua Whitaker, male   DOB: 19-Nov-1941, 78 y.o.   MRN: [id number]  This NP visited patient at the bedside as a follow up to last week's  GOCs meeting.  Patient is now extubated for 24 hours. He remains weak and speech is garbled and low in volume.  Att his time he is unable to participate in a  conversation regarding his GOCs and plan of care.  Called placed to daughter/Angela/main contact person.  Continued conversation regarding current medcal situation, overall prognosis and  treatment options.  Emotional support offered, we discussed the concept of human mortality and overall failure to thrive and the limitations of medical interventions to prolong quality of life when a body begins to  f ail to thrive.  Family remains hopeful for continued Improvement and hope for [name] to return to his baseline.   They are open to all offered and available medical interventions to prolong life.  They are agreement with reintubation if necessary and are open to artifical feeding.  Discussed with family  the importance of continued conversation with each other  and the  medical providers regarding overall plan of care and treatment options,  ensuring decisions are within the context of the patients values and GOCs.  Questions and concerns addressed   Discussed with Robin RN  Total time spent on the unit was 45 minute     PMT will f/u in the morning.  Greater than 50% of the time was spent in counseling and coordination of care  Wadie Lessen NP  Palliative Medicine Team Team Phone # [phone] Pager [phone]

## 2019-04-05 NOTE — Evaluation (Addendum)
Clinical/Bedside Swallow Evaluation Patient Details  Name: Joshua Whitaker MRN: [id number] Date of Birth: 05-11-1941  Today's Date: 04/05/2019 Time: SLP Start Time (ACUTE ONLY): 1055 SLP Stop Time (ACUTE ONLY): 1111 SLP Time Calculation (min) (ACUTE ONLY): 16 min  Past Medical History:  Past Medical History:  Diagnosis Date  . Alcohol abuse   . Allergy   . Anxiety   . Asthma   . COPD (chronic obstructive pulmonary disease) ([place])   . Coronary artery calcification seen on CT scan   . Depression   . Hyponatremia   . Prostate cancer [facility])    Past Surgical History:  Past Surgical History:  Procedure Laterality Date  . EYE SURGERY    . PROSTATE SURGERY    . SPINE SURGERY     HPI:  78 y.o. male with prior history of COPD, CAD, ETOH/ tobacco abuse, HTN, HLD, chronic hyponatremia presented to [facility] on 4/6 with 2 day history of shortness of breath and cough. Recent hospitalization for acute exacerbation of COPD and severe hyponatremia (3/20-3/23/20). Desaturation requiring BiPAP and was ultimately intubated 4/8-4/11/20. CXR showed L greater than R patchy densities. COVID-19 pending.   Assessment / Plan / Recommendation Clinical Impression   Patient presents with clinical signs of aspiration with thin liquids and puree. He appears weak, deconditioned, and is unable to support a cup without assistance. Vocal intensity is low, barely audible. Pt's respirations and work of breathing increased after single cup sip of thin liquid, suggestive of decreased airway protection. Assessed pt with puree to determine if thicker consistency appeared to improve airway protection clinically, however this resulted in immediate, weak cough, concerning for aspiration. Do not advise initiating POs at this time due to high risk for aspiration. While pt/daughter deny difficulties swallowing, pt's deconditioning and recent intubation likely contributing to deficits. SLP to follow for readiness for POs and would  consider instrumental assessment of swallow function (MBS), however unable to perform MBS until pt tests negative for COVID 19. For now, continue NPO, would allow a few ice chips with RN after oral care as long as respiratory status remains stable. Discussed with pt's daughter [name] via phone; she expressed that she communicated with palliative care today and is hopeful for placement of cortrak tube for temporary means of nutrition.   SLP Visit Diagnosis: Dysphagia, unspecified (R13.10)    Aspiration Risk  Severe aspiration risk    Diet Recommendation NPO;Ice chips PRN after oral care   Medication Administration: Via alternative means Supervision: Full supervision/cueing for compensatory strategies    Other  Recommendations Oral Care Recommendations: Oral care QID;Oral care prior to ice chip/H20 Other Recommendations: Prohibited food (jello, ice cream, thin soups);Remove water pitcher;Have oral suction available   Follow up Recommendations Other (comment)(tbd)      Frequency and Duration min 2x/week  2 weeks       Prognosis Prognosis for Safe Diet Advancement: Fair Barriers to Reach Goals: Other (Comment)(deconditioning, comorbidities)      Swallow Study   General Date of Onset: 03/30/19 HPI: 78 y.o. male with prior history of COPD, CAD, ETOH/ tobacco abuse, HTN, HLD, chronic hyponatremia presented to [facility] on 4/6 with 2 day history of shortness of breath and cough. Recent hospitalization for acute exacerbation of COPD and severe hyponatremia (3/20-3/23/20). Desaturation requiring BiPAP and was ultimately intubated 4/8-4/11/20. CXR showed L greater than R patchy densities. COVID-19 pending. Type of Study: Bedside Swallow Evaluation Previous Swallow Assessment: none in chart; some documentation of swallowing complaints ~2011, however pt/daughter deny recent  difficulties and no prior SLP notes in chart Diet Prior to this Study: NPO Temperature Spikes Noted: No Respiratory Status:  Nasal cannula(2L) History of Recent Intubation: Yes Length of Intubations (days): 4 days Date extubated: 04/04/19 Behavior/Cognition: Alert;Cooperative Oral Cavity Assessment: Within Functional Limits Oral Care Completed by SLP: Yes Oral Cavity - Dentition: Missing dentition;Poor condition Vision: Functional for self-feeding Self-Feeding Abilities: Needs assist(weak, deconditioned) Patient Positioning: Upright in bed Baseline Vocal Quality: Low vocal intensity Volitional Cough: Other (Comment)(did not test; spontaneous cough is weak) Volitional Swallow: Able to elicit    Oral/Motor/Sensory Function Overall Oral Motor/Sensory Function: Within functional limits   Ice Chips Ice chips: Within functional limits Presentation: Spoon   Thin Liquid Thin Liquid: Impaired Oral Phase Impairments: Reduced labial seal Oral Phase Functional Implications: Right anterior spillage;Left anterior spillage Pharyngeal  Phase Impairments: Suspected delayed Swallow;Change in Vital Signs    Nectar Thick Nectar Thick Liquid: Not tested   Honey Thick Honey Thick Liquid: Not tested   Puree Puree: Impaired Presentation: Spoon Pharyngeal Phase Impairments: Cough - Immediate   Solid     Solid: Not tested     Deneise Lever, MS, CCC-SLP Speech-Language Pathologist Acute Rehabilitation Services Pager: [phone] Office: [phone]  Aliene Altes 04/05/2019,11:33 AM

## 2019-04-06 ENCOUNTER — Inpatient Hospital Stay (HOSPITAL_COMMUNITY): Payer: Medicare Other

## 2019-04-06 DIAGNOSIS — J430 Unilateral pulmonary emphysema [MacLeod's syndrome]: Secondary | ICD-10-CM

## 2019-04-06 LAB — COMPREHENSIVE METABOLIC PANEL
ALT: 316 U/L — ABNORMAL HIGH (ref 0–44)
AST: 132 U/L — ABNORMAL HIGH (ref 15–41)
Albumin: 2.3 g/dL — ABNORMAL LOW (ref 3.5–5.0)
Alkaline Phosphatase: 67 U/L (ref 38–126)
Anion gap: 12 (ref 5–15)
BUN: 15 mg/dL (ref 8–23)
CO2: 28 mmol/L (ref 22–32)
Calcium: 7.9 mg/dL — ABNORMAL LOW (ref 8.9–10.3)
Chloride: 95 mmol/L — ABNORMAL LOW (ref 98–111)
Creatinine, Ser: 0.55 mg/dL — ABNORMAL LOW (ref 0.61–1.24)
GFR calc Af Amer: >60 mL/min (ref >60)
GFR calc non Af Amer: >60 mL/min (ref >60)
Glucose, Bld: 104 mg/dL — ABNORMAL HIGH (ref 70–99)
Potassium: 2.9 mmol/L — ABNORMAL LOW (ref 3.5–5.1)
Sodium: 135 mmol/L (ref 135–145)
Total Bilirubin: 0.6 mg/dL (ref 0.3–1.2)
Total Protein: 5.2 g/dL — ABNORMAL LOW (ref 6.5–8.1)

## 2019-04-06 LAB — GLUCOSE, CAPILLARY
Glucose-Capillary: 63 mg/dL — ABNORMAL LOW (ref 70–99)
Glucose-Capillary: 103 mg/dL — ABNORMAL HIGH (ref 70–99)
Glucose-Capillary: 99 mg/dL (ref 70–99)
Glucose-Capillary: 108 mg/dL — ABNORMAL HIGH (ref 70–99)
Glucose-Capillary: 92 mg/dL (ref 70–99)
Glucose-Capillary: 88 mg/dL (ref 70–99)

## 2019-04-06 LAB — MAGNESIUM
Magnesium: 1.6 mg/dL — ABNORMAL LOW (ref 1.7–2.4)
Magnesium: 2.3 mg/dL (ref 1.7–2.4)

## 2019-04-06 LAB — CBC
HCT: 38.1 % — ABNORMAL LOW (ref 39.0–52.0)
Hemoglobin: 12.9 g/dL — ABNORMAL LOW (ref 13.0–17.0)
MCH: 29.9 pg (ref 26.0–34.0)
MCHC: 33.9 g/dL (ref 30.0–36.0)
MCV: 88.2 fL (ref 80.0–100.0)
Platelets: 256 10*3/uL (ref 150–400)
RBC: 4.32 MIL/uL (ref 4.22–5.81)
RDW: 13.2 % (ref 11.5–15.5)
WBC: 12.6 10*3/uL — ABNORMAL HIGH (ref 4.0–10.5)
nRBC: 0 % (ref 0.0–0.2)

## 2019-04-06 LAB — HEPARIN LEVEL (UNFRACTIONATED): Heparin Unfractionated: 0.13 IU/mL — ABNORMAL LOW (ref 0.30–0.70)

## 2019-04-06 LAB — NOVEL CORONAVIRUS, NAA (HOSP ORDER, SEND-OUT TO REF LAB; TAT 18-24 HRS): SARS-CoV-2, NAA: NOT DETECTED

## 2019-04-06 LAB — PHOSPHORUS
Phosphorus: 2.5 mg/dL (ref 2.5–4.6)
Phosphorus: 1.9 mg/dL — ABNORMAL LOW (ref 2.5–4.6)

## 2019-04-06 MED ORDER — MAGNESIUM SULFATE 50 % IJ SOLN
4.0000 g | Freq: Once | INTRAVENOUS | Status: AC
  Administered 2019-04-06: 4 g via INTRAVENOUS
  Filled 2019-04-06: qty 8

## 2019-04-06 MED ORDER — POTASSIUM CHLORIDE 10 MEQ/100ML IV SOLN
10.0000 meq | INTRAVENOUS | Status: AC
  Administered 2019-04-06 (×4): 10 meq via INTRAVENOUS
  Filled 2019-04-06 (×3): qty 100

## 2019-04-06 MED ORDER — DEXTROSE-NACL 5-0.9 % IV SOLN
INTRAVENOUS | Status: DC
  Administered 2019-04-06: 22:00:00 via INTRAVENOUS

## 2019-04-06 MED ORDER — FREE WATER
200.0000 mL | Freq: Three times a day (TID) | Status: DC
  Administered 2019-04-06: 200 mL

## 2019-04-06 MED ORDER — JEVITY 1.2 CAL PO LIQD
1000.0000 mL | ORAL | Status: DC
  Filled 2019-04-06 (×2): qty 1000

## 2019-04-06 MED ORDER — ENOXAPARIN SODIUM 100 MG/ML ~~LOC~~ SOLN
1.5000 mg/kg | Freq: Once | SUBCUTANEOUS | Status: AC
  Administered 2019-04-06: 90 mg via SUBCUTANEOUS
  Filled 2019-04-06: qty 1

## 2019-04-06 MED ORDER — ENOXAPARIN SODIUM 100 MG/ML ~~LOC~~ SOLN
85.0000 mg | SUBCUTANEOUS | Status: DC
  Administered 2019-04-07 – 2019-04-10 (×4): 85 mg via SUBCUTANEOUS
  Filled 2019-04-06 (×4): qty 1

## 2019-04-06 MED ORDER — DEXTROSE 50 % IV SOLN
INTRAVENOUS | Status: AC
  Filled 2019-04-06: qty 50

## 2019-04-06 MED ORDER — DEXTROSE 50 % IV SOLN
12.5000 g | INTRAVENOUS | Status: AC
  Administered 2019-04-06: 12.5 g via INTRAVENOUS

## 2019-04-06 MED ORDER — DEXTROSE 5 % IV SOLN
INTRAVENOUS | Status: DC
  Administered 2019-04-06: 03:00:00 via INTRAVENOUS

## 2019-04-06 MED ORDER — LORAZEPAM 2 MG/ML IJ SOLN: 1.0000 mg | Freq: Four times a day (QID) | INTRAMUSCULAR | Status: AC | PRN

## 2019-04-06 MED ORDER — LORAZEPAM 1 MG PO TABS: 1.0000 mg | ORAL_TABLET | Freq: Four times a day (QID) | ORAL | Status: AC | PRN

## 2019-04-06 NOTE — Care Management Important Message (Signed)
Important Message  Patient Details  Name: Joshua Whitaker MRN: [id number] Date of Birth: 11-Aug-1941   Medicare Important Message Given:  Yes    Zenon Mayo, RN 04/06/2019, 3:18 PM

## 2019-04-06 NOTE — Progress Notes (Addendum)
Nutrition Consult/Follow Up  RD working remotely  Pound:   Not applicable  INTERVENTION:    Initiate Jevity 1.2 at 20 ml/hr and increase by 10 ml every 4 hours to goal rate of 60 ml/hr  Provides 1728 kcals, 80 gm protein, 1162 ml free water daily   D/C Vital High Protein & Prostat liquid protein  NUTRITION DIAGNOSIS:   Inadequate oral intake now related to dysphagia as evidenced by NPO status, ongoing  GOAL:   Patient will meet greater than or equal to 90% of their needs, progressing  MONITOR:   TF tolerance, Diet advancement, Labs, Skin, Weight trends, I & O's  ASSESSMENT:   Patient presents to [facility] from home with 2-days increased SOB / COPD exacerbation. Additional hx of HTN, alcohol and tobacco abuse. Transferred to [facility] MICU and intubated on 4/8. R/O COVID-19.  4/11 extubated 4/12 transferred out of MICU  RD re-consulted for assessment and TF management. S/p bedside swallow evaluation yesterday; SLP rec NPO status. NGT placed today at midnight; KUB showed tip in stomach.  Vital High Protein formula currently infusing at 40 ml/hr. Prostat liquid protein ordered at 30 ml BID via tube. Provides 1160 kcals and 114 gm protein.  Free water flushes at 200 ml every 8 hours. Labs & medications reviewed. K 2.9 (L). CBG's [id number].  RD to change TF regimen now that pt is out of ICU off vent support. Pt's weight trending down with negative fluid balance. Spoke with Bradly Chris, RN.  Diet Order:   Diet Order            Diet NPO time specified  Diet effective now             EDUCATION NEEDS:   Not appropriate for education at this time  Skin:  Skin Assessment: Reviewed RN Assessment  Last BM:  4/13    Intake/Output Summary (Last 24 hours) at 04/06/2019 1223 Last data filed at 04/06/2019 0500 Gross per 24 hour  Intake 835.94 ml  Output 2001 ml  Net -1165.06 ml   Height:   Ht Readings from Last 1 Encounters:  04/01/19 5\' 10"  (1.778 m)    Weight:   Wt Readings from Last 1 Encounters:  04/06/19 58.6 kg   Ideal Body Weight:  75 kg  BMI:  Body mass index is 18.54 kg/m.  Estimated Nutritional Needs:   Kcal:  1600-1800  Protein:  80-95 gm  Fluid:  1.6-1.8 L  Arthur Holms, RD, LDN Pager #: [phone] After-Hours Pager #: [phone]

## 2019-04-06 NOTE — Progress Notes (Signed)
eLink Physician-Brief Progress Note Patient Name: LARENCE THONE DOB: 1941/12/22 MRN: [id number]   Date of Service  04/06/2019  HPI/Events of Note  Asking for KUB, re placed NG tube.   eICU Interventions  KUB ordered. Call back if in place will order free water boluses.      Intervention Category Intermediate Interventions: Diagnostic test evaluation  Elmer Sow 04/06/2019, 12:11 AM

## 2019-04-06 NOTE — Progress Notes (Signed)
[place] Progress Note Patient Name: Joshua Whitaker DOB: [birth year] MRN: [id number]   Date of Service  04/06/2019  HPI/Events of Note  KUB, low CBG  eICU Interventions  1. Push down NG by 6 cm down, in place. Free water 200 ml q8 hr x 2 days, sodium 145. 2. Start Dextrose at 50 ml/hr. CBG q2 hr.      Intervention Category Intermediate Interventions: Diagnostic test evaluation;Other:  Elmer Sow 04/06/2019, 2:09 AM

## 2019-04-06 NOTE — Progress Notes (Signed)
Patient is alert and confused and pulled N/G tube out, After talking to daughter she had expressed waiting to see how the speech evaluation result turn out due having a hard time placing the initial N/g tube secondary to Hitial hernia. Spoke with Dr. Dyann Kief and she agreed to order IV fluids until tomorrows reevaluation.

## 2019-04-06 NOTE — Progress Notes (Signed)
Called 2W to get report, spoke to charge nurse [place] who said pt was not ready yet and he would have nurse call, I advised I would go for my 30 min. He said he would let her know

## 2019-04-06 NOTE — Progress Notes (Addendum)
Upon reassessment to prepare patient fro [place] of Abdomen Patient had removed NG/Tube. Patient appeared to be puzzled about why he needed to keep the tube until speech swallowing  reevaluation.

## 2019-04-06 NOTE — Progress Notes (Signed)
SLP Cancellation Note  Patient Details Name: Joshua Whitaker MRN: [id number] DOB: November 20, 1941   Cancelled treatment:       Reason Eval/Treat Not Completed: Medical issues which prohibited therapy. Per RN, pt is NPO due to possible ultrasound of his abdomen. SLP observed pt in his room with voice persistently soft, cough congested. He does have NGT placed so he has an alternative route for nutrition/medications as he is medically able to resume them. Will f/u for readiness to try POs and likely instrumental testing now that COVID testing has returned negative.   Venita Sheffield Maricsa Sammons 04/06/2019, 3:39 PM  Pollyann Glen, M.A. [place] Acute Environmental education officer [phone] Office [phone]

## 2019-04-06 NOTE — Progress Notes (Addendum)
[facility]                                                                              Patient Demographics  Joshua Whitaker, is a 78 y.o. male, DOB - 1941/03/15, [id number]:[id number]  Admit date - 03/30/2019   Admitting Physician Oswald Hillock, MD  Outpatient Primary MD for the patient is [name], MD  Outpatient specialists:   LOS - 7  days   Medical records reviewed and are as summarized below:    Chief Complaint  Patient presents with   Shortness of Breath       Brief summary   Patient is a 78 year old male with a history of COPD, CAD, EtOH and tobacco abuse, hypertension, hyperlipidemia, chronic hyponatremia presented from [facility].  Patient was at admitted on 4/6 with 2-day history of cough and shortness of breath.  He was recently hospitalized 3/20-3/23 for acute COPD exacerbation and severe hyponatremia down to 109.  Patient's hyponatremia was thought to be due to beer potomania.  Patient was placed on bronchodilators and IV Solu-Medrol, on evening of 4/7, he desaturated to 70s, placed on BiPAP.  On 4/8, patient was taken off the BiPAP but experienced respiratory distress.  Patient was intubated and subsequently transferred to [facility].  Patient was also found to be in new onset atrial flutter with RVR, elevated troponin.  Cardiology was consulted.  He was initially placed on IV Cardizem, then transition to oral Cardizem. TRH service at [facility] assumed care on 4/13.  He was extubated on 04/04/2019  COVID-19 negative  Assessment & Plan    Principal Problem:   Acute on chronic respiratory failure with hypoxia (HCC) secondary to COPD exacerbation, HCAP with a component of aspiration pneumonia -Patient was intubated on 4/8, extubated on 4/11, currently stable -O2 sats 100% on 2 L via nasal cannula -Sputum culture negative, influenza negative, respiratory virus panel negative - COVID-19 negative -Patient currently on albuterol as needed nebs, Breo, IV Solu-Medrol 20 mg  daily for 4 days -Continue IV Unasyn -Failed SLP evaluation on 4/12, recommended n.p.o. due to severe aspiration risk.   - currently has NG tube with tube feeds at 60 cc/hour -Palliative medicine following  Active Problems:   Chronic obstructive pulmonary disease ([place]) -As #1, recent admission with COPD exacerbation in March 2020    Atrial flutter with RVR ([place]) -Currently rate controlled, last seen by cardiology on 4/11, signed off, recommended apixaban when patient is taking p.o. for at least 6 weeks, stop aspirin when apixaban is started, continue Plavix.  However reviewing PCP notes, alcoholism is an issue and may not be the best candidate for long-term anticoagulation -Recommended follow-up outpatient in 14 days for event monitor.  Cardiology will arrange a visit  Dysphagia with aspiration -Continue SLP evaluations, currently on Jevity 1.2 at 60 cc an hour  History of alcohol abuse Continue CIWA protocol with Ativan, folate Currently stable  Hypokalemia, hypomagnesemia Potassium 2.9, replaced IV  Elevated LFTs Obtain hepatitis panel, right upper quadrant ultrasound  Code Status: Full CODE STATUS DVT Prophylaxis:  Lovenox  Family Communication: Discussed in detail with the patient, all imaging results, lab results explained  to the patient    Disposition Plan: Transfer to noncovid med tele floor, needs SLP evaluation, PT OT for disposition Discussed in detail with Dr. Drucilla Schmidt, ID, okay to transfer to noncovid floor with no precautions needed. Likely patient symptoms were due to acute COPD exacerbation, was intubated for airway protection,  aspiration pneumonia  Time Spent in minutes 35 minutes Procedures:  Patient was intubated on 4/8, extubated on 4/11  Consultants:   Pulmonary critical care  Antimicrobials:   Anti-infectives (From admission, onward)   Start     Dose/Rate Route Frequency Ordered Stop   04/05/19 1100  Ampicillin-Sulbactam (UNASYN) 3 g in sodium  chloride 0.9 % 100 mL IVPB     3 g 200 mL/hr over 30 Minutes Intravenous Every 8 hours 04/05/19 1008 04/09/19 1059   04/04/19 1630  azithromycin (ZITHROMAX) 250 mg in dextrose 5 % 125 mL IVPB  Status:  Discontinued     250 mg 125 mL/hr over 60 Minutes Intravenous Every 24 hours 04/04/19 1616 04/05/19 1008   04/03/19 1700  azithromycin (ZITHROMAX) tablet 250 mg  Status:  Discontinued     250 mg Oral Daily 04/02/19 1115 04/04/19 1616   04/02/19 1700  ceFEPIme (MAXIPIME) 2 g in sodium chloride 0.9 % 100 mL IVPB  Status:  Discontinued     2 g 200 mL/hr over 30 Minutes Intravenous Every 12 hours 04/02/19 1058 04/02/19 1115   04/02/19 1700  cefTRIAXone (ROCEPHIN) 1 g in sodium chloride 0.9 % 100 mL IVPB  Status:  Discontinued     1 g 200 mL/hr over 30 Minutes Intravenous Every 24 hours 04/02/19 1115 04/05/19 1008   04/02/19 1700  azithromycin (ZITHROMAX) tablet 500 mg     500 mg Per Tube Daily 04/02/19 1115 04/02/19 1751   04/01/19 2300  vancomycin (VANCOCIN) 1,250 mg in sodium chloride 0.9 % 250 mL IVPB  Status:  Discontinued     1,250 mg 166.7 mL/hr over 90 Minutes Intravenous Every 24 hours 03/31/19 2227 04/01/19 1052   04/01/19 2200  ceFEPIme (MAXIPIME) 2 g in sodium chloride 0.9 % 100 mL IVPB  Status:  Discontinued     2 g 200 mL/hr over 30 Minutes Intravenous Every 8 hours 04/01/19 1558 04/02/19 1058   03/31/19 2300  ceFEPIme (MAXIPIME) 2 g in sodium chloride 0.9 % 100 mL IVPB  Status:  Discontinued     2 g 200 mL/hr over 30 Minutes Intravenous Every 8 hours 03/31/19 2220 04/01/19 1558   03/31/19 2300  vancomycin (VANCOCIN) 1,250 mg in sodium chloride 0.9 % 250 mL IVPB     1,250 mg 166.7 mL/hr over 90 Minutes Intravenous  Once 03/31/19 2221 04/01/19 0130   03/31/19 1800  levofloxacin (LEVAQUIN) IVPB 750 mg  Status:  Discontinued     750 mg 100 mL/hr over 90 Minutes Intravenous Every 24 hours 03/30/19 2222 03/31/19 0841   03/30/19 1930  levofloxacin (LEVAQUIN) IVPB 750 mg     750  mg 100 mL/hr over 90 Minutes Intravenous  Once 03/30/19 1922 03/30/19 2110          Medications  Scheduled Meds:  chlorhexidine  15 mL Mouth Rinse BID   Chlorhexidine Gluconate Cloth  6 each Topical Q2200   clopidogrel  75 mg Per Tube Daily   [START ON 04/07/2019] enoxaparin (LOVENOX) injection  85 mg Subcutaneous Q24H   enoxaparin (LOVENOX) injection  1.5 mg/kg Subcutaneous Once   feeding supplement (PRO-STAT SUGAR FREE 64)  30 mL Per Tube BID  feeding supplement (VITAL HIGH PROTEIN)  1,000 mL Per Tube Q24H   fluticasone furoate-vilanterol  1 puff Inhalation Daily   folic acid  1 mg Per Tube Daily   free water  200 mL Per Tube Q8H   mouth rinse  15 mL Mouth Rinse q12n4p   methylPREDNISolone (SOLU-MEDROL) injection  20 mg Intravenous Daily   metoprolol tartrate  5 mg Intravenous Q6H   multivitamin with minerals  1 tablet Per Tube Daily   pantoprazole sodium  40 mg Per Tube QHS   potassium chloride  40 mEq Oral Once   thiamine  100 mg Per Tube Daily   umeclidinium bromide  1 puff Inhalation Daily   Continuous Infusions:  sodium chloride 10 mL/hr at 04/05/19 0800   ampicillin-sulbactam (UNASYN) IV 3 g (04/06/19 1014)   magnesium sulfate LVP 250-500 ml 4 g (04/06/19 1023)   potassium chloride 10 mEq (04/06/19 1018)   PRN Meds:.albuterol, fentaNYL (SUBLIMAZE) injection, LORazepam, [DISCONTINUED] ondansetron **OR** ondansetron (ZOFRAN) IV      Subjective:   Joshua Whitaker was seen and examined today.  *Alert awake and following commands.  States feeling better, O2 sats 100% on 2 L. Patient denies dizziness, chest pain,  abdominal pain, N/V/D/C.  Shortness of breath improving.  Objective:   Vitals:   04/06/19 0431 04/06/19 0500 04/06/19 0533 04/06/19 0804  BP: (!) 120/58   128/69  Pulse: 70 72 74 74  Resp:  20 20 (!) 21  Temp: 97.6 F (36.4 C)   97.8 F (36.6 C)  TempSrc: Oral   Oral  SpO2: 96% 100% 100% 100%  Weight:   58.6 kg   Height:         Intake/Output Summary (Last 24 hours) at 04/06/2019 1147 Last data filed at 04/06/2019 0500 Gross per 24 hour  Intake 835.94 ml  Output 2001 ml  Net -1165.06 ml     Wt Readings from Last 3 Encounters:  04/06/19 58.6 kg  03/16/19 64.4 kg  01/06/19 63 kg     Exam  General: Alert and oriented x2, slightly confused  Eyes:   HEENT:  Atraumatic, normocephalic  Cardiovascular: S1 S2 auscultated,  Regular rate and rhythm.  Respiratory: Diminished breath sounds bilaterally, no wheezing  Gastrointestinal: Soft, nontender, nondistended, + bowel sounds  Ext: no pedal edema bilaterally  Neuro: Moving all 4 extremities  Musculoskeletal: No digital cyanosis, clubbing  Skin: No rashes  Psych: Alert and awake, somewhat confused   Data Reviewed:  I have personally reviewed following labs and imaging studies  Micro Results Recent Results (from the past 240 hour(s))  MRSA PCR Screening     Status: None   Collection Time: 03/30/19  9:38 PM  Result Value Ref Range Status   MRSA by PCR NEGATIVE NEGATIVE Final    Comment:        The GeneXpert MRSA Assay (FDA approved for NASAL specimens only), is one component of a comprehensive MRSA colonization surveillance program. It is not intended to diagnose MRSA infection nor to guide or monitor treatment for MRSA infections. Performed at [facility], [address] [place], [place] [address]   Respiratory Panel by PCR     Status: None   Collection Time: 04/01/19 12:55 PM  Result Value Ref Range Status   Adenovirus NOT DETECTED NOT DETECTED Final   Coronavirus 229E NOT DETECTED NOT DETECTED Final    Comment: (NOTE) The Coronavirus on the Respiratory Panel, DOES NOT test for the novel  Coronavirus (2019 nCoV)  Coronavirus HKU1 NOT DETECTED NOT DETECTED Final   Coronavirus NL63 NOT DETECTED NOT DETECTED Final   Coronavirus OC43 NOT DETECTED NOT DETECTED Final   Metapneumovirus NOT DETECTED NOT DETECTED Final    Rhinovirus / Enterovirus NOT DETECTED NOT DETECTED Final   Influenza A NOT DETECTED NOT DETECTED Final   Influenza B NOT DETECTED NOT DETECTED Final   Parainfluenza Virus 1 NOT DETECTED NOT DETECTED Final   Parainfluenza Virus 2 NOT DETECTED NOT DETECTED Final   Parainfluenza Virus 3 NOT DETECTED NOT DETECTED Final   Parainfluenza Virus 4 NOT DETECTED NOT DETECTED Final   Respiratory Syncytial Virus NOT DETECTED NOT DETECTED Final   Bordetella pertussis NOT DETECTED NOT DETECTED Final   Chlamydophila pneumoniae NOT DETECTED NOT DETECTED Final   Mycoplasma pneumoniae NOT DETECTED NOT DETECTED Final    Comment: Performed at [facility], [place] [address], [place], [place] [address]  Novel Coronavirus, NAA (hospital order; send-out to ref lab)     Status: None   Collection Time: 04/01/19 12:56 PM  Result Value Ref Range Status   SARS-CoV-2, NAA NOT DETECTED NOT DETECTED Final    Comment: Negative (Not Detected) results do not exclude infection caused by SARS CoV 2 and should not be used as the sole basis for treatment or other patient management decisions. Optimum specimen types and timing for peak viral levels during infections caused  by SARS CoV 2 have not been determined. Collection of multiple specimens (types and time points) from the same patient may be necessary to detect the virus. Improper specimen collection and handling, sequence variability underlying assay primers and or probes, or the presence of organisms in  quantities less than the limit of detection of the assay may lead to false negative results. Positive and negative predictive values of testing are highly dependent on prevalence. False negative results are more likely when prevalence of disease is high. (NOTE) The expected result is Negative (Not Detected). The SARS CoV 2 test is intended for the presumptive qualitative  detection of nucleic acid from SARS CoV 2 in upper and lower  respir atory specimens. Testing  methodology is real time RT PCR. Test results must be correlated with clinical presentation and  evaluated in the context of other laboratory and epidemiologic data.  Test performance can be affected because the epidemiology and  clinical spectrum of infection caused by SARS CoV 2 is not fully  known. For example, the optimum types of specimens to collect and  when during the course of infection these specimens are most likely  to contain detectable viral RNA may not be known. This test has not been Food and Drug Administration (FDA) cleared or  approved and has been authorized by FDA under an Emergency Use  Authorization (EUA). The test is only authorized for the duration of  the declaration that circumstances exist justifying the authorization  of emergency use of in vitro diagnostic tests for detection and or  diagnosis of SARS CoV 2 under Section 564(b)(1) of the Act, 21 U.S.C.  section [phone] 3(b)(1), unless the authorization is terminated or   revoked sooner. [facility] is certified under the  Clinical Laboratory Improvement Amendments of 1988 (CLIA), 42 U.S.C.  section [phone], to perform high complexity tests. Performed at [place] [id number] [address], [place], [place] [address] Laboratory Director: Loleta Books, MD Fact Sheet for Healthcare Providers  BankingDealers.co.za Fact Sheet for Patients  StrictlyIdeas.no Performed at [facility]  [facility], [place] [address] [place], [place] [address]    Coronavirus Source NASOPHARYNGEAL  Final    Comment: Performed at [facility], [address], [place], [place] [address]  Culture, respiratory (tracheal aspirate)     Status: None   Collection Time: 04/01/19  3:36 PM  Result Value Ref Range Status   Specimen Description TRACHEAL ASPIRATE  Final   Special Requests NONE  Final   Gram Stain   Final    ABUNDANT WBC PRESENT,  PREDOMINANTLY PMN RARE GRAM POSITIVE COCCI RARE GRAM POSITIVE RODS    Culture   Final    RARE Consistent with normal respiratory flora. Performed at [facility], [place] [address], [place], [place] [address]    Report Status 04/04/2019 FINAL  Final    Radiology Reports Dg Chest 1 View  Result Date: 04/01/2019 CLINICAL DATA:  Endotracheal tube.  Gastric tube EXAM: CHEST  1 VIEW COMPARISON:  04/01/2019 FINDINGS: Endotracheal tube in good position.  NG tube in the stomach. COPD with apical emphysema bilaterally. Small bilateral effusions. Mild bibasilar airspace disease unchanged. IMPRESSION: Endotracheal tube in good position.  NG tube in the stomach Severe COPD with apical emphysema. No change in mild bibasilar airspace disease and small effusions. Electronically Signed   By: Franchot Gallo M.D.   On: 04/01/2019 18:28   Dg Chest 2 View  Result Date: 03/30/2019 CLINICAL DATA:  Shortness of breath which is recurrent but intermittent. EXAM: CHEST - 2 VIEW COMPARISON:  03/13/2019.  01/06/2019.  Chest CT 04/30/2018. FINDINGS: Cardiomegaly and aortic atherosclerosis as seen previously. Emphysema, upper lobe predominant. Pulmonary scarring. Focal density in the left upper lobe as seen previously, felt to represent scarring by CT. Increased patchy density in the mid and lower lungs not seen previously that could represent superimposed pneumonia. IMPRESSION: Background severe COPD. Newly seen patchy density in the mid and lower lungs left more than right that could represent overlying acute pneumonia. Electronically Signed   By: Nelson Chimes M.D.   On: 03/30/2019 17:50   Dg Abd 1 View  Result Date: 04/06/2019 CLINICAL DATA:  Check gastric catheter placement EXAM: ABDOMEN - 1 VIEW COMPARISON:  04/05/2019 FINDINGS: Gastric catheter as been further advanced into the stomach. Tip lies in the stomach although the proximal side port remains in the distal esophagus. This could be advanced several cm.  Degenerative changes of the lumbar spine are noted. Diffuse vascular calcifications are noted. IMPRESSION: Gastric catheter within the stomach as described. This could be advanced several cm. Electronically Signed   By: Inez Catalina M.D.   On: 04/06/2019 00:44   Dg Abd 1 View  Result Date: 04/05/2019 CLINICAL DATA:  78 y/o male NG tube placement. EXAM: ABDOMEN - 1 VIEW COMPARISON:  Plain film of the abdomen from earlier same day. FINDINGS: Enteric tube is now coiled within the lower esophagus. Visualized bowel gas pattern is nonobstructive. No evidence of free intraperitoneal air. Cholecystectomy clips in the RIGHT upper quadrant. IMPRESSION: Enteric tube now coiled within the lower esophagus. Recommend advancing at least 10 cm. These results will be called to the ordering clinician or representative by the Radiologist Assistant, and communication documented in the PACS or zVision Dashboard. Electronically Signed   By: Franki Cabot M.D.   On: 04/05/2019 17:57   Ct Head Wo Contrast  Result Date: 03/31/2019 CLINICAL DATA:  Respiratory distress, altered mental status EXAM: CT HEAD WITHOUT CONTRAST TECHNIQUE: Contiguous axial images were obtained from the base of the skull  through the vertex without intravenous contrast. COMPARISON:  None. FINDINGS: Brain: Diffuse brain atrophy and chronic white matter microvascular ischemic changes throughout both cerebral hemispheres. No acute intracranial hemorrhage mass lesion, infarction, midline shift, herniation, hydrocephalus, or extra-axial fluid collection. No focal mass effect or edema. Cisterns are patent. Cerebellar atrophy as well. Vascular: Intracranial atherosclerosis.  No hyperdense vessel. Skull: Normal. Negative for fracture or focal lesion. Sinuses/Orbits: Minor mucosal thickening throughout the paranasal sinuses. No orbital abnormality or acute finding. Mastoids are clear. Other: None. IMPRESSION: Brain atrophy and chronic white matter microvascular ischemic  changes. No acute intracranial abnormality by noncontrast CT. Electronically Signed   By: Jerilynn Mages.  Shick M.D.   On: 03/31/2019 14:57   Dg Chest Port 1 View  Result Date: 04/02/2019 CLINICAL DATA:  Acute respiratory failure EXAM: PORTABLE CHEST 1 VIEW COMPARISON:  Yesterday FINDINGS: Endotracheal tube tip between the clavicular heads and carina. The orogastric tube reaches the stomach. COPD with emphysema and interstitial coarsening. There may be trace pleural fluid. Mild hazy density at the left apex. No pneumothorax. IMPRESSION: 1. Stable hardware positioning. 2. COPD with mild lung opacity and trace pleural fluid. Electronically Signed   By: Monte Fantasia M.D.   On: 04/02/2019 08:08   Dg Chest Port 1 View  Result Date: 04/01/2019 CLINICAL DATA:  Respiratory distress.  Status post intubation today. EXAM: PORTABLE CHEST 1 VIEW COMPARISON:  CT chest 04/30/2018. Single-view of the chest 03/31/2019. PA and lateral chest 03/30/2019. FINDINGS: Endotracheal tube is in place with the tip in good position well above the carina. The patient also has a new NG tube. The tube is visualized into the lower chest but obscured distally due to underpenetration. The lungs are severely emphysematous. Bibasilar airspace disease is again seen. Aeration appears mildly improved in the bases, particularly on the right. No pneumothorax or pleural effusion. Cardiomegaly. Atherosclerosis. No acute or focal bony abnormality. IMPRESSION: ETT in good position. NG tube is visualized into the lower chest. The remainder of the NG tube is not visible due to underpenetration. Bibasilar airspace disease demonstrates some improvement on the right. Severe emphysema. Electronically Signed   By: Inge Rise M.D.   On: 04/01/2019 13:55   Dg Chest Port 1 View  Result Date: 03/31/2019 CLINICAL DATA:  Dyspnea EXAM: PORTABLE CHEST 1 VIEW COMPARISON:  03/30/2019 FINDINGS: Cardiac enlargement.  COPD. Interval development of right lower lobe airspace  disease, probable pneumonia. Small right effusion. Scarring in the left upper lobe unchanged. No definite heart failure IMPRESSION: Interval development of right lower lobe infiltrate, probable pneumonia. Small right effusion COPD Electronically Signed   By: Franchot Gallo M.D.   On: 03/31/2019 20:55   Dg Chest Port 1 View  Result Date: 03/13/2019 CLINICAL DATA:  Cough and chronic shortness of breath worse today, dyspnea EXAM: PORTABLE CHEST 1 VIEW COMPARISON:  01/06/2019 FINDINGS: Mild enlargement of cardiac silhouette. Atherosclerotic calcification aorta. Mediastinal contours and pulmonary vascularity normal. Emphysematous and mild bronchitic changes consistent with COPD. Scarring in LEFT upper lobe and minimally at LEFT base. No acute infiltrate, pleural effusion or pneumothorax. Bones demineralized. IMPRESSION: COPD changes without acute infiltrate. Mild enlargement of cardiac silhouette. Electronically Signed   By: Lavonia Dana M.D.   On: 03/13/2019 23:17   Dg Abd Portable 1v  Result Date: 04/05/2019 CLINICAL DATA:  Bedside OG tube placement. EXAM: PORTABLE ABDOMEN - 1 VIEW COMPARISON:  None. FINDINGS: The OG tube is looped and possibly kinked in the proximal stomach with its tip back up into the distal  esophagus. The tube should be withdrawn and readvanced. Visualized bowel gas pattern unremarkable. Aortoiliac atherosclerosis without evidence of aneurysm. IMPRESSION: 1. OG tube is looped and possibly kinked in the proximal stomach with its tip back up into the distal esophagus. The tube should be withdrawn and readvanced. 2. No acute abdominal abnormality. Electronically Signed   By: Evangeline Dakin M.D.   On: 04/05/2019 16:35    Lab Data:  CBC: Recent Labs  Lab 03/31/19 0413 04/01/19 0417 04/01/19 1608 04/02/19 0353 04/04/19 0608 04/06/19 0214  WBC 14.6* 11.5*  --  12.3* 15.0* 12.6*  NEUTROABS  --  10.3*  --   --   --   --   HGB 11.3* 11.4* 10.2* 10.2* 12.0* 12.9*  HCT 35.8* 34.9*  30.0* 32.4* 37.4* 38.1*  MCV 95.0 93.1  --  92.6 93.7 88.2  PLT 324 316  --  277 276 161   Basic Metabolic Panel: Recent Labs  Lab 03/31/19 0413 04/01/19 0417 04/01/19 1608 04/02/19 0353  04/04/19 0838 04/04/19 1638 04/05/19 0525 04/05/19 1757 04/06/19 0214  NA 137 139 139 139  --  145  --   --   --  135  K 4.6 4.5 4.6 4.2  --  3.3*  --   --   --  2.9*  CL 105 107  --  107  --  108  --   --   --  95*  CO2 23 24  --  23  --  25  --   --   --  28  GLUCOSE 130* 131*  --  132*  --  136*  --   --   --  104*  BUN 27* 40*  --  41*  --  33*  --   --   --  15  CREATININE 0.68 0.82  --  0.96  --  0.72  --   --   --  0.55*  CALCIUM 8.0* 8.2*  --  8.3*  --  8.6*  --   --   --  7.9*  MG  --   --   --  2.5*   < > 2.2 2.2 1.8 1.6* 1.6*  PHOS  --   --   --  3.1   < > 1.7* 2.2* 1.7* 3.8 2.5   < > = values in this interval not displayed.   GFR: Estimated Creatinine Clearance: 66.1 mL/min (A) (by C-G formula based on SCr of 0.55 mg/dL (L)). Liver Function Tests: Recent Labs  Lab 03/30/19 1657 03/31/19 0413 04/04/19 0838 04/06/19 0214  AST 74* 47* 62* 132*  ALT 92* 75* 123* 316*  ALKPHOS 117 97 69 67  BILITOT 0.3 0.3 0.5 0.6  PROT 6.3* 5.7* 5.3* 5.2*  ALBUMIN 3.0* 2.5* 2.4* 2.3*   No results for input(s): LIPASE, AMYLASE in the last 168 hours. No results for input(s): AMMONIA in the last 168 hours. Coagulation Profile: No results for input(s): INR, PROTIME in the last 168 hours. Cardiac Enzymes: Recent Labs  Lab 03/30/19 1646 03/30/19 1833 03/30/19 2251 03/31/19 0413 04/01/19 1329  CKTOTAL  --   --   --   --  40*  TROPONINI 1.12* 1.14* 1.45* 1.36*  --    BNP (last 3 results) No results for input(s): PROBNP in the last 8760 hours. HbA1C: No results for input(s): HGBA1C in the last 72 hours. CBG: Recent Labs  Lab 04/05/19 1957 04/06/19 0121 04/06/19 0428 04/06/19 0605 04/06/19 0801  GLUCAP 85 63*  103* 99 108*   Lipid Profile: No results for input(s): CHOL, HDL,  LDLCALC, TRIG, CHOLHDL, LDLDIRECT in the last 72 hours. Thyroid Function Tests: No results for input(s): TSH, T4TOTAL, FREET4, T3FREE, THYROIDAB in the last 72 hours. Anemia Panel: No results for input(s): VITAMINB12, FOLATE, FERRITIN, TIBC, IRON, RETICCTPCT in the last 72 hours. Urine analysis: No results found for: COLORURINE, APPEARANCEUR, LABSPEC, PHURINE, GLUCOSEU, HGBUR, BILIRUBINUR, KETONESUR, PROTEINUR, UROBILINOGEN, NITRITE, LEUKOCYTESUR   Ozro Russett M.D. [facility] 04/06/2019, 11:47 AM  Pager: [phone] Between 7am to 7pm - call Pager - [phone]  After 7pm go to [email or web address] - password [email or web address]  Call night coverage person covering after 7pm

## 2019-04-06 NOTE — Progress Notes (Signed)
Attempt to call report to nurse [name]. Unavailable at present.

## 2019-04-06 NOTE — Progress Notes (Signed)
[place] for Heparin Indication: atrial fibrillation  Allergies  Allergen Reactions  . Tiotropium Bromide Monohydrate Other (See Comments)    Severe urinary retention  . Varenicline Other (See Comments)    Psychiatric and dreams  . Iodinated Diagnostic Agents   . Statins     Patient Measurements: Height: 5\' 10"  (177.8 cm) Weight: 132 lb 15 oz (60.3 kg) IBW/kg (Calculated) : 73  Vital Signs: Temp: 98.1 F (36.7 C) (04/13 0127) Temp Source: Oral (04/13 0127) BP: 135/90 (04/13 0127) Pulse Rate: 72 (04/13 0300)  Labs: Recent Labs    04/04/19 0608 04/04/19 0838 04/05/19 1757 04/06/19 0214  HGB 12.0*  --   --  12.9*  HCT 37.4*  --   --  38.1*  PLT 276  --   --  256  HEPARINUNFRC  --   --  0.11* 0.13*  CREATININE  --  0.72  --  0.55*    Estimated Creatinine Clearance: 68 mL/min (A) (by C-G formula based on SCr of 0.55 mg/dL (L)).  Assessment: CC/HPI: shortness of breath; covid r/o  PMH: COPD coronary artery disease, alcohol and tobacco abuse, hyperlipidemia, hypertension and chronic hyponatremia   Anticoag: New onset afib and started on apixaban 5mg  at [facility] (age <80, wt >60, Scr 0.96); [facility] 3 Aspirating>>hep gtt 4/12  Heme/Onc: H&H 12/37.4, Plt 276 Ddimer 3.72  4/13 AM update: heparin level low, no issues per RN.   Goal of Therapy:  Heparin level 0.3-0.7 units/ml Monitor platelets by anticoagulation protocol: Yes   Plan:  Inc heparin to 950 units/hr Re-check heparin level in 8 hours  Narda Bonds, PharmD, [place] Pharmacist Phone: [phone]

## 2019-04-06 NOTE — Progress Notes (Signed)
Late entry for 1500- pt received from 2w,  vss , ra sat 91% but fingers are cold and bluish in tips, ear sat reading 100%, pt has congested cough, oral suction for small amount of light yellow sputum, ns at [facility] as ordered, foley with secure device to bedside drainage, report TF stopped at 1430 and for u/s abdomen and needs npo for at least 5 hours, sent message to pharmacy to retime jevity since on hold dtr christie called to talk with pt, informed I found his wedding band in his belongings bag , I put in pyxis personal bin, all questions entertained and answered

## 2019-04-06 NOTE — Progress Notes (Signed)
[place] for Heparin>>lovenox Indication: atrial fibrillation  Allergies  Allergen Reactions  . Tiotropium Bromide Monohydrate Other (See Comments)    Severe urinary retention  . Varenicline Other (See Comments)    Psychiatric and dreams  . Iodinated Diagnostic Agents   . Statins     Patient Measurements: Height: 5\' 10"  (177.8 cm) Weight: 129 lb 3 oz (58.6 kg) IBW/kg (Calculated) : 73  Vital Signs: Temp: 97.8 F (36.6 C) (04/13 0804) Temp Source: Oral (04/13 0804) BP: 128/69 (04/13 0804) Pulse Rate: 74 (04/13 0804)  Labs: Recent Labs    04/04/19 0608 04/04/19 0838 04/05/19 1757 04/06/19 0214  HGB 12.0*  --   --  12.9*  HCT 37.4*  --   --  38.1*  PLT 276  --   --  256  HEPARINUNFRC  --   --  0.11* 0.13*  CREATININE  --  0.72  --  0.55*    Estimated Creatinine Clearance: 66.1 mL/min (A) (by C-G formula based on SCr of 0.55 mg/dL (L)).  Assessment: CC/HPI: shortness of breath; covid r/o  PMH: COPD coronary artery disease, alcohol and tobacco abuse, hyperlipidemia, hypertension and chronic hyponatremia   Anticoag: New onset afib and started on apixaban 5mg  at [facility] (age <80, wt >60, Scr 0.96).   He was transitioned to IV heparin due to PO intake. His COVID test came back neg today. D/w Dr. Tana Coast, we will change to once daily lovenox instead.   Goal of Therapy:  Anti-Xa 1-2 Monitor platelets by anticoagulation protocol: Yes   Plan:  Dc heparin Lovenox 85mg  SQ q24 F/u with apixaban resume  Onnie Boer, PharmD, [place], AAHIVP, CPP Infectious Disease Pharmacist 04/06/2019 11:04 AM

## 2019-04-06 NOTE — Progress Notes (Signed)
Pt transferred to unit.

## 2019-04-06 NOTE — Progress Notes (Signed)
Wife called to inform pt had been relocated with whereabouts and phone number to unit nursing station.

## 2019-04-07 ENCOUNTER — Inpatient Hospital Stay (HOSPITAL_COMMUNITY): Payer: Medicare Other

## 2019-04-07 LAB — COMPREHENSIVE METABOLIC PANEL
ALT: 318 U/L — ABNORMAL HIGH (ref 0–44)
AST: 109 U/L — ABNORMAL HIGH (ref 15–41)
Albumin: 2.4 g/dL — ABNORMAL LOW (ref 3.5–5.0)
Alkaline Phosphatase: 60 U/L (ref 38–126)
Anion gap: 9 (ref 5–15)
BUN: 12 mg/dL (ref 8–23)
CO2: 29 mmol/L (ref 22–32)
Calcium: 7.9 mg/dL — ABNORMAL LOW (ref 8.9–10.3)
Chloride: 97 mmol/L — ABNORMAL LOW (ref 98–111)
Creatinine, Ser: 0.63 mg/dL (ref 0.61–1.24)
GFR calc Af Amer: >60 mL/min (ref >60)
GFR calc non Af Amer: >60 mL/min (ref >60)
Glucose, Bld: 83 mg/dL (ref 70–99)
Potassium: 2.9 mmol/L — ABNORMAL LOW (ref 3.5–5.1)
Sodium: 135 mmol/L (ref 135–145)
Total Bilirubin: 0.8 mg/dL (ref 0.3–1.2)
Total Protein: 5.1 g/dL — ABNORMAL LOW (ref 6.5–8.1)

## 2019-04-07 LAB — HEPATITIS PANEL, ACUTE
HCV Ab: <0.1 s/co ratio (ref 0.0–0.9)
Hep A IgM: NEGATIVE
Hep B C IgM: NEGATIVE
Hepatitis B Surface Ag: NEGATIVE

## 2019-04-07 LAB — CBC
HCT: 40.1 % (ref 39.0–52.0)
Hemoglobin: 13.6 g/dL (ref 13.0–17.0)
MCH: 29.8 pg (ref 26.0–34.0)
MCHC: 33.9 g/dL (ref 30.0–36.0)
MCV: 87.9 fL (ref 80.0–100.0)
Platelets: 257 10*3/uL (ref 150–400)
RBC: 4.56 MIL/uL (ref 4.22–5.81)
RDW: 13.2 % (ref 11.5–15.5)
WBC: 12.2 10*3/uL — ABNORMAL HIGH (ref 4.0–10.5)
nRBC: 0 % (ref 0.0–0.2)

## 2019-04-07 LAB — MAGNESIUM: Magnesium: 1.9 mg/dL (ref 1.7–2.4)

## 2019-04-07 LAB — GLUCOSE, CAPILLARY
Glucose-Capillary: 73 mg/dL (ref 70–99)
Glucose-Capillary: 84 mg/dL (ref 70–99)
Glucose-Capillary: 149 mg/dL — ABNORMAL HIGH (ref 70–99)
Glucose-Capillary: 126 mg/dL — ABNORMAL HIGH (ref 70–99)
Glucose-Capillary: 95 mg/dL (ref 70–99)

## 2019-04-07 LAB — PHOSPHORUS: Phosphorus: 1.9 mg/dL — ABNORMAL LOW (ref 2.5–4.6)

## 2019-04-07 MED ORDER — ADULT MULTIVITAMIN W/MINERALS CH
1.0000 | ORAL_TABLET | Freq: Every day | ORAL | Status: DC
  Administered 2019-04-08 – 2019-04-10 (×3): 1 via ORAL
  Filled 2019-04-07 (×3): qty 1

## 2019-04-07 MED ORDER — ENSURE ENLIVE PO LIQD
237.0000 mL | Freq: Three times a day (TID) | ORAL | Status: DC
  Administered 2019-04-07: 237 mL via ORAL

## 2019-04-07 MED ORDER — RESOURCE THICKENUP CLEAR PO POWD
ORAL | Status: DC | PRN
  Filled 2019-04-07: qty 125

## 2019-04-07 MED ORDER — POTASSIUM CHLORIDE 10 MEQ/100ML IV SOLN
10.0000 meq | INTRAVENOUS | Status: AC
  Administered 2019-04-07 (×3): 10 meq via INTRAVENOUS
  Filled 2019-04-07 (×3): qty 100

## 2019-04-07 MED ORDER — POTASSIUM PHOSPHATES 15 MMOLE/5ML IV SOLN
40.0000 meq | Freq: Once | INTRAVENOUS | Status: AC
  Administered 2019-04-07: 10:00:00 40 meq via INTRAVENOUS
  Filled 2019-04-07: qty 9.09

## 2019-04-07 NOTE — Progress Notes (Signed)
Modified Barium Swallow Progress Note  Patient Details  Name: Joshua Whitaker MRN: [id number] Date of Birth: 1941-11-23  Today's Date: 04/07/2019  Modified Barium Swallow completed.  Full report located under Chart Review in the Imaging Section.  Brief recommendations include the following:  Clinical Impression  Pt has a mild pharyngeal dysphagia with sensory and motor deficits that could be explained by recent intubation and generalized deconditioning. His oral phase is functional considering limited dentition, with mildly prolonged, anterior mastication noted as this is where the majority of his teeth are. He has reduced hyolaryngeal movement, epiglottic deflection, and base of tongue retraction, with moderate vallecular residue post-swallow that is most prominent with more solid textures. This also results in penetration during the swallow with thin and nectar thick liquids, but with nectar thick liquid penetration transient in nature. Silent aspiration occurs on larger volumes of thin liquids. Coughing occurred frequently during testing but was not in relation to airway compromise on any barium, although he did seem to have thick secretions. Given the above as well as importance of conserving energy, recommend starting with Dys 1 diet and nectar thick liquids with good prognosis for advancement with additional time post-extubation and increase in strength of cough/overall cough.    Swallow Evaluation Recommendations       SLP Diet Recommendations: Dysphagia 1 (Puree) solids;Nectar thick liquid   Liquid Administration via: Cup;Straw   Medication Administration: Crushed with puree   Supervision: Staff to assist with self feeding;Full supervision/cueing for compensatory strategies   Compensations: Slow rate;Small sips/bites   Postural Changes: Seated upright at 90 degrees;Remain semi-upright after after feeds/meals (Comment)   Oral Care Recommendations: Oral care BID   Other  Recommendations: Order thickener from pharmacy;Prohibited food (jello, ice cream, thin soups);Remove water pitcher;Have oral suction available    Venita Sheffield Jermaine Tholl 04/07/2019,1:39 PM   Pollyann Glen, M.A. [place] Acute Environmental education officer [phone] Office [phone]

## 2019-04-07 NOTE — Progress Notes (Signed)
PROGRESS NOTE    Joshua Whitaker  SEG:[id number] DOB: 1941/08/25 DOA: 03/30/2019 PCP: [name], MD   Brief Narrative: 78YoM with extensive history of COPD, CAD, EtOH/tobacco abuse, HTN, HLD, chronic hyponatremia presented from [facility], he was was admitted 4/6 w 2 day history of cough, shortness of breath with recent hospitalization 3/20-3/23 for acute COPD exacerbation and severe hyponatremia/beer potomania of 109. On 4/7 he desaturated to 70s, needing BiPAP, was taken off BiPAP on 4 x 8 but had respiratory distress subsequently intubated and transferred to [facility].  He was found to have new onset a flutter with RVR elevated troponin, seen by cardiology.  Patient was was extubated 4/11 and was transferred to [facility] service 4/13.  He had COVID-19 test done that was negative.  Subjective: Resting comfortably, appears frail and weak.  Had pulled out NG tube overnight.  Assessment & Plan:   Acute on chronic respiratory failure with hypoxia secondary to COPD exacerbation, HCAP with component of aspiration pneumonia: Currently on 2 L nasal cannula, saturating well.  Continue on current nebs with Breo, incurse ellipta, IV steroid 20 mg daily for 3 more days. Cont Unasyn.  Clinically appears improving leukocytosis down to 12,200, afebrile . Encourage ambualtion.  SLP eval/MBS done this morning and placed on dysphagia 1 diet.  Dysphagia with aspiration:starting dysphagia 1 diet.  Acute exacerbation of  COPD: Continue steroids,supplemental o2 and bronchodilators as above.  New onset Atrial flutter w RVR:in NSR now.Continue Cardizem po.Seen by cardiology and signed off- patient on therapeutic lovenox-they advised apixaban when able to able take p.o. for 6  Weeks. Stop aspirin when apixaban started and cont continue Plavix. Follow-up with cardiology for 14-day event monitoring as outpatient.May not be an ideal candidate for long-term anticoagulation due to his alcohol  use.  Hypokalemia/hypomagnesemia/hypophosphatemia: Replacing.  Transaminitis: ast/alt 109/318.Unclear etiology, acute viral fibroids panel was negative, ultrasound abdomen showed cholecystic to me otherwise.   Adult failure to thrive/moderate protein calorie malnutrition with BMI 18.4 : Augment nutrition.  DVT prophylaxis: SCD/Heparin/lovenox Code Status: FULL CODE.  Palliative care consulted. Family Communication: discussed POC w patient.I called patient's daughter and updated plan of care.  Has pending PT OT evaluation, she is reluctant for skilled nursing facility placement during covid 19 and would rather try for home  Disposition Plan: remains inpatient pending clinical improvement. PT/OT eval requested.   Consultants:  PCCM  Procedures: RUQ [place] 1. Right pleural effusion. 2. Cholecystectomy. Otherwise unremarkable right upper quadrant  Antimicrobials: Anti-infectives (From admission, onward)   Start     Dose/Rate Route Frequency Ordered Stop   04/05/19 1100  Ampicillin-Sulbactam (UNASYN) 3 g in sodium chloride 0.9 % 100 mL IVPB     3 g 200 mL/hr over 30 Minutes Intravenous Every 8 hours 04/05/19 1008 04/09/19 1059   04/04/19 1630  azithromycin (ZITHROMAX) 250 mg in dextrose 5 % 125 mL IVPB  Status:  Discontinued     250 mg 125 mL/hr over 60 Minutes Intravenous Every 24 hours 04/04/19 1616 04/05/19 1008   04/03/19 1700  azithromycin (ZITHROMAX) tablet 250 mg  Status:  Discontinued     250 mg Oral Daily 04/02/19 1115 04/04/19 1616   04/02/19 1700  ceFEPIme (MAXIPIME) 2 g in sodium chloride 0.9 % 100 mL IVPB  Status:  Discontinued     2 g 200 mL/hr over 30 Minutes Intravenous Every 12 hours 04/02/19 1058 04/02/19 1115   04/02/19 1700  cefTRIAXone (ROCEPHIN) 1 g in sodium chloride 0.9 % 100 mL IVPB  Status:  Discontinued     1 g 200 mL/hr over 30 Minutes Intravenous Every 24 hours 04/02/19 1115 04/05/19 1008   04/02/19 1700  azithromycin (ZITHROMAX) tablet 500 mg     500 mg  Per Tube Daily 04/02/19 1115 04/02/19 1751   04/01/19 2300  vancomycin (VANCOCIN) 1,250 mg in sodium chloride 0.9 % 250 mL IVPB  Status:  Discontinued     1,250 mg 166.7 mL/hr over 90 Minutes Intravenous Every 24 hours 03/31/19 2227 04/01/19 1052   04/01/19 2200  ceFEPIme (MAXIPIME) 2 g in sodium chloride 0.9 % 100 mL IVPB  Status:  Discontinued     2 g 200 mL/hr over 30 Minutes Intravenous Every 8 hours 04/01/19 1558 04/02/19 1058   03/31/19 2300  ceFEPIme (MAXIPIME) 2 g in sodium chloride 0.9 % 100 mL IVPB  Status:  Discontinued     2 g 200 mL/hr over 30 Minutes Intravenous Every 8 hours 03/31/19 2220 04/01/19 1558   03/31/19 2300  vancomycin (VANCOCIN) 1,250 mg in sodium chloride 0.9 % 250 mL IVPB     1,250 mg 166.7 mL/hr over 90 Minutes Intravenous  Once 03/31/19 2221 04/01/19 0130   03/31/19 1800  levofloxacin (LEVAQUIN) IVPB 750 mg  Status:  Discontinued     750 mg 100 mL/hr over 90 Minutes Intravenous Every 24 hours 03/30/19 2222 03/31/19 0841   03/30/19 1930  levofloxacin (LEVAQUIN) IVPB 750 mg     750 mg 100 mL/hr over 90 Minutes Intravenous  Once 03/30/19 1922 03/30/19 2110       Objective: Vitals:   04/07/19 0400 04/07/19 0621 04/07/19 0840 04/07/19 1236  BP: (!) 158/66 (!) 153/68  (!) 129/57  Pulse: 70 68  78  Resp: 18   20  Temp: 98.2 F (36.8 C)   98 F (36.7 C)  TempSrc: Oral   Oral  SpO2: 98% 100% 99% 98%  Weight: 58.2 kg     Height:        Intake/Output Summary (Last 24 hours) at 04/07/2019 1421 Last data filed at 04/07/2019 1350 Gross per 24 hour  Intake 1346.18 ml  Output 2700 ml  Net -1353.82 ml   Filed Weights   04/03/19 0500 04/06/19 0533 04/07/19 0400  Weight: 60.3 kg 58.6 kg 58.2 kg   Weight change: -0.4 kg  Body mass index is 18.41 kg/m.  Intake/Output from previous day: 04/13 0701 - 04/14 0700 In: 302.2 [I.V.:202.2; IV Piggyback:100] Out: 2100 [Urine:2100] Intake/Output this shift: Total I/O In: 1044 [P.O.:100; I.V.:544; IV  Piggyback:400] Out: 600 [Urine:600]  Examination:  General exam: Appears calm and comfortable,Not in distress, older fore the age HEENT:PERRL,Oral mucosa moist, Ear/Nose normal on gross exam Respiratory system: Bilateral equal air entry, normal vesicular breath sounds, no wheezes or crackles  Cardiovascular system: S1 & S2 heard,No JVD, murmurs. Gastrointestinal system: Abdomen is  soft, non tender, non distended, BS +  Nervous System:Alert and oriented. No focal neurological deficits/moving extremities, sensation intact. Extremities: No edema, no clubbing, distal peripheral pulses palpable. Skin: No rashes, lesions, no icterus MSK: Normal muscle bulk,tone ,power  Medications:  Scheduled Meds:  chlorhexidine  15 mL Mouth Rinse BID   Chlorhexidine Gluconate Cloth  6 each Topical Q2200   clopidogrel  75 mg Per Tube Daily   enoxaparin (LOVENOX) injection  85 mg Subcutaneous Q24H   feeding supplement (ENSURE ENLIVE)  237 mL Oral TID BM   fluticasone furoate-vilanterol  1 puff Inhalation Daily   folic acid  1 mg Per Tube Daily  mouth rinse  15 mL Mouth Rinse q12n4p   methylPREDNISolone (SOLU-MEDROL) injection  20 mg Intravenous Daily   metoprolol tartrate  5 mg Intravenous Q6H   [START ON 04/08/2019] multivitamin with minerals  1 tablet Oral Daily   pantoprazole sodium  40 mg Per Tube QHS   potassium chloride  40 mEq Oral Once   thiamine  100 mg Per Tube Daily   umeclidinium bromide  1 puff Inhalation Daily   Continuous Infusions:  sodium chloride 10 mL/hr at 04/06/19 1203   ampicillin-sulbactam (UNASYN) IV Stopped (04/07/19 1100)   dextrose 5 % and 0.9% NaCl 50 mL/hr at 04/06/19 2228   potassium PHOSPHATE IVPB (mEq) 40 mEq (04/07/19 0937)    Data Reviewed: I have personally reviewed following labs and imaging studies  CBC: Recent Labs  Lab 04/01/19 0417 04/01/19 1608 04/02/19 0353 04/04/19 0608 04/06/19 0214 04/07/19 0638  WBC 11.5*  --  12.3* 15.0*  12.6* 12.2*  NEUTROABS 10.3*  --   --   --   --   --   HGB 11.4* 10.2* 10.2* 12.0* 12.9* 13.6  HCT 34.9* 30.0* 32.4* 37.4* 38.1* 40.1  MCV 93.1  --  92.6 93.7 88.2 87.9  PLT 316  --  277 276 256 993   Basic Metabolic Panel: Recent Labs  Lab 04/01/19 0417 04/01/19 1608 04/02/19 0353  04/04/19 0838  04/05/19 0525 04/05/19 1757 04/06/19 0214 04/06/19 1605 04/07/19 0638  NA 139 139 139  --  145  --   --   --  135  --  135  K 4.5 4.6 4.2  --  3.3*  --   --   --  2.9*  --  2.9*  CL 107  --  107  --  108  --   --   --  95*  --  97*  CO2 24  --  23  --  25  --   --   --  28  --  29  GLUCOSE 131*  --  132*  --  136*  --   --   --  104*  --  83  BUN 40*  --  41*  --  33*  --   --   --  15  --  12  CREATININE 0.82  --  0.96  --  0.72  --   --   --  0.55*  --  0.63  CALCIUM 8.2*  --  8.3*  --  8.6*  --   --   --  7.9*  --  7.9*  MG  --   --  2.5*   < > 2.2   < > 1.8 1.6* 1.6* 2.3 1.9  PHOS  --   --  3.1   < > 1.7*   < > 1.7* 3.8 2.5 1.9* 1.9*   < > = values in this interval not displayed.   GFR: Estimated Creatinine Clearance: 65.7 mL/min (by C-G formula based on SCr of 0.63 mg/dL). Liver Function Tests: Recent Labs  Lab 04/04/19 0838 04/06/19 0214 04/07/19 0638  AST 62* 132* 109*  ALT 123* 316* 318*  ALKPHOS 69 67 60  BILITOT 0.5 0.6 0.8  PROT 5.3* 5.2* 5.1*  ALBUMIN 2.4* 2.3* 2.4*   No results for input(s): LIPASE, AMYLASE in the last 168 hours. No results for input(s): AMMONIA in the last 168 hours. Coagulation Profile: No results for input(s): INR, PROTIME in the last 168 hours. Cardiac Enzymes: Recent Labs  Lab 04/01/19 1329  CKTOTAL 40*   BNP (last 3 results) No results for input(s): PROBNP in the last 8760 hours. HbA1C: No results for input(s): HGBA1C in the last 72 hours. CBG: Recent Labs  Lab 04/06/19 1145 04/06/19 2144 04/07/19 0352 04/07/19 0820 04/07/19 1248  GLUCAP 92 88 73 84 149*   Lipid Profile: No results for input(s): CHOL, HDL, LDLCALC,  TRIG, CHOLHDL, LDLDIRECT in the last 72 hours. Thyroid Function Tests: No results for input(s): TSH, T4TOTAL, FREET4, T3FREE, THYROIDAB in the last 72 hours. Anemia Panel: No results for input(s): VITAMINB12, FOLATE, FERRITIN, TIBC, IRON, RETICCTPCT in the last 72 hours. Sepsis Labs: Recent Labs  Lab 04/01/19 1329 04/02/19 0353 04/03/19 0324  PROCALCITON <0.10 <0.10 <0.10    Recent Results (from the past 240 hour(s))  MRSA PCR Screening     Status: None   Collection Time: 03/30/19  9:38 PM  Result Value Ref Range Status   MRSA by PCR NEGATIVE NEGATIVE Final    Comment:        The GeneXpert MRSA Assay (FDA approved for NASAL specimens only), is one component of a comprehensive MRSA colonization surveillance program. It is not intended to diagnose MRSA infection nor to guide or monitor treatment for MRSA infections. Performed at [facility], [address], [place], [place] [address]   Respiratory Panel by PCR     Status: None   Collection Time: 04/01/19 12:55 PM  Result Value Ref Range Status   Adenovirus NOT DETECTED NOT DETECTED Final   Coronavirus 229E NOT DETECTED NOT DETECTED Final    Comment: (NOTE) The Coronavirus on the Respiratory Panel, DOES NOT test for the novel  Coronavirus (2019 nCoV)    Coronavirus HKU1 NOT DETECTED NOT DETECTED Final   Coronavirus NL63 NOT DETECTED NOT DETECTED Final   Coronavirus OC43 NOT DETECTED NOT DETECTED Final   Metapneumovirus NOT DETECTED NOT DETECTED Final   Rhinovirus / Enterovirus NOT DETECTED NOT DETECTED Final   Influenza A NOT DETECTED NOT DETECTED Final   Influenza B NOT DETECTED NOT DETECTED Final   Parainfluenza Virus 1 NOT DETECTED NOT DETECTED Final   Parainfluenza Virus 2 NOT DETECTED NOT DETECTED Final   Parainfluenza Virus 3 NOT DETECTED NOT DETECTED Final   Parainfluenza Virus 4 NOT DETECTED NOT DETECTED Final   Respiratory Syncytial Virus NOT DETECTED NOT DETECTED Final   Bordetella pertussis NOT DETECTED NOT  DETECTED Final   Chlamydophila pneumoniae NOT DETECTED NOT DETECTED Final   Mycoplasma pneumoniae NOT DETECTED NOT DETECTED Final    Comment: Performed at [facility], [place] [address], [place], [place] [address]  Novel Coronavirus, NAA (hospital order; send-out to ref lab)     Status: None   Collection Time: 04/01/19 12:56 PM  Result Value Ref Range Status   SARS-CoV-2, NAA NOT DETECTED NOT DETECTED Final    Comment: Negative (Not Detected) results do not exclude infection caused by SARS CoV 2 and should not be used as the sole basis for treatment or other patient management decisions. Optimum specimen types and timing for peak viral levels during infections caused  by SARS CoV 2 have not been determined. Collection of multiple specimens (types and time points) from the same patient may be necessary to detect the virus. Improper specimen collection and handling, sequence variability underlying assay primers and or probes, or the presence of organisms in  quantities less than the limit of detection of the assay may lead to false negative results. Positive and negative predictive  values of testing are highly dependent on prevalence. False negative results are more likely when prevalence of disease is high. (NOTE) The expected result is Negative (Not Detected). The SARS CoV 2 test is intended for the presumptive qualitative  detection of nucleic acid from SARS CoV 2 in upper and lower  respir atory specimens. Testing methodology is real time RT PCR. Test results must be correlated with clinical presentation and  evaluated in the context of other laboratory and epidemiologic data.  Test performance can be affected because the epidemiology and  clinical spectrum of infection caused by SARS CoV 2 is not fully  known. For example, the optimum types of specimens to collect and  when during the course of infection these specimens are most likely  to contain detectable viral RNA may not be  known. This test has not been Food and Drug Administration (FDA) cleared or  approved and has been authorized by FDA under an Emergency Use  Authorization (EUA). The test is only authorized for the duration of  the declaration that circumstances exist justifying the authorization  of emergency use of in vitro diagnostic tests for detection and or  diagnosis of SARS CoV 2 under Section 564(b)(1) of the Act, 21 U.S.C.  section [phone] 3(b)(1), unless the authorization is terminated or   revoked sooner. [facility] is certified under the  Clinical Laboratory Improvement Amendments of 1988 (CLIA), 42 U.S.C.  section [phone], to perform high complexity tests. Performed at [id number] [address], [place], [place] [address] Laboratory Director: Loleta Books, MD Fact Sheet for Healthcare Providers  BankingDealers.co.za Fact Sheet for Patients  StrictlyIdeas.no Performed at [facility], [place] [address], [place], [place] [address]    Coronavirus Source NASOPHARYNGEAL  Final    Comment: Performed at [facility], [address] [place], [place] [address]  Culture, respiratory (tracheal aspirate)     Status: None   Collection Time: 04/01/19  3:36 PM  Result Value Ref Range Status   Specimen Description TRACHEAL ASPIRATE  Final   Special Requests NONE  Final   Gram Stain   Final    ABUNDANT WBC PRESENT, PREDOMINANTLY PMN RARE GRAM POSITIVE COCCI RARE GRAM POSITIVE RODS    Culture   Final    RARE Consistent with normal respiratory flora. Performed at [facility], [place] [address] [place], [place] [address]    Report Status 04/04/2019 FINAL  Final      Radiology Studies: Dg Abd 1 View  Result Date: 04/06/2019 CLINICAL DATA:  Check gastric catheter placement EXAM: ABDOMEN - 1 VIEW COMPARISON:  04/05/2019 FINDINGS: Gastric catheter as been further advanced  into the stomach. Tip lies in the stomach although the proximal side port remains in the distal esophagus. This could be advanced several cm. Degenerative changes of the lumbar spine are noted. Diffuse vascular calcifications are noted. IMPRESSION: Gastric catheter within the stomach as described. This could be advanced several cm. Electronically Signed   By: Inez Catalina M.D.   On: 04/06/2019 00:44   Dg Abd 1 View  Result Date: 04/05/2019 CLINICAL DATA:  78 y/o male NG tube placement. EXAM: ABDOMEN - 1 VIEW COMPARISON:  Plain film of the abdomen from earlier same day. FINDINGS: Enteric tube is now coiled within the lower esophagus. Visualized bowel gas pattern is nonobstructive. No evidence of free intraperitoneal air. Cholecystectomy clips in the RIGHT upper quadrant. IMPRESSION: Enteric tube now coiled within  the lower esophagus. Recommend advancing at least 10 cm. These results will be called to the ordering clinician or representative by the Radiologist Assistant, and communication documented in the PACS or zVision Dashboard. Electronically Signed   By: Franki Cabot M.D.   On: 04/05/2019 17:57   Dg Abd Portable 1v  Result Date: 04/05/2019 CLINICAL DATA:  Bedside OG tube placement. EXAM: PORTABLE ABDOMEN - 1 VIEW COMPARISON:  None. FINDINGS: The OG tube is looped and possibly kinked in the proximal stomach with its tip back up into the distal esophagus. The tube should be withdrawn and readvanced. Visualized bowel gas pattern unremarkable. Aortoiliac atherosclerosis without evidence of aneurysm. IMPRESSION: 1. OG tube is looped and possibly kinked in the proximal stomach with its tip back up into the distal esophagus. The tube should be withdrawn and readvanced. 2. No acute abdominal abnormality. Electronically Signed   By: Evangeline Dakin M.D.   On: 04/05/2019 16:35   Dg Swallowing Func-speech Pathology  Result Date: 04/07/2019 Objective Swallowing Evaluation: Type of Study: MBS-Modified Barium  Swallow Study  Patient Details Name: EMMANUELL KANTZ MRN: [id number] Date of Birth: Sep 13, 1941 Today's Date: 04/07/2019 Time: SLP Start Time (ACUTE ONLY): 1200 -SLP Stop Time (ACUTE ONLY): 1217 SLP Time Calculation (min) (ACUTE ONLY): 17 min Past Medical History: Past Medical History: Diagnosis Date  Alcohol abuse   Allergy   Anxiety   Asthma   COPD (chronic obstructive pulmonary disease) (HCC)   Coronary artery calcification seen on CT scan   Depression   Hyponatremia   Prostate cancer [facility])  Past Surgical History: Past Surgical History: Procedure Laterality Date  EYE SURGERY    PROSTATE SURGERY    SPINE SURGERY   HPI: 78 y.o. male with prior history of COPD, CAD, ETOH/ tobacco abuse, HTN, HLD, chronic hyponatremia presented to [facility] on 4/6 with 2 day history of shortness of breath and cough. Recent hospitalization for acute exacerbation of COPD and severe hyponatremia (3/20-3/23/20). Desaturation requiring BiPAP and was ultimately intubated 4/8-4/11/20. CXR showed L greater than R patchy densities. COVID-19 pending.  Subjective: pt alert, quiet, but appropraite Assessment / Plan / Recommendation CHL IP CLINICAL IMPRESSIONS 04/07/2019 Clinical Impression Pt has a mild pharyngeal dysphagia with sensory and motor deficits that could be explained by recent intubation and generalized deconditioning. His oral phase is functional considering limited dentition, with mildly prolonged, anterior mastication noted as this is where the majority of his teeth are. He has reduced hyolaryngeal movement, epiglottic deflection, and base of tongue retraction, with moderate vallecular residue post-swallow that is most prominent with more solid textures. This also results in penetration during the swallow with thin and nectar thick liquids, but with nectar thick liquid penetration transient in nature. Silent aspiration occurs on larger volumes of thin liquids. Coughing occurred frequently during testing but was not in relation  to airway compromise on any barium, although he did seem to have thick secretions. Given the above as well as importance of conserving energy, recommend starting with Dys 1 diet and nectar thick liquids with good prognosis for advancement with additional time post-extubation and increase in strength of cough/overall cough.  SLP Visit Diagnosis Dysphagia, pharyngeal phase (R13.13) Attention and concentration deficit following -- Frontal lobe and executive function deficit following -- Impact on safety and function Moderate aspiration risk;Mild aspiration risk   CHL IP TREATMENT RECOMMENDATION 04/07/2019 Treatment Recommendations Therapy as outlined in treatment plan below;F/U MBS in --- days (Comment)   Prognosis 04/07/2019 Prognosis for Safe Diet Advancement Good Barriers to  Reach Goals -- Barriers/Prognosis Comment -- CHL IP DIET RECOMMENDATION 04/07/2019 SLP Diet Recommendations Dysphagia 1 (Puree) solids;Nectar thick liquid Liquid Administration via Cup;Straw Medication Administration Crushed with puree Compensations Slow rate;Small sips/bites Postural Changes Seated upright at 90 degrees;Remain semi-upright after after feeds/meals (Comment)   CHL IP OTHER RECOMMENDATIONS 04/07/2019 Recommended Consults -- Oral Care Recommendations Oral care BID Other Recommendations Order thickener from pharmacy;Prohibited food (jello, ice cream, thin soups);Remove water pitcher;Have oral suction available   CHL IP FOLLOW UP RECOMMENDATIONS 04/07/2019 Follow up Recommendations (No Data)   CHL IP FREQUENCY AND DURATION 04/07/2019 Speech Therapy Frequency (ACUTE ONLY) min 2x/week Treatment Duration 2 weeks      CHL IP ORAL PHASE 04/07/2019 Oral Phase WFL Oral - Pudding Teaspoon -- Oral - Pudding Cup -- Oral - Honey Teaspoon -- Oral - Honey Cup -- Oral - Nectar Teaspoon -- Oral - Nectar Cup -- Oral - Nectar Straw -- Oral - Thin Teaspoon -- Oral - Thin Cup -- Oral - Thin Straw -- Oral - Puree -- Oral - Mech Soft -- Oral - Regular -- Oral -  Multi-Consistency -- Oral - Pill -- Oral Phase - Comment --  CHL IP PHARYNGEAL PHASE 04/07/2019 Pharyngeal Phase Impaired Pharyngeal- Pudding Teaspoon -- Pharyngeal -- Pharyngeal- Pudding Cup -- Pharyngeal -- Pharyngeal- Honey Teaspoon -- Pharyngeal -- Pharyngeal- Honey Cup -- Pharyngeal -- Pharyngeal- Nectar Teaspoon -- Pharyngeal -- Pharyngeal- Nectar Cup Reduced epiglottic inversion;Reduced anterior laryngeal mobility;Reduced laryngeal elevation;Reduced airway/laryngeal closure;Reduced tongue base retraction;Pharyngeal residue - valleculae;Penetration/Aspiration during swallow Pharyngeal Material enters airway, remains ABOVE vocal cords then ejected out Pharyngeal- Nectar Straw Reduced epiglottic inversion;Reduced anterior laryngeal mobility;Reduced laryngeal elevation;Reduced airway/laryngeal closure;Reduced tongue base retraction;Pharyngeal residue - valleculae;Penetration/Aspiration during swallow Pharyngeal Material enters airway, remains ABOVE vocal cords then ejected out Pharyngeal- Thin Teaspoon Reduced epiglottic inversion;Reduced anterior laryngeal mobility;Reduced laryngeal elevation;Reduced airway/laryngeal closure;Reduced tongue base retraction Pharyngeal -- Pharyngeal- Thin Cup Reduced epiglottic inversion;Reduced anterior laryngeal mobility;Reduced laryngeal elevation;Reduced airway/laryngeal closure;Reduced tongue base retraction;Penetration/Aspiration during swallow Pharyngeal Material enters airway, remains ABOVE vocal cords and not ejected out Pharyngeal- Thin Straw Reduced epiglottic inversion;Reduced anterior laryngeal mobility;Reduced laryngeal elevation;Reduced airway/laryngeal closure;Reduced tongue base retraction;Pharyngeal residue - valleculae;Penetration/Aspiration during swallow Pharyngeal Material enters airway, passes BELOW cords without attempt by patient to eject out (silent aspiration) Pharyngeal- Puree Reduced epiglottic inversion;Reduced anterior laryngeal mobility;Reduced  laryngeal elevation;Reduced airway/laryngeal closure;Reduced tongue base retraction;Pharyngeal residue - valleculae Pharyngeal -- Pharyngeal- Mechanical Soft Reduced epiglottic inversion;Reduced anterior laryngeal mobility;Reduced laryngeal elevation;Reduced airway/laryngeal closure;Reduced tongue base retraction;Pharyngeal residue - valleculae Pharyngeal -- Pharyngeal- Regular -- Pharyngeal -- Pharyngeal- Multi-consistency -- Pharyngeal -- Pharyngeal- Pill -- Pharyngeal -- Pharyngeal Comment --  CHL IP CERVICAL ESOPHAGEAL PHASE 04/07/2019 Cervical Esophageal Phase Impaired Pudding Teaspoon -- Pudding Cup -- Honey Teaspoon -- Honey Cup -- Nectar Teaspoon -- Nectar Cup -- Nectar Straw -- Thin Teaspoon Esophageal backflow into the pharynx Thin Cup Esophageal backflow into the pharynx Thin Straw Esophageal backflow into the pharynx Puree -- Mechanical Soft -- Regular -- Multi-consistency -- Pill -- Cervical Esophageal Comment -- Venita Sheffield Nix 04/07/2019, 1:40 PM  Pollyann Glen, M.A. CCC-SLP Acute Rehabilitation Services Pager [phone] Office [phone]             US Abdomen Limited Ruq  Result Date: 04/06/2019 CLINICAL DATA:  78 y/o  M; transaminitis. EXAM: ULTRASOUND ABDOMEN LIMITED RIGHT UPPER QUADRANT COMPARISON:  04/05/2019 abdomen radiographs. 04/14/2015 CT abdomen and pelvis. FINDINGS: Gallbladder: Absent gallbladder, surgical clips in gallbladder fossa on prior radiographs and CT compatible with cholecystectomy. No fluid within the  gallbladder fossa. Common bile duct: Diameter: 4.1 mm. Liver: No focal lesion identified. Within normal limits in parenchymal echogenicity. Portal vein is patent on color Doppler imaging with normal direction of blood flow towards the liver. Other: Incidental note of a linear 4 mm echogenic focus within the right interpolar kidney, probably related to vascular calcification within the bilateral renal hilum on prior CT. Right pleural effusion. IMPRESSION: 1. Right pleural  effusion. 2. Cholecystectomy. Otherwise unremarkable right upper quadrant ultrasound. Electronically Signed   By: Kristine Garbe M.D.   On: 04/06/2019 21:04    LOS: 8 days   Time spent: More than 50% of that time was spent in counseling and/or coordination of care.  Antonieta Pert, MD [facility]  04/07/2019, 2:21 PM

## 2019-04-07 NOTE — TOC Progression Note (Signed)
Transition of Care [facility]) - Progression Note    Patient Details  Name: Joshua Whitaker MRN: [id number] Date of Birth: [birth year]  Transition of Care [facility] CM/SW Contact  Sherrilyn Rist Phone Number: [phone] 04/07/2019, 2:14 PM  Clinical Narrative:    CM following for progression of care. Pt transferred out of 2W; extubated 04/04/2019 Lives at home with spouse; goes to [place] for primary care; has private insurance with [facility] with prescription drug coverage.   Expected Discharge Plan: [place] Barriers to Discharge: Continued Medical Work up  Expected Discharge Plan and Services Expected Discharge Plan: [place] arrangements for the past 2 months: Single Family Home                           Social Determinants of Health (SDOH) Interventions    Readmission Risk Interventions No flowsheet data found.

## 2019-04-07 NOTE — Progress Notes (Signed)
Late entry 0830- advised Dr Kathlynn Grate of Manager request on need for foley, he said he would review chart, also advised I contacted sLP and they will do modified barium today

## 2019-04-07 NOTE — Progress Notes (Signed)
Called rehab services to request SLP to come see pt this am and advised Suanne Marker that pt pulled out NGT and family did not want another attempt per night nurse until seen by SLP to see if can advance diet, she says she will talk to staff and see about getting someone up this am

## 2019-04-07 NOTE — Telephone Encounter (Signed)
TC to [place] checking on patient I let her know that [facility] was sent on 03/27/19, it was placed as an order not as a referral so it did not come to the referral workqueue. The agency I sent it to was not covered by his insurance, when I found this out I immediately sent it to another and this is the day he went into the hospital.

## 2019-04-07 NOTE — Telephone Encounter (Signed)
Please refer as pt requests 

## 2019-04-07 NOTE — Progress Notes (Signed)
Attempted to replace [place] slump N/G tube, un able to advance in Right nares and left nares is red and bleeding from Night  MD aware IV fluids continues  Plan for swallow eval and Dietitian reevaluation.

## 2019-04-07 NOTE — Progress Notes (Signed)
Nutrition Follow-up  RD working remotely.  DOCUMENTATION CODES:   Not applicable  INTERVENTION:   -D/c 200 ml free water flush, due to lack of feeding access -Continue with MVI with minerals daily (switched to oral route) -D/c Jevity 1.2, due to lack of feeding access -Ensure Enlive po TID, each supplement provides 350 kcal and 20 grams of protein -Magic Cup TID with meals, each supplement provides 290 kcals and 9 grams protein  NUTRITION DIAGNOSIS:   Inadequate oral intake related to dysphagia as evidenced by NPO status.  Progressing; just advanced to dysphagia 1 diet with thin liquids  GOAL:   Patient will meet greater than or equal to 90% of their needs  Progressing  MONITOR:   PO intake, Supplement acceptance, Diet advancement, Labs, Weight trends, Skin, I & O's  REASON FOR ASSESSMENT:   Consult Enteral/tube feeding initiation and management, Assessment of nutrition requirement/status  ASSESSMENT:   Patient presents to [facility] from home with 2-days increased SOB / COPD exacerbation. Additional hx of HTN, alcohol and tobacco abuse. Transferred to [facility] MICU and intubated on 4/8. R/O COVID-19.  4/8- transferred to [facility] from [facility]; intubated 4/11- extubated 4/12- s/p Palliative Care consult- family desires aggressive measures; s/p BSE- recommend NPO 4/13- NGT placed, TF initiated, COVID-19 negative per MD note; transferred from SDU to floor; pt removed NGT 4/14- s/p MBSS- advanced to dysphagia 1 diet with nectar thick liquids  Reviewed I/O's: -1.8 L x 24 hours and -5.4 L since admission  UOP: 2.1 L x 24 hours  NGT pulled out overnight and did not want to replace due to pending SLP evaluation. Pt was advanced to a dysphagia 1 diet with nectar thick liquids s/p MBSS. RD will add oral nutrition supplements to help pt need nutritional needs given diet restrictions.   Medications reviewed and include solu-medrol.   Labs reviewed: K: 2.9 (on IV supplementation), Phos: 1.9 (on  IV supplementation), Mg WDL. CBGS: 73-149  Diet Order:   Diet Order            DIET - DYS 1 Room service appropriate? Yes with Assist; Fluid consistency: Nectar Thick  Diet effective now              EDUCATION NEEDS:   Not appropriate for education at this time  Skin:  Skin Assessment: Reviewed RN Assessment  Last BM:  04/07/19  Height:   Ht Readings from Last 1 Encounters:  04/01/19 5\' 10"  (1.778 m)    Weight:   Wt Readings from Last 1 Encounters:  04/07/19 58.2 kg    Ideal Body Weight:  75 kg  BMI:  Body mass index is 18.41 kg/m.  Estimated Nutritional Needs:   Kcal:  4540-9811  Protein:  90-105 grams  Fluid:  > 1.7 L    Charde Macfarlane A. Jimmye Norman, RD, LDN, [place] Registered Dietitian II Certified Diabetes Care and Education Specialist Pager: [phone] After hours Pager: [phone]

## 2019-04-07 NOTE — Progress Notes (Signed)
Pt sent to radiology for barium swallow,  Iv fluids infusing without complications, foley emptied, pt has been NPO, pt suctioned prior to leaving floor

## 2019-04-08 DIAGNOSIS — R740 Nonspecific elevation of levels of transaminase and lactic acid dehydrogenase [LDH]: Secondary | ICD-10-CM

## 2019-04-08 DIAGNOSIS — R5381 Other malaise: Secondary | ICD-10-CM

## 2019-04-08 DIAGNOSIS — R531 Weakness: Secondary | ICD-10-CM

## 2019-04-08 DIAGNOSIS — G9341 Metabolic encephalopathy: Secondary | ICD-10-CM

## 2019-04-08 LAB — CBC
HCT: 38.4 % — ABNORMAL LOW (ref 39.0–52.0)
Hemoglobin: 13.3 g/dL (ref 13.0–17.0)
MCH: 29.8 pg (ref 26.0–34.0)
MCHC: 34.6 g/dL (ref 30.0–36.0)
MCV: 86.1 fL (ref 80.0–100.0)
Platelets: 239 10*3/uL (ref 150–400)
RBC: 4.46 MIL/uL (ref 4.22–5.81)
RDW: 13.2 % (ref 11.5–15.5)
WBC: 10.3 10*3/uL (ref 4.0–10.5)
nRBC: 0 % (ref 0.0–0.2)

## 2019-04-08 LAB — COMPREHENSIVE METABOLIC PANEL
ALT: 274 U/L — ABNORMAL HIGH (ref 0–44)
AST: 73 U/L — ABNORMAL HIGH (ref 15–41)
Albumin: 2.4 g/dL — ABNORMAL LOW (ref 3.5–5.0)
Alkaline Phosphatase: 63 U/L (ref 38–126)
Anion gap: 11 (ref 5–15)
BUN: 11 mg/dL (ref 8–23)
CO2: 26 mmol/L (ref 22–32)
Calcium: 7.9 mg/dL — ABNORMAL LOW (ref 8.9–10.3)
Chloride: 99 mmol/L (ref 98–111)
Creatinine, Ser: 0.54 mg/dL — ABNORMAL LOW (ref 0.61–1.24)
GFR calc Af Amer: >60 mL/min (ref >60)
GFR calc non Af Amer: >60 mL/min (ref >60)
Glucose, Bld: 84 mg/dL (ref 70–99)
Potassium: 2.6 mmol/L — CL (ref 3.5–5.1)
Sodium: 136 mmol/L (ref 135–145)
Total Bilirubin: 0.7 mg/dL (ref 0.3–1.2)
Total Protein: 5.1 g/dL — ABNORMAL LOW (ref 6.5–8.1)

## 2019-04-08 LAB — GLUCOSE, CAPILLARY
Glucose-Capillary: 139 mg/dL — ABNORMAL HIGH (ref 70–99)
Glucose-Capillary: 112 mg/dL — ABNORMAL HIGH (ref 70–99)

## 2019-04-08 LAB — PHOSPHORUS: Phosphorus: 2 mg/dL — ABNORMAL LOW (ref 2.5–4.6)

## 2019-04-08 MED ORDER — PANTOPRAZOLE SODIUM 40 MG PO TBEC
40.0000 mg | DELAYED_RELEASE_TABLET | Freq: Every day | ORAL | Status: DC
  Administered 2019-04-08 – 2019-04-10 (×3): 40 mg via ORAL
  Filled 2019-04-08 (×3): qty 1

## 2019-04-08 MED ORDER — POTASSIUM PHOSPHATES 15 MMOLE/5ML IV SOLN
40.0000 meq | Freq: Once | INTRAVENOUS | Status: AC
  Administered 2019-04-08: 40 meq via INTRAVENOUS
  Filled 2019-04-08: qty 9.09

## 2019-04-08 MED ORDER — POTASSIUM CHLORIDE 20 MEQ/15ML (10%) PO SOLN
40.0000 meq | ORAL | Status: AC
  Administered 2019-04-08 (×2): 40 meq via ORAL
  Filled 2019-04-08 (×2): qty 30

## 2019-04-08 MED ORDER — SACCHAROMYCES BOULARDII 250 MG PO CAPS
250.0000 mg | ORAL_CAPSULE | Freq: Two times a day (BID) | ORAL | Status: DC
  Administered 2019-04-09 – 2019-04-10 (×4): 250 mg via ORAL
  Filled 2019-04-08 (×4): qty 1

## 2019-04-08 NOTE — Progress Notes (Signed)
Patient ID: CORDARIUS BENNING, male   DOB: [birth year], 78 y.o.   MRN: [id number]  This NP visited patient at the bedside as a follow up for palliative medicine needs and emotional support for patient and family.  Mr Tietje remains weak and dependant for all ADLs.    His po intake is limited.  He is high risk to decompensate, however he has made some progress.  Mr Bachtel is unable to make medical decisions for himself at this time.   Called placed to daughter/Angela/main contact person.  Continued conversation regarding current medcal situation,  treatment options GOCs, anticipatory care needs.   Family remains hopeful for continued Improvement and hope for Mr Krinke to return to his baseline.   They are open to all offered and available medical interventions to prolong life.  They recognize his need for rehabilitation but are not willing to send him out to SNF 2/2 to Covid-19 but I believe they would be interested in CIR if he is eligible.  Discussed with family  the importance of continued conversation with each other  and the  medical providers regarding overall plan of care and treatment options,  ensuring decisions are within the context of the patients values and GOCs.  Questions and concerns addressed     Discussed with Dr Erlinda Hong and Hassan Rowan [facility]  Total time spent on the unit was 35 minute      This nurse practitioner informed  the patient/family and the attending that I will be out of the hospital until Monday morning.  If the patient is still hospitalized I will follow-up at that time.  Call palliative medicine team phone # [phone] with questions or concerns.  Greater than 50% of the time was spent in counseling and coordination of care  Wadie Lessen NP  Palliative Medicine Team Team Phone # [phone] Pager [phone]

## 2019-04-08 NOTE — Progress Notes (Signed)
SLP Cancellation Note  Patient Details Name: Joshua Whitaker MRN: [id number] DOB: [birth year]   Cancelled treatment:       Reason Eval/Treat Not Completed: Other (comment)(pt being helped to the bathroom by NT, will continue efforts)   Macario Golds 04/08/2019, 10:49 AM  Luanna Salk, [place] SLP [place] Pager [phone] Office [phone]

## 2019-04-08 NOTE — Evaluation (Signed)
Physical Therapy Evaluation Patient Details Name: Joshua Whitaker MRN: [id number] DOB: [birth year] Today's Date: 04/08/2019   History of Present Illness  78 y.o. male, with history of COPD, alcohol abuse, tobacco abuse, hypertension, hyponatremia was brought to the ED for shortness of breath. Patient was recently admitted to the hospital for concern for MI at that time he did not have cardiac catheterization. In ED patient was found to be in new onset atrial flutter, chest x-ray showed possibility of pneumonia. Developed respiratory failure intubated 4/7-4/11  Clinical Impression  Per daughter [name], since last hospitalization pt was ambulating short distances with AD and wife was assisting with bathing and dressing. Pt is currently limited in safe mobility by decreased strength, balance and endurance. Pt requires maxA for bed mobility, and transfers and modA for taking lateral steps with posterior support on LE from bed, pt unable to step away from bed and maintain balance. OT spoke with [name] on phone and she is open to possibility of CIR placement and will provide 24 hour support when he comes home, that given with pt's PLOF and need for PT/OT/SLP makes him a good candidate for CIR level rehab. PT will continue to follow acutely.     Follow Up Recommendations CIR    Equipment Recommendations  Other (comment)(TBD at next venue)    Recommendations for Other Services Rehab consult     Precautions / Restrictions Precautions Precautions: Fall Restrictions Weight Bearing Restrictions: No      Mobility  Bed Mobility Overal bed mobility: Needs Assistance Bed Mobility: Supine to Sit;Sit to Supine     Supine to sit: Mod assist;Max assist Sit to supine: Mod assist   General bed mobility comments: Pt able to initiate supine to sit with core activation but quickly reaches out for assist to come to fully upright, requires increased time and effort and maximal cuing to balance EoB and is  unable to maintain without pulling on RW in front of him   Transfers Overall transfer level: Needs assistance Equipment used: Rolling walker (2 wheeled) Transfers: Sit to/from Stand Sit to Stand: Max assist;From elevated surface;Mod assist;+2 physical assistance         General transfer comment: maxA with assist of pad under bottom, to power up from elevated surface, with standing pt unable to balance without posterior support on LE, with rest after standing pt able to sit>stand 3 more times with modA and use of momentum   Ambulation/Gait Ambulation/Gait assistance: Mod assist;+2 physical assistance Gait Distance (Feet): 3 Feet Assistive device: Rolling walker (2 wheeled) Gait Pattern/deviations: Step-to pattern;Shuffle;Trunk flexed Gait velocity: slowed Gait velocity interpretation: <1.31 ft/sec, indicative of household ambulator General Gait Details: modAx2 for steadying, pt only able to take lateral steps towards HoB with continued support posteriorly on LE from bed        Balance Overall balance assessment: Needs assistance Sitting-balance support: Feet supported;Bilateral upper extremity supported Sitting balance-Leahy Scale: Poor Sitting balance - Comments: requires external support by pulling back against RW Postural control: Posterior lean Standing balance support: During functional activity;Bilateral upper extremity supported Standing balance-Leahy Scale: Zero Standing balance comment: requires outside assist to maintain standing balance                             Pertinent Vitals/Pain Pain Assessment: No/denies pain    Home Living Family/patient expects to be discharged to:: Private residence Living Arrangements: Spouse/significant other Available Help at Discharge: Family Type of Home: House  Home Access: Level entry     Home Layout: Multi-level;Able to live on main level with bedroom/bathroom Home Equipment: Kasandra Knudsen - single point;Bedside commode       Prior Function Level of Independence: Needs assistance   Gait / Transfers Assistance Needed: ambulated with RW  ADL's / Homemaking Assistance Needed: assist from wife for bathing and dressing            Extremity/Trunk Assessment   Upper Extremity Assessment Upper Extremity Assessment: Generalized weakness    Lower Extremity Assessment Lower Extremity Assessment: Generalized weakness    Cervical / Trunk Assessment Cervical / Trunk Assessment: Kyphotic  Communication   Communication: (slow processing)  Cognition Arousal/Alertness: Awake/alert Behavior During Therapy: Flat affect Overall Cognitive Status: Impaired/Different from baseline Area of Impairment: Memory;Following commands;Safety/judgement;Awareness;Problem solving                     Memory: Decreased short-term memory Following Commands: Follows multi-step commands inconsistently;Follows multi-step commands with increased time Safety/Judgement: Decreased awareness of deficits Awareness: Intellectual Problem Solving: Slow processing;Decreased initiation;Difficulty sequencing;Requires verbal cues;Requires tactile cues        General Comments General comments (skin integrity, edema, etc.): Pt with increased DOE with activity, SaO2 on RA 94%O2      Assessment/Plan    PT Assessment Patient needs continued PT services  PT Problem List Decreased strength;Decreased activity tolerance;Decreased balance;Decreased mobility;Decreased cognition;Decreased safety awareness;Decreased knowledge of precautions;Cardiopulmonary status limiting activity       PT Treatment Interventions DME instruction;Gait training;Functional mobility training;Therapeutic activities;Therapeutic exercise;Balance training;Cognitive remediation;Patient/family education    PT Goals (Current goals can be found in the Care Plan section)  Acute Rehab PT Goals Patient Stated Goal: go home tomorrow PT Goal Formulation: With  patient/family Time For Goal Achievement: 04/22/19 Potential to Achieve Goals: Fair    Frequency Min 4X/week        Co-evaluation PT/OT/SLP Co-Evaluation/Treatment: Yes Reason for Co-Treatment: Necessary to address cognition/behavior during functional activity PT goals addressed during session: Mobility/safety with mobility OT goals addressed during session: ADL's and self-care       AM-PAC PT "6 Clicks" Mobility  Outcome Measure Help needed turning from your back to your side while in a flat bed without using bedrails?: Total Help needed moving from lying on your back to sitting on the side of a flat bed without using bedrails?: Total Help needed moving to and from a bed to a chair (including a wheelchair)?: Total Help needed standing up from a chair using your arms (e.g., wheelchair or bedside chair)?: Total Help needed to walk in hospital room?: Total Help needed climbing 3-5 steps with a railing? : Total 6 Click Score: 6    End of Session Equipment Utilized During Treatment: Gait belt Activity Tolerance: Patient limited by fatigue Patient left: in bed;with call bell/phone within reach;with bed alarm set Nurse Communication: Mobility status;Precautions PT Visit Diagnosis: Unsteadiness on feet (R26.81);Other abnormalities of gait and mobility (R26.89);Muscle weakness (generalized) (M62.81);Difficulty in walking, not elsewhere classified (R26.2)    Time: 6063-0160 PT Time Calculation (min) (ACUTE ONLY): 32 min   Charges:   PT Evaluation $PT Eval Moderate Complexity: 1 Mod          Miloh Alcocer B. Migdalia Dk PT, DPT Acute Rehabilitation Services Pager [phone] Office [phone]   [place] 04/08/2019, 3:08 PM

## 2019-04-08 NOTE — Consult Note (Signed)
Physical Medicine and Rehabilitation Consult   Reason for Consult:Debility.  Referring Physician: Dr. Erlinda Hong   HPI: Joshua Whitaker is a 78 y.o. male with history of COPD, CAD, EtOH/tobacco abuse, chronic hyponatremia, recent admission 3/20-3/23 for AECOPD with severe hyponateremia and was discharged home on steroid taper.  He was readmitted to [facility] on 03/30/19 with 2-day history of cough and shortness of breath.  He developed respiratory failure requiring intubation on 4/7 and was transferred to [facility] to rule out COVID-19.  Acute on chronic respiratory failure with hypoxia felt to be secondary to COPD exacerbation and HCAP with likely aspiration component as COVID-19 test negative.  Hospital course significant for   A flutter with elevated troponin and was started on IV Cardizem for rate control.  Recent echo with EF 50-55% with small to moderate pericardial effusion and work up deferred as hemodynamically stable. He was started on treatment dose Lovenox with plans to transition to Eliquis. C  Cardiology recommends repeat echo in 1 month and 14 day Zio monitor to monitor for arrhthymias.  He tolerated extubation on 4/11 and respiratory status has been stable--continues on IV Unasyn. GOC discussed with family due to reports of declining at home and has elected on full scope of treatment.  MBS 4/15 revealing mild oropharyngeal dysphagia with silent aspiration of thins therefore placed on dysphagia 1, nectar liquids.  Therapy evaluations completed and patient noted to be severely deconditioned. CIR recommended due to functional decline.    Review of Systems  Unable to perform ROS: Mental acuity  Constitutional: Negative for chills and fever.  HENT: Negative for hearing loss.   Respiratory: Positive for cough and shortness of breath.   Cardiovascular: Negative for chest pain and orthopnea.  Gastrointestinal: Negative for heartburn.       Per records--has been incontinent of B/B since last  discharge.   Genitourinary: Negative for dysuria.  Musculoskeletal: Negative for myalgias.  Skin: Negative for rash.  Neurological: Positive for weakness. Negative for dizziness and headaches.  Psychiatric/Behavioral: Positive for memory loss.     Past Medical History:  Diagnosis Date   Alcohol abuse    Allergy    Anxiety    Asthma    COPD (chronic obstructive pulmonary disease) (HCC)    Coronary artery calcification seen on CT scan    Depression    Hyponatremia    Prostate cancer [facility])     Past Surgical History:  Procedure Laterality Date   EYE SURGERY     PROSTATE SURGERY     SPINE SURGERY      Family History  Problem Relation Age of Onset   Heart disease Mother    Arthritis Mother    Heart disease Father    Cancer Sister        kidney   Cancer Brother        lung   Cancer Brother        lung   Stroke Brother    Cancer Brother        prostate   Cancer Sister        colon   Cancer Sister        bone   Diabetes Sister    Chromosomal disorder Sister    Alcohol abuse Daughter    Rheum arthritis Daughter    Rheum arthritis Daughter     Social History:  Married. Was requiring assistance with mobility PTA per reports. He reports that he has been smoking cigarettes. He has  been smoking about 1.00 pack per day--cut down from 1.5 PPD?  He has never used smokeless tobacco. Per reports current alcohol use. Per reports he does not use drugs.   Allergies  Allergen Reactions   Tiotropium Bromide Monohydrate Other (See Comments)    Severe urinary retention   Varenicline Other (See Comments)    Psychiatric and dreams   Iodinated Diagnostic Agents    Statins    Medications Prior to Admission  Medication Sig Dispense Refill   aspirin 325 MG tablet Take 650 mg by mouth daily as needed for mild pain.      atorvastatin (LIPITOR) 80 MG tablet Take 1 tablet (80 mg total) by mouth daily at 6 PM. 90 tablet 1   clopidogrel (PLAVIX) 75 MG  tablet Take 1 tablet (75 mg total) by mouth daily. 30 tablet 0   escitalopram (LEXAPRO) 5 MG tablet Take 5 mg by mouth every morning.     fluticasone (FLONASE) 50 MCG/ACT nasal spray Place 1 spray into both nostrils daily. 16 g 5   Fluticasone-Umeclidin-Vilant (TRELEGY ELLIPTA) 100-62.5-25 MCG/INH AEPB Inhale 1 puff into the lungs daily. 28 each 5   folic acid (FOLVITE) 1 MG tablet Take 1 tablet (1 mg total) by mouth daily. 30 tablet 0   Ipratropium-Albuterol (COMBIVENT RESPIMAT) 20-100 MCG/ACT AERS respimat Inhale 2 puffs into the lungs every 6 (six) hours. 1 Inhaler 5   megestrol (MEGACE) 400 MG/10ML suspension Take 10 mLs (400 mg total) by mouth 2 (two) times daily. For appetite stimulation 600 mL 2   predniSONE (DELTASONE) 10 MG tablet Take 5 daily for 3 days followed by 4,3,2 and 1 for 3 days each. 45 tablet 0   PROAIR HFA 108 (90 Base) MCG/ACT inhaler Inhale 1-2 puffs into the lungs every 6 (six) hours as needed for wheezing or shortness of breath. (Patient taking differently: Inhale 1-2 puffs into the lungs every 6 (six) hours as needed for wheezing or shortness of breath. ) 8.5 g 2   thiamine 100 MG tablet Take 1 tablet (100 mg total) by mouth daily. 30 tablet 0    Home: Home Living Family/patient expects to be discharged to:: Private residence Living Arrangements: Spouse/significant other Available Help at Discharge: Family Type of Home: House Home Access: Level entry Home Layout: Multi-level, Able to live on main level with bedroom/bathroom Alternate Level Stairs-Number of Steps: flight Bathroom Shower/Tub: Chiropodist: Standard Home Equipment: Cane - single point, Bedside commode Additional Comments: Daughter lives about 25 min away, has 2 grown sons who can help as well. Pt and his wife live in an old farmhouse that is heated by a wood stove.  Functional History: Prior Function Level of Independence: Needs assistance Gait / Transfers Assistance  Needed: ambulated with RW ADL's / Homemaking Assistance Needed: assist from wife for bathing and dressing  Comments: Daughter's biggest concern is getting Dad to eat so that he can get better -improve energy and strength Functional Status:  Mobility: Bed Mobility Overal bed mobility: Needs Assistance Bed Mobility: Supine to Sit, Sit to Supine Supine to sit: Mod assist, Max assist Sit to supine: Mod assist General bed mobility comments: Pt able to initiate supine to sit with core activation but quickly reaches out for assist to come to fully upright, requires increased time and effort and maximal cuing to balance EoB and is unable to maintain without pulling on RW in front of him  Transfers Overall transfer level: Needs assistance Equipment used: Rolling walker (2 wheeled) Transfers:  Sit to/from Stand Sit to Stand: Max assist, From elevated surface, Mod assist, +2 physical assistance General transfer comment: maxA with assist of pad under bottom, to power up from elevated surface, with standing pt unable to balance without posterior support on LE, with rest after standing pt able to sit>stand 3 more times with modA and use of momentum  Ambulation/Gait Ambulation/Gait assistance: Mod assist, +2 physical assistance Gait Distance (Feet): 3 Feet Assistive device: Rolling walker (2 wheeled) Gait Pattern/deviations: Step-to pattern, Shuffle, Trunk flexed General Gait Details: modAx2 for steadying, pt only able to take lateral steps towards HoB with continued support posteriorly on LE from bed Gait velocity: slowed Gait velocity interpretation: <1.31 ft/sec, indicative of household ambulator    ADL: ADL Overall ADL's : Needs assistance/impaired Eating/Feeding: Maximal assistance Grooming: Moderate assistance, Sitting Grooming Details (indicate cue type and reason): requires supported sitting, or BUE support for grooming tasks Upper Body Bathing: Moderate assistance Lower Body Bathing:  Maximal assistance Upper Body Dressing : Moderate assistance Lower Body Dressing: Maximal assistance Lower Body Dressing Details (indicate cue type and reason): Pt unable to take off socks at this time Toilet Transfer: Maximal assistance, +2 for physical assistance, +2 for safety/equipment, BSC, RW Toilet Transfer Details (indicate cue type and reason): when Pt does not have support behind his legs he has zero balance Toileting- Clothing Manipulation and Hygiene: Maximal assistance Tub/ Shower Transfer: Maximal assistance, +2 for physical assistance, +2 for safety/equipment Functional mobility during ADLs: Maximal assistance, +2 for physical assistance, +2 for safety/equipment, Rolling walker, Cueing for safety, Cueing for sequencing General ADL Comments: Pt demonstrates deficits in cognition, balance, also demonstrates generalized weakness and deconditioning impacting ability to perform ADL  Cognition: Cognition Overall Cognitive Status: Impaired/Different from baseline Orientation Level: Oriented to person, Oriented to situation, Disoriented to time, Disoriented to place Cognition Arousal/Alertness: Awake/alert Behavior During Therapy: Flat affect Overall Cognitive Status: Impaired/Different from baseline Area of Impairment: Memory, Following commands, Safety/judgement, Awareness, Problem solving Memory: Decreased short-term memory Following Commands: Follows multi-step commands inconsistently, Follows multi-step commands with increased time Safety/Judgement: Decreased awareness of deficits Awareness: Intellectual Problem Solving: Slow processing, Decreased initiation, Difficulty sequencing, Requires verbal cues, Requires tactile cues General Comments: Pt seems to have improved cognition from previous sessions with SLP and other medical staff, but remains confused, able to follow simple commands with increased time.    Blood pressure (!) 142/75, pulse 76, temperature 98.8 F (37.1 C),  temperature source Oral, resp. rate 18, height 5\' 10"  (1.778 m), weight 58.1 kg, SpO2 95 %. Physical Exam  Nursing note and vitals reviewed. Constitutional: He appears well-developed. No distress.  Thin, ill appearing male, reporting malaise and SOB. Pulse ox 95% on RA.   HENT:  Head: Normocephalic.  Eyes: Pupils are equal, round, and reactive to light.  Neck: Normal range of motion.  Cardiovascular: Normal rate.  Respiratory: No accessory muscle usage. No respiratory distress. He has rales.  Sl dyspneic with conversation  GI: Soft.  Musculoskeletal:        General: No edema.  Neurological: He is alert. No cranial nerve deficit.  Oriented to self. Disoriented--he was unable to recall reason for admit and thought that he was at [facility]. Follows basic commands. Normal language. UE 4/5 prox to distal. LE: 3+ HF, KE and 4/5 ADF/PF. No gross sensory findings  Skin: Skin is warm. He is not diaphoretic.  Psychiatric:  Flat but cooperative    Results for orders placed or performed during the hospital encounter of 03/30/19 (from the  past 24 hour(s))  Glucose, capillary     Status: None   Collection Time: 04/07/19  8:20 PM  Result Value Ref Range   Glucose-Capillary 95 70 - 99 mg/dL  CBC     Status: Abnormal   Collection Time: 04/08/19  6:00 AM  Result Value Ref Range   WBC 10.3 4.0 - 10.5 K/uL   RBC 4.46 4.22 - 5.81 MIL/uL   Hemoglobin 13.3 13.0 - 17.0 g/dL   HCT 38.4 (L) 39.0 - 52.0 %   MCV 86.1 80.0 - 100.0 fL   MCH 29.8 26.0 - 34.0 pg   MCHC 34.6 30.0 - 36.0 g/dL   RDW 13.2 11.5 - 15.5 %   Platelets 239 150 - 400 K/uL   nRBC 0.0 0.0 - 0.2 %  Comprehensive metabolic panel     Status: Abnormal   Collection Time: 04/08/19  6:00 AM  Result Value Ref Range   Sodium 136 135 - 145 mmol/L   Potassium 2.6 (LL) 3.5 - 5.1 mmol/L   Chloride 99 98 - 111 mmol/L   CO2 26 22 - 32 mmol/L   Glucose, Bld 84 70 - 99 mg/dL   BUN 11 8 - 23 mg/dL   Creatinine, Ser 0.54 (L) 0.61 - 1.24 mg/dL    Calcium 7.9 (L) 8.9 - 10.3 mg/dL   Total Protein 5.1 (L) 6.5 - 8.1 g/dL   Albumin 2.4 (L) 3.5 - 5.0 g/dL   AST 73 (H) 15 - 41 U/L   ALT 274 (H) 0 - 44 U/L   Alkaline Phosphatase 63 38 - 126 U/L   Total Bilirubin 0.7 0.3 - 1.2 mg/dL   GFR calc non Af Amer >60 >60 mL/min   GFR calc Af Amer >60 >60 mL/min   Anion gap 11 5 - 15  Phosphorus     Status: Abnormal   Collection Time: 04/08/19  6:00 AM  Result Value Ref Range   Phosphorus 2.0 (L) 2.5 - 4.6 mg/dL  Glucose, capillary     Status: Abnormal   Collection Time: 04/08/19 12:17 PM  Result Value Ref Range   Glucose-Capillary 139 (H) 70 - 99 mg/dL   Dg Swallowing Func-speech Pathology  Result Date: 04/07/2019 Objective Swallowing Evaluation: Type of Study: MBS-Modified Barium Swallow Study  Patient Details Name: TAYLON COOLE MRN: [id number] Date of Birth: 04-29-1941 Today's Date: 04/07/2019 Time: SLP Start Time (ACUTE ONLY): 1200 -SLP Stop Time (ACUTE ONLY): 1217 SLP Time Calculation (min) (ACUTE ONLY): 17 min Past Medical History: Past Medical History: Diagnosis Date  Alcohol abuse   Allergy   Anxiety   Asthma   COPD (chronic obstructive pulmonary disease) (HCC)   Coronary artery calcification seen on CT scan   Depression   Hyponatremia   Prostate cancer (HCC)  Past Surgical History: Past Surgical History: Procedure Laterality Date  EYE SURGERY    PROSTATE SURGERY    SPINE SURGERY   HPI: 78 y.o. male with prior history of COPD, CAD, ETOH/ tobacco abuse, HTN, HLD, chronic hyponatremia presented to [facility] on 4/6 with 2 day history of shortness of breath and cough. Recent hospitalization for acute exacerbation of COPD and severe hyponatremia (3/20-3/23/20). Desaturation requiring BiPAP and was ultimately intubated 4/8-4/11/20. CXR showed L greater than R patchy densities. COVID-19 pending.  Subjective: pt alert, quiet, but appropraite Assessment / Plan / Recommendation CHL IP CLINICAL IMPRESSIONS 04/07/2019 Clinical Impression Pt has a mild  pharyngeal dysphagia with sensory and motor deficits that could be explained by recent intubation and  generalized deconditioning. His oral phase is functional considering limited dentition, with mildly prolonged, anterior mastication noted as this is where the majority of his teeth are. He has reduced hyolaryngeal movement, epiglottic deflection, and base of tongue retraction, with moderate vallecular residue post-swallow that is most prominent with more solid textures. This also results in penetration during the swallow with thin and nectar thick liquids, but with nectar thick liquid penetration transient in nature. Silent aspiration occurs on larger volumes of thin liquids. Coughing occurred frequently during testing but was not in relation to airway compromise on any barium, although he did seem to have thick secretions. Given the above as well as importance of conserving energy, recommend starting with Dys 1 diet and nectar thick liquids with good prognosis for advancement with additional time post-extubation and increase in strength of cough/overall cough.  SLP Visit Diagnosis Dysphagia, pharyngeal phase (R13.13) Attention and concentration deficit following -- Frontal lobe and executive function deficit following -- Impact on safety and function Moderate aspiration risk;Mild aspiration risk   CHL IP TREATMENT RECOMMENDATION 04/07/2019 Treatment Recommendations Therapy as outlined in treatment plan below;F/U MBS in --- days (Comment)   Prognosis 04/07/2019 Prognosis for Safe Diet Advancement Good Barriers to Reach Goals -- Barriers/Prognosis Comment -- CHL IP DIET RECOMMENDATION 04/07/2019 SLP Diet Recommendations Dysphagia 1 (Puree) solids;Nectar thick liquid Liquid Administration via Cup;Straw Medication Administration Crushed with puree Compensations Slow rate;Small sips/bites Postural Changes Seated upright at 90 degrees;Remain semi-upright after after feeds/meals (Comment)   CHL IP OTHER RECOMMENDATIONS  04/07/2019 Recommended Consults -- Oral Care Recommendations Oral care BID Other Recommendations Order thickener from pharmacy;Prohibited food (jello, ice cream, thin soups);Remove water pitcher;Have oral suction available   CHL IP FOLLOW UP RECOMMENDATIONS 04/07/2019 Follow up Recommendations (No Data)   CHL IP FREQUENCY AND DURATION 04/07/2019 Speech Therapy Frequency (ACUTE ONLY) min 2x/week Treatment Duration 2 weeks      CHL IP ORAL PHASE 04/07/2019 Oral Phase WFL Oral - Pudding Teaspoon -- Oral - Pudding Cup -- Oral - Honey Teaspoon -- Oral - Honey Cup -- Oral - Nectar Teaspoon -- Oral - Nectar Cup -- Oral - Nectar Straw -- Oral - Thin Teaspoon -- Oral - Thin Cup -- Oral - Thin Straw -- Oral - Puree -- Oral - Mech Soft -- Oral - Regular -- Oral - Multi-Consistency -- Oral - Pill -- Oral Phase - Comment --  CHL IP PHARYNGEAL PHASE 04/07/2019 Pharyngeal Phase Impaired Pharyngeal- Pudding Teaspoon -- Pharyngeal -- Pharyngeal- Pudding Cup -- Pharyngeal -- Pharyngeal- Honey Teaspoon -- Pharyngeal -- Pharyngeal- Honey Cup -- Pharyngeal -- Pharyngeal- Nectar Teaspoon -- Pharyngeal -- Pharyngeal- Nectar Cup Reduced epiglottic inversion;Reduced anterior laryngeal mobility;Reduced laryngeal elevation;Reduced airway/laryngeal closure;Reduced tongue base retraction;Pharyngeal residue - valleculae;Penetration/Aspiration during swallow Pharyngeal Material enters airway, remains ABOVE vocal cords then ejected out Pharyngeal- Nectar Straw Reduced epiglottic inversion;Reduced anterior laryngeal mobility;Reduced laryngeal elevation;Reduced airway/laryngeal closure;Reduced tongue base retraction;Pharyngeal residue - valleculae;Penetration/Aspiration during swallow Pharyngeal Material enters airway, remains ABOVE vocal cords then ejected out Pharyngeal- Thin Teaspoon Reduced epiglottic inversion;Reduced anterior laryngeal mobility;Reduced laryngeal elevation;Reduced airway/laryngeal closure;Reduced tongue base retraction Pharyngeal --  Pharyngeal- Thin Cup Reduced epiglottic inversion;Reduced anterior laryngeal mobility;Reduced laryngeal elevation;Reduced airway/laryngeal closure;Reduced tongue base retraction;Penetration/Aspiration during swallow Pharyngeal Material enters airway, remains ABOVE vocal cords and not ejected out Pharyngeal- Thin Straw Reduced epiglottic inversion;Reduced anterior laryngeal mobility;Reduced laryngeal elevation;Reduced airway/laryngeal closure;Reduced tongue base retraction;Pharyngeal residue - valleculae;Penetration/Aspiration during swallow Pharyngeal Material enters airway, passes BELOW cords without attempt by patient to eject out (silent aspiration) Pharyngeal- Puree Reduced  epiglottic inversion;Reduced anterior laryngeal mobility;Reduced laryngeal elevation;Reduced airway/laryngeal closure;Reduced tongue base retraction;Pharyngeal residue - valleculae Pharyngeal -- Pharyngeal- Mechanical Soft Reduced epiglottic inversion;Reduced anterior laryngeal mobility;Reduced laryngeal elevation;Reduced airway/laryngeal closure;Reduced tongue base retraction;Pharyngeal residue - valleculae Pharyngeal -- Pharyngeal- Regular -- Pharyngeal -- Pharyngeal- Multi-consistency -- Pharyngeal -- Pharyngeal- Pill -- Pharyngeal -- Pharyngeal Comment --  CHL IP CERVICAL ESOPHAGEAL PHASE 04/07/2019 Cervical Esophageal Phase Impaired Pudding Teaspoon -- Pudding Cup -- Honey Teaspoon -- Honey Cup -- Nectar Teaspoon -- Nectar Cup -- Nectar Straw -- Thin Teaspoon Esophageal backflow into the pharynx Thin Cup Esophageal backflow into the pharynx Thin Straw Esophageal backflow into the pharynx Puree -- Mechanical Soft -- Regular -- Multi-consistency -- Pill -- Cervical Esophageal Comment -- Venita Sheffield Nix 04/07/2019, 1:40 PM  Pollyann Glen, M.A. CCC-SLP Acute Rehabilitation Services Pager [phone] Office [phone]             US Abdomen Limited Ruq  Result Date: 04/06/2019 CLINICAL DATA:  78 y/o  M; transaminitis. EXAM: ULTRASOUND ABDOMEN  LIMITED RIGHT UPPER QUADRANT COMPARISON:  04/05/2019 abdomen radiographs. 04/14/2015 CT abdomen and pelvis. FINDINGS: Gallbladder: Absent gallbladder, surgical clips in gallbladder fossa on prior radiographs and CT compatible with cholecystectomy. No fluid within the gallbladder fossa. Common bile duct: Diameter: 4.1 mm. Liver: No focal lesion identified. Within normal limits in parenchymal echogenicity. Portal vein is patent on color Doppler imaging with normal direction of blood flow towards the liver. Other: Incidental note of a linear 4 mm echogenic focus within the right interpolar kidney, probably related to vascular calcification within the bilateral renal hilum on prior CT. Right pleural effusion. IMPRESSION: 1. Right pleural effusion. 2. Cholecystectomy. Otherwise unremarkable right upper quadrant ultrasound. Electronically Signed   By: Kristine Garbe M.D.   On: 04/06/2019 21:04     Assessment/Plan: Diagnosis: debility and encephalopathy related to acute on chronic respiratory failure as well as multiple other medical complications 1. Does the need for close, 24 hr/day medical supervision in concert with the patient's rehab needs make it unreasonable for this patient to be served in a less intensive setting? Yes 2. Co-Morbidities requiring supervision/potential complications: COPD, CAD, atrial flutter, HCAP 3. Due to bladder management, bowel management, safety, skin/wound care, disease management, medication administration, pain management and patient education, does the patient require 24 hr/day rehab nursing? Yes 4. Does the patient require coordinated care of a physician, rehab nurse, PT (1-2 hrs/day, 5 days/week), OT (1-2 hrs/day, 5 days/week) and SLP (1-2 hrs/day, 5 days/week) to address physical and functional deficits in the context of the above medical diagnosis(es)? Yes Addressing deficits in the following areas: balance, endurance, locomotion, strength, transferring,  bowel/bladder control, bathing, dressing, feeding, grooming, toileting, cognition and psychosocial support 5. Can the patient actively participate in an intensive therapy program of at least 3 hrs of therapy per day at least 5 days per week? Yes 6. The potential for patient to make measurable gains while on inpatient rehab is excellent 7. Anticipated functional outcomes upon discharge from inpatient rehab are modified independent and supervision  with PT, modified independent and supervision with OT, modified independent and supervision with SLP. 8. Estimated rehab length of stay to reach the above functional goals is: 11-16 days 9. Anticipated D/C setting: Home 10. Anticipated post D/C treatments: [place] therapy 11. Overall Rehab/Functional Prognosis: excellent  RECOMMENDATIONS: This patient's condition is appropriate for continued rehabilitative care in the following setting: CIR Patient has agreed to participate in recommended program. Yes Note that insurance prior authorization may be required for reimbursement  for recommended care.  Comment: Rehab Admissions Coordinator to follow up.  Thanks,  Meredith Staggers, MD, Mellody Drown  I have personally performed a face to face diagnostic evaluation of this patient. Additionally, I have examined pertinent labs and radiographic images. I have reviewed and concur with the physician assistant's documentation above.    Bary Leriche, PA-C 04/08/2019

## 2019-04-08 NOTE — Progress Notes (Signed)
Potassium 2.6 this morning, MD paged.

## 2019-04-08 NOTE — TOC Benefit Eligibility Note (Signed)
Transition of Care [facility]) Benefit Eligibility Note    Patient Details  Name: Joshua Whitaker MRN: [id number] Date of Birth: Feb 17, 1941   Medication/Dose: Arne Cleveland  5 MG BID(APIXABAN : NON-FORMULARY)  Covered?: Yes  Tier: 3 Drug  Prescription Coverage Preferred Pharmacy: YES([facility] AND [facility])  Spoke with Person/Company/Phone Number:: STEPHANIE([employer] # [phone])  Co-Pay: 100 % OF TOTAL COST     Deductible: Met(OUT-OF-POCKET: NOT MET)       Memory Argue Phone Number: 04/08/2019, 3:38 PM

## 2019-04-08 NOTE — Progress Notes (Signed)
Rehab Admissions Coordinator Note:  Per PT recommendation, this patient was screened by Jhonnie Garner for appropriateness for an Inpatient Acute Rehab Consult.  At this time, we are recommending an Inpatient Rehab consult.  AC will contact MD to request an IP Rehab Consult Order.   Jhonnie Garner 04/08/2019, 3:47 PM  I can be reached at [phone].

## 2019-04-08 NOTE — Progress Notes (Signed)
[place] choice offered, pt chose [facility] [place] services; Aneta Mins [phone]    [place]    [phone]   [place] to my Favorites Quality of Patient Care Rating4 out of 5 stars Patient Survey Summary Rating4 out of [place] [facility]  [phone]   [place] to my Favorites Quality of Patient Care Rating4 out of 5 stars Patient Survey Summary Rating2 out of 5 stars  [place]  [phone]   [place] to my Favorites Quality of Patient Care Rating4  out of 5 stars Patient Survey Summary Rating3 out of [place]  [phone]   [place] to my [place] out of 5 stars Patient [place] out of [place]  [phone]) [phone]   [place] to my [place] out of 5 stars Patient Survey Summary [place] out of [place]  [phone]

## 2019-04-08 NOTE — TOC Progression Note (Signed)
Transition of Care [facility]) - Progression Note    Patient Details  Name: Joshua Whitaker MRN: [id number] Date of Birth: 1941-02-24  Transition of Care [facility]) CM/SW Contact  Sherrilyn Rist Phone Number: [phone] 04/08/2019, 2:06 PM  Clinical Narrative:    CM talked to patient for [facility] choice, he chose [facility] [place]; Dan with [facility] called for arrangements. CM talked to patient about his PCP, he goes to [facility] and states that he wanted to continue to go there. He is also agreeable to Outpatient Palliative Care services, choice offered, he chose [facility]. Bambi with [facility] called.   Expected Discharge Plan: [place] Barriers to Discharge: No Barriers Identified  Expected Discharge Plan and Services Expected Discharge Plan: [place] In-house Referral: NA Discharge Planning Services: CM Consult Post Acute Care Choice: NA Living arrangements for the past 2 months: Single Family Home                 DME Arranged: N/A DME Agency: NA HH Arranged: RN, PT, OT, Nurse's Aide [place] Agency: [place] ([facility])   Social Determinants of Health (SDOH) Interventions    Readmission Risk Interventions No flowsheet data found.

## 2019-04-08 NOTE — Evaluation (Signed)
Occupational Therapy Evaluation Patient Details Name: Joshua Whitaker MRN: [id number] DOB: 23-Apr-1941 Today's Date: 04/08/2019    History of Present Illness 78 y.o. male, with history of COPD, alcohol abuse, tobacco abuse, hypertension, hyponatremia was brought to the ED for shortness of breath. Patient was recently admitted to the hospital for concern for MI at that time he did not have cardiac catheterization. In ED patient was found to be in new onset atrial flutter, chest x-ray showed possibility of pneumonia. Developed respiratory failure intubated 4/7-4/11   Clinical Impression   PTA Pt required assist for ADL (bathing and dressing) and transfers (used a SPC PRN). This is since his recent hospitalization in March. Prior to that he was independent in ADL and transfers. Currently he presents with generalized weakness, deficits in balance, cognition, and overall deconditioned. He is max A for LB ADL and mod A for UB ADL. He requires support to remain sitting EOB and is mod to max A +2 assist for sit<>stands. Pt will require skilled OT in the acute setting as well as afterwards at the CIR level. He has excellent 24 hour support for afterwards, and he has demonstrated the need for all 3 disciplines (OT/PT/SLP).    Follow Up Recommendations  CIR;Supervision/Assistance - 24 hour    Equipment Recommendations  Tub/shower bench    Recommendations for Other Services Rehab consult     Precautions / Restrictions Precautions Precautions: Fall Restrictions Weight Bearing Restrictions: No      Mobility Bed Mobility Overal bed mobility: Needs Assistance Bed Mobility: Supine to Sit;Sit to Supine     Supine to sit: Mod assist;Max assist Sit to supine: Mod assist   General bed mobility comments: Pt able to initiate supine to sit with core activation but quickly reaches out for assist to come to fully upright, requires increased time and effort and maximal cuing to balance EoB and is unable  to maintain without pulling on RW in front of him   Transfers Overall transfer level: Needs assistance Equipment used: Rolling walker (2 wheeled) Transfers: Sit to/from Stand Sit to Stand: Max assist;From elevated surface;Mod assist;+2 physical assistance         General transfer comment: maxA with assist of pad under bottom, to power up from elevated surface, with standing pt unable to balance without posterior support on LE, with rest after standing pt able to sit>stand 3 more times with modA and use of momentum     Balance Overall balance assessment: Needs assistance Sitting-balance support: Feet supported;Bilateral upper extremity supported Sitting balance-Leahy Scale: Poor Sitting balance - Comments: requires external support by pulling back against RW Postural control: Posterior lean Standing balance support: During functional activity;Bilateral upper extremity supported Standing balance-Leahy Scale: Zero Standing balance comment: requires outside assist to maintain standing balance                           ADL either performed or assessed with clinical judgement   ADL Overall ADL's : Needs assistance/impaired Eating/Feeding: Maximal assistance   Grooming: Moderate assistance;Sitting Grooming Details (indicate cue type and reason): requires supported sitting, or BUE support for grooming tasks Upper Body Bathing: Moderate assistance   Lower Body Bathing: Maximal assistance   Upper Body Dressing : Moderate assistance   Lower Body Dressing: Maximal assistance Lower Body Dressing Details (indicate cue type and reason): Pt unable to take off socks at this time Toilet Transfer: Maximal assistance;+2 for physical assistance;+2 for safety/equipment;BSC;RW Toilet Transfer Details (indicate  cue type and reason): when Pt does not have support behind his legs he has zero balance Toileting- Clothing Manipulation and Hygiene: Maximal assistance   Tub/ Shower Transfer:  Maximal assistance;+2 for physical assistance;+2 for safety/equipment   Functional mobility during ADLs: Maximal assistance;+2 for physical assistance;+2 for safety/equipment;Rolling walker;Cueing for safety;Cueing for sequencing General ADL Comments: Pt demonstrates deficits in cognition, balance, also demonstrates generalized weakness and deconditioning impacting ability to perform ADL     Vision         Perception     Praxis      Pertinent Vitals/Pain Pain Assessment: No/denies pain Faces Pain Scale: No hurt     Hand Dominance     Extremity/Trunk Assessment Upper Extremity Assessment Upper Extremity Assessment: Generalized weakness   Lower Extremity Assessment Lower Extremity Assessment: Generalized weakness   Cervical / Trunk Assessment Cervical / Trunk Assessment: Kyphotic   Communication Communication Communication: Other (comment)(slow processing)   Cognition Arousal/Alertness: Awake/alert Behavior During Therapy: Flat affect Overall Cognitive Status: Impaired/Different from baseline Area of Impairment: Memory;Following commands;Safety/judgement;Awareness;Problem solving                     Memory: Decreased short-term memory Following Commands: Follows multi-step commands inconsistently;Follows multi-step commands with increased time Safety/Judgement: Decreased awareness of deficits Awareness: Intellectual Problem Solving: Slow processing;Decreased initiation;Difficulty sequencing;Requires verbal cues;Requires tactile cues General Comments: Pt seems to have improved cognition from previous sessions with SLP and other medical staff, but remains confused, able to follow simple commands with increased time.    General Comments  Pt with increased DOE with activity, SaO2 on RA 94%O2     Exercises     Shoulder Instructions      Home Living Family/patient expects to be discharged to:: Private residence Living Arrangements: Spouse/significant  other Available Help at Discharge: Family Type of Home: House Home Access: Level entry     Home Layout: Multi-level;Able to live on main level with bedroom/bathroom Alternate Level Stairs-Number of Steps: flight   Bathroom Shower/Tub: Teacher, early years/pre: Standard     Home Equipment: Cane - single point;Bedside commode   Additional Comments: Daughter lives about 25 min away, has 2 grown sons who can help as well. Pt and his wife live in an old farmhouse that is heated by a wood stove.      Prior Functioning/Environment Level of Independence: Needs assistance  Gait / Transfers Assistance Needed: ambulated with RW ADL's / Homemaking Assistance Needed: assist from wife for bathing and dressing    Comments: Daughter's biggest concern is getting Dad to eat so that he can get better -improve energy and strength        OT Problem List: Decreased strength;Decreased range of motion;Decreased activity tolerance;Impaired balance (sitting and/or standing);Decreased safety awareness;Decreased knowledge of use of DME or AE      OT Treatment/Interventions: Self-care/ADL training;Therapeutic exercise;DME and/or AE instruction;Therapeutic activities;Patient/family education;Balance training    OT Goals(Current goals can be found in the care plan section) Acute Rehab OT Goals Patient Stated Goal: go home tomorrow OT Goal Formulation: With patient Time For Goal Achievement: 04/22/19 Potential to Achieve Goals: Good ADL Goals Pt Will Perform Grooming: with set-up;sitting Pt Will Perform Upper Body Dressing: with supervision;sitting Pt Will Perform Lower Body Dressing: with min guard assist;sit to/from stand Pt Will Transfer to Toilet: with min guard assist;stand pivot transfer;bedside commode Pt Will Perform Toileting - Clothing Manipulation and hygiene: with min guard assist;sit to/from stand Additional ADL Goal #1: Pt will perform  bed mobility at supervision level prior to  engaging in ADL  OT Frequency: Min 3X/week   Barriers to D/C:    Pt has 24 hour assist from wife, daughter, and grown grandsons       Co-evaluation PT/OT/SLP Co-Evaluation/Treatment: Yes Reason for Co-Treatment: Necessary to address cognition/behavior during functional activity;For patient/therapist safety;To address functional/ADL transfers PT goals addressed during session: Mobility/safety with mobility;Balance;Proper use of DME OT goals addressed during session: ADL's and self-care;Proper use of Adaptive equipment and DME      AM-PAC OT "6 Clicks" Daily Activity     Outcome Measure Help from another person eating meals?: A Lot Help from another person taking care of personal grooming?: A Lot Help from another person toileting, which includes using toliet, bedpan, or urinal?: A Lot Help from another person bathing (including washing, rinsing, drying)?: A Lot Help from another person to put on and taking off regular upper body clothing?: A Lot Help from another person to put on and taking off regular lower body clothing?: A Lot 6 Click Score: 12   End of Session Equipment Utilized During Treatment: Gait belt;Rolling walker Nurse Communication: Mobility status;Precautions  Activity Tolerance: Patient tolerated treatment well Patient left: in bed;with call bell/phone within reach;with bed alarm set;with SCD's reapplied  OT Visit Diagnosis: Unsteadiness on feet (R26.81);Other abnormalities of gait and mobility (R26.89);Muscle weakness (generalized) (M62.81);Other symptoms and signs involving cognitive function;Adult, failure to thrive (R62.7)                Time: 6438-3818 OT Time Calculation (min): 32 min Charges:  OT General Charges $OT Visit: 1 Visit OT Evaluation $OT Eval Moderate Complexity: [place] OTR/L Acute Rehabilitation Services Pager: [phone] Office: [facility] 04/08/2019, 3:36 PM

## 2019-04-08 NOTE — Progress Notes (Signed)
PROGRESS NOTE  Joshua Whitaker MBW:[id number] DOB: 1941/12/16 DOA: 03/30/2019 PCP: [name], MD  Brief Narrative: 34YoM with extensive history of COPD, CAD, EtOH/tobacco abuse, HTN, HLD, chronic hyponatremia presented from [facility], he was was admitted 4/6 w 2 day history of cough, shortness of breath with recent hospitalization 3/20-3/23 for acute COPD exacerbation and severe hyponatremia/beer potomania of 109. On 4/7 he desaturated to 70s, needing BiPAP, was taken off BiPAP on 4 x 8 but had respiratory distress subsequently intubated and transferred to [facility].  He was found to have new onset a flutter with RVR elevated troponin, seen by cardiology.  Patient was was extubated 4/11 and was transferred to [facility] service 4/13.  He had COVID-19 test done that was negative.    HPI/Recap of past 24 hours:  He denies pain, he is really weak, does not eat much He wants to go home 7bm documented last 24hrs  Assessment/Plan: Principal Problem:   Acute on chronic respiratory failure with hypoxia (HCC) Active Problems:   Chronic obstructive pulmonary disease (HCC)   Atrial flutter (HCC)   Lobar pneumonia (HCC)   Acute on chronic respiratory failure (HCC)   Pericardial effusion   Palliative care by specialist   DNR (do not resuscitate) discussion   Adult failure to thrive   Acute on chronic respiratory failure with hypoxia secondary to COPD exacerbation, HCAP with component of aspiration pneumonia/small right pleural effusion -he was extubated and   Transferred from icu to hospitalist team on 4/13 -he was on vanc/cefepime initially, now on unasyn, last dose of abx on 4/16 -ng removed, he is started on dysphagia diet one, nectar thick liquid per speech recommendation  Diarrhea, no fever, no leukocytosis, no ab pain, from ensure? From abx? Hold ensure, he is refusing it anyway Start probiotics  Hypokalemia K2.6, replace, repeat lab in am, check mag  Hypophosphatemia: Replace phos  Acute  exacerbation of  COPD, home o2 4liters at baseline: Continue steroids,supplemental o2 and bronchodilators as above.  New onset Atrial flutter w RVR:in NSR now.Continue Cardizem po.Seen by cardiology and signed off- patient on therapeutic lovenox-they advised apixaban when able to able take p.o. for 6  Weeks. Stop aspirin when apixaban started and cont continue Plavix. Follow-up with cardiology for 14-day event monitoring as outpatient.May not be an ideal candidate for long-term anticoagulation due to his alcohol use.  CAD:  -He has ST elevation during hospitalization from 3/20-3/23 -Cardiology was consulted during admission as his initial EKG showed ST elevation along the inferior leads but this was reviewed with the Interventional Team (Dr. Ellyn Hack) and an urgent cath was not pursued as he was close to needing intubation at the time of arrival and the patient declined and made himself DNR/DNI (changed later during admission as he was in agreement for CPR but declined intubation). Troponin values remained flat that admission, peaking at 0.06. He was initially treated with Heparin but this was transitioned to Plavix 49m daily given the concern for underlying 3-vessel CAD.   Pericardial Effusion - small to moderate by recent echocardiogram on March 21 and without evidence of tamponade at that time.  This will ultimately need to be re-imaged to ensure stability/improvement. -f/u with cardiology  Transaminitis: tbili/alk phos unremarkable ast/alt 109/318.Unclear etiology, acute viral fibroids panel was negative, ultrasound abdomen s/p cholecystectomy No acute findings, ck unremarkable Continue hold statin, monitor lft  Alcohol use history, on thiamine  Smoking history  H/o prostate cancer, with h/o prostate surgery, unknown detail  FTT/moderate protein calorie malnutrition  with BMI 18.4  Two hospitalizations in less than one months PT/Ot recommend CIR, CIR consulted   Code Status: full   Family Communication: patient , daughter over the phone  Disposition Plan: inpatient rehab vs SNF vs home with home health   Consultants:   cardiology  Palliative care  CIR  Procedures:  Intubation/extubation  Ng tube placement and removal  MBS  Antibiotics:  As above   Objective: BP (!) 142/75 (BP Location: Left Arm)   Pulse 76   Temp 98.8 F (37.1 C) (Oral)   Resp 18   Ht _0  (1.778 m)   Wt 58.1 kg   SpO2 95%   BMI 18.38 kg/m   Intake/Output Summary (Last 24 hours) at 04/08/2019 1449 Last data filed at 04/08/2019 1024 Gross per 24 hour  Intake 977.57 ml  Output 1450 ml  Net -472.43 ml   Filed Weights   04/06/19 0533 04/07/19 0400 04/08/19 0621  Weight: 58.6 kg 58.2 kg 58.1 kg    Exam: Patient is examined daily including today on 04/08/2019, exams remain the same as of yesterday except that has changed    General:  Weak and frail  Cardiovascular: RRR  Respiratory: diminished at basis  Abdomen: Soft/ND/NT, positive BS  Musculoskeletal: No Edema  Neuro: alert, oriented   Data Reviewed: Basic Metabolic Panel: Recent Labs  Lab 04/02/19 0353  04/04/19 0838  04/05/19 0525 04/05/19 1757 04/06/19 0214 04/06/19 1605 04/07/19 0638 04/08/19 0600  NA 139  --  145  --   --   --  135  --  135 136  K 4.2  --  3.3*  --   --   --  2.9*  --  2.9* 2.6*  CL 107  --  108  --   --   --  95*  --  97* 99  CO2 23  --  25  --   --   --  28  --  29 26  GLUCOSE 132*  --  136*  --   --   --  104*  --  83 84  BUN 41*  --  33*  --   --   --  15  --  12 11  CREATININE 0.96  --  0.72  --   --   --  0.55*  --  0.63 0.54*  CALCIUM 8.3*  --  8.6*  --   --   --  7.9*  --  7.9* 7.9*  MG 2.5*   < > 2.2   < > 1.8 1.6* 1.6* 2.3 1.9  --   PHOS 3.1   < > 1.7*   < > 1.7* 3.8 2.5 1.9* 1.9* 2.0*   < > = values in this interval not displayed.   Liver Function Tests: Recent Labs  Lab 04/04/19 0838 04/06/19 0214 04/07/19 0638 04/08/19 0600  AST 62* 132* 109* 73*   ALT 123* 316* 318* 274*  ALKPHOS 69 67 60 63  BILITOT 0.5 0.6 0.8 0.7  PROT 5.3* 5.2* 5.1* 5.1*  ALBUMIN 2.4* 2.3* 2.4* 2.4*   No results for input(s): LIPASE, AMYLASE in the last 168 hours. No results for input(s): AMMONIA in the last 168 hours. CBC: Recent Labs  Lab 04/02/19 0353 04/04/19 0608 04/06/19 0214 04/07/19 0638 04/08/19 0600  WBC 12.3* 15.0* 12.6* 12.2* 10.3  HGB 10.2* 12.0* 12.9* 13.6 13.3  HCT 32.4* 37.4* 38.1* 40.1 38.4*  MCV 92.6 93.7 88.2 87.9 86.1  PLT 277 276  256 257 239   Cardiac Enzymes:   No results for input(s): CKTOTAL, CKMB, CKMBINDEX, TROPONINI in the last 168 hours. BNP (last 3 results) Recent Labs    03/13/19 2304  BNP 383.2*    ProBNP (last 3 results) No results for input(s): PROBNP in the last 8760 hours.  CBG: Recent Labs  Lab 04/07/19 0820 04/07/19 1248 04/07/19 1650 04/07/19 2020 04/08/19 1217  GLUCAP 84 149* 126* 95 139*    Recent Results (from the past 240 hour(s))  MRSA PCR Screening     Status: None   Collection Time: 03/30/19  9:38 PM  Result Value Ref Range Status   MRSA by PCR NEGATIVE NEGATIVE Final    Comment:        The GeneXpert MRSA Assay (FDA approved for NASAL specimens only), is one component of a comprehensive MRSA colonization surveillance program. It is not intended to diagnose MRSA infection nor to guide or monitor treatment for MRSA infections. Performed at [facility], [address], [place], [place] [address]   Respiratory Panel by PCR     Status: None   Collection Time: 04/01/19 12:55 PM  Result Value Ref Range Status   Adenovirus NOT DETECTED NOT DETECTED Final   Coronavirus 229E NOT DETECTED NOT DETECTED Final    Comment: (NOTE) The Coronavirus on the Respiratory Panel, DOES NOT test for the novel  Coronavirus (2019 nCoV)    Coronavirus HKU1 NOT DETECTED NOT DETECTED Final   Coronavirus NL63 NOT DETECTED NOT DETECTED Final   Coronavirus OC43 NOT DETECTED NOT DETECTED Final    Metapneumovirus NOT DETECTED NOT DETECTED Final   Rhinovirus / Enterovirus NOT DETECTED NOT DETECTED Final   Influenza A NOT DETECTED NOT DETECTED Final   Influenza B NOT DETECTED NOT DETECTED Final   Parainfluenza Virus 1 NOT DETECTED NOT DETECTED Final   Parainfluenza Virus 2 NOT DETECTED NOT DETECTED Final   Parainfluenza Virus 3 NOT DETECTED NOT DETECTED Final   Parainfluenza Virus 4 NOT DETECTED NOT DETECTED Final   Respiratory Syncytial Virus NOT DETECTED NOT DETECTED Final   Bordetella pertussis NOT DETECTED NOT DETECTED Final   Chlamydophila pneumoniae NOT DETECTED NOT DETECTED Final   Mycoplasma pneumoniae NOT DETECTED NOT DETECTED Final    Comment: Performed at [facility], [place] [address] [place], [place] [address]  Novel Coronavirus, NAA (hospital order; send-out to ref lab)     Status: None   Collection Time: 04/01/19 12:56 PM  Result Value Ref Range Status   SARS-CoV-2, NAA NOT DETECTED NOT DETECTED Final    Comment: Negative (Not Detected) results do not exclude infection caused by SARS CoV 2 and should not be used as the sole basis for treatment or other patient management decisions. Optimum specimen types and timing for peak viral levels during infections caused  by SARS CoV 2 have not been determined. Collection of multiple specimens (types and time points) from the same patient may be necessary to detect the virus. Improper specimen collection and handling, sequence variability underlying assay primers and or probes, or the presence of organisms in  quantities less than the limit of detection of the assay may lead to false negative results. Positive and negative predictive values of testing are highly dependent on prevalence. False negative results are more likely when prevalence of disease is high. (NOTE) The expected result is Negative (Not Detected). The SARS CoV 2 test is intended for the presumptive qualitative  detection of nucleic acid from SARS CoV 2 in  upper and lower  respir atory specimens. Testing methodology is real time RT PCR. Test results must be correlated with clinical presentation and  evaluated in the context of other laboratory and epidemiologic data.  Test performance can be affected because the epidemiology and  clinical spectrum of infection caused by SARS CoV 2 is not fully  known. For example, the optimum types of specimens to collect and  when during the course of infection these specimens are most likely  to contain detectable viral RNA may not be known. This test has not been Food and Drug Administration (FDA) cleared or  approved and has been authorized by FDA under an Emergency Use  Authorization (EUA). The test is only authorized for the duration of  the declaration that circumstances exist justifying the authorization  of emergency use of in vitro diagnostic tests for detection and or  diagnosis of SARS CoV 2 under Section 564(b)(1) of the Act, 21 U.S.C.  section [phone] 3(b)(1), unless the authorization is terminated or   revoked sooner. [facility] is certified under the  Clinical Laboratory Improvement Amendments of 1988 (CLIA), 42 U.S.C.  section [phone], to perform high complexity tests. Performed at [id number] [address], [place], [place] [address] Laboratory Director: Loleta Books, MD Fact Sheet for Healthcare Providers  BankingDealers.co.za Fact Sheet for Patients  StrictlyIdeas.no Performed at [facility], [place] [address], [place], [place] [address]    Coronavirus Source NASOPHARYNGEAL  Final    Comment: Performed at [facility], [address] [place], [place] [address]  Culture, respiratory (tracheal aspirate)     Status: None   Collection Time: 04/01/19  3:36 PM  Result Value Ref Range Status   Specimen Description TRACHEAL ASPIRATE  Final   Special Requests NONE   Final   Gram Stain   Final    ABUNDANT WBC PRESENT, PREDOMINANTLY PMN RARE GRAM POSITIVE COCCI RARE GRAM POSITIVE RODS    Culture   Final    RARE Consistent with normal respiratory flora. Performed at [facility], [place] [address] [place], [place] [address]    Report Status 04/04/2019 FINAL  Final     Studies: No results found.  Scheduled Meds: . chlorhexidine  15 mL Mouth Rinse BID  . Chlorhexidine Gluconate Cloth  6 each Topical Q2200  . clopidogrel  75 mg Per Tube Daily  . enoxaparin (LOVENOX) injection  85 mg Subcutaneous Q24H  . fluticasone furoate-vilanterol  1 puff Inhalation Daily  . folic acid  1 mg Per Tube Daily  . mouth rinse  15 mL Mouth Rinse q12n4p  . metoprolol tartrate  5 mg Intravenous Q6H  . multivitamin with minerals  1 tablet Oral Daily  . pantoprazole  40 mg Oral QHS  . thiamine  100 mg Per Tube Daily  . umeclidinium bromide  1 puff Inhalation Daily    Continuous Infusions: . sodium chloride 10 mL/hr at 04/06/19 1203  . ampicillin-sulbactam (UNASYN) IV 3 g (04/08/19 1108)  . potassium PHOSPHATE IVPB (mEq) 40 mEq (04/08/19 1118)     Time spent: 6mns I have personally reviewed and interpreted on  04/08/2019 daily labs, tele strips, imagings as discussed above under date review session and assessment and plans.  I reviewed all nursing notes, pharmacy notes, consultant notes,  vitals, pertinent old records  I have discussed plan of care as described above with RN , patient and family on 04/08/2019   FAnnamaria Boots  Erlinda Hong MD, PhD  [facility] Pager [phone]. If 7PM-7AM, please contact night-coverage at [email or web address], password [facility] 04/08/2019, 2:49 PM  LOS: 9 days

## 2019-04-09 ENCOUNTER — Inpatient Hospital Stay (HOSPITAL_COMMUNITY): Payer: Medicare Other

## 2019-04-09 DIAGNOSIS — E871 Hypo-osmolality and hyponatremia: Secondary | ICD-10-CM

## 2019-04-09 DIAGNOSIS — E876 Hypokalemia: Secondary | ICD-10-CM

## 2019-04-09 LAB — COMPREHENSIVE METABOLIC PANEL
ALT: 247 U/L — ABNORMAL HIGH (ref 0–44)
AST: 61 U/L — ABNORMAL HIGH (ref 15–41)
Albumin: 2.5 g/dL — ABNORMAL LOW (ref 3.5–5.0)
Alkaline Phosphatase: 63 U/L (ref 38–126)
Anion gap: 11 (ref 5–15)
BUN: 11 mg/dL (ref 8–23)
CO2: 23 mmol/L (ref 22–32)
Calcium: 7.9 mg/dL — ABNORMAL LOW (ref 8.9–10.3)
Chloride: 102 mmol/L (ref 98–111)
Creatinine, Ser: 0.58 mg/dL — ABNORMAL LOW (ref 0.61–1.24)
GFR calc Af Amer: >60 mL/min (ref >60)
GFR calc non Af Amer: >60 mL/min (ref >60)
Glucose, Bld: 82 mg/dL (ref 70–99)
Potassium: 3.3 mmol/L — ABNORMAL LOW (ref 3.5–5.1)
Sodium: 136 mmol/L (ref 135–145)
Total Bilirubin: 0.8 mg/dL (ref 0.3–1.2)
Total Protein: 5.4 g/dL — ABNORMAL LOW (ref 6.5–8.1)

## 2019-04-09 LAB — GLUCOSE, CAPILLARY
Glucose-Capillary: 70 mg/dL (ref 70–99)
Glucose-Capillary: 76 mg/dL (ref 70–99)
Glucose-Capillary: 77 mg/dL (ref 70–99)
Glucose-Capillary: 82 mg/dL (ref 70–99)
Glucose-Capillary: 98 mg/dL (ref 70–99)

## 2019-04-09 LAB — CBC
HCT: 40.2 % (ref 39.0–52.0)
Hemoglobin: 13.4 g/dL (ref 13.0–17.0)
MCH: 29.1 pg (ref 26.0–34.0)
MCHC: 33.3 g/dL (ref 30.0–36.0)
MCV: 87.2 fL (ref 80.0–100.0)
Platelets: 254 10*3/uL (ref 150–400)
RBC: 4.61 MIL/uL (ref 4.22–5.81)
RDW: 13.3 % (ref 11.5–15.5)
WBC: 11.6 10*3/uL — ABNORMAL HIGH (ref 4.0–10.5)
nRBC: 0 % (ref 0.0–0.2)

## 2019-04-09 LAB — MAGNESIUM: Magnesium: 1.6 mg/dL — ABNORMAL LOW (ref 1.7–2.4)

## 2019-04-09 LAB — PHOSPHORUS: Phosphorus: 2.2 mg/dL — ABNORMAL LOW (ref 2.5–4.6)

## 2019-04-09 MED ORDER — POTASSIUM CHLORIDE 10 MEQ/100ML IV SOLN: 10.0000 meq | INTRAVENOUS | Status: DC

## 2019-04-09 MED ORDER — MAGNESIUM SULFATE 2 GM/50ML IV SOLN
2.0000 g | Freq: Once | INTRAVENOUS | Status: AC
  Administered 2019-04-09: 2 g via INTRAVENOUS
  Filled 2019-04-09: qty 50

## 2019-04-09 MED ORDER — NYSTATIN 100000 UNIT/ML MT SUSP
5.0000 mL | Freq: Four times a day (QID) | OROMUCOSAL | Status: DC
  Administered 2019-04-09 – 2019-04-10 (×6): 500000 [IU] via ORAL
  Filled 2019-04-09 (×6): qty 5

## 2019-04-09 MED ORDER — POTASSIUM CHLORIDE 10 MEQ/100ML IV SOLN
10.0000 meq | INTRAVENOUS | Status: AC
  Administered 2019-04-09 (×6): 10 meq via INTRAVENOUS
  Filled 2019-04-09 (×4): qty 100

## 2019-04-09 NOTE — Progress Notes (Signed)
Inpatient Rehab Admissions:  Inpatient Rehab Consult received.  I met with patient at the bedside for rehabilitation assessment and to discuss goals and expectations of an inpatient rehab admission.  He demonstrates some confusion, but agreeable to consider inpatient rehab after he participates in therapy tomorrow.  I also spoke with his daughter, [name], and she is hopeful for rehab admission.  I will need insurance authorization before I can admit, possibly tomorrow, pending availability.    Signed: Shann Medal, PT, DPT Admissions Coordinator [phone] 04/09/19  4:07 PM

## 2019-04-09 NOTE — Progress Notes (Signed)
PROGRESS NOTE  Joshua Whitaker PYP:[id number] DOB: Oct 06, 1941 DOA: 03/30/2019 PCP: [name], MD  Brief Narrative: 66YoM with extensive history of COPD, CAD, EtOH/tobacco abuse, HTN, HLD, chronic hyponatremia presented from [facility], he was was admitted 4/6 w 2 day history of cough, shortness of breath with recent hospitalization 3/20-3/23 for acute COPD exacerbation and severe hyponatremia/beer potomania of 109. On 4/7 he desaturated to 70s, needing BiPAP, was taken off BiPAP on 4 x 8 but had respiratory distress subsequently intubated and transferred to [facility].  He was found to have new onset a flutter with RVR elevated troponin, seen by cardiology.  Patient was was extubated 4/11 and was transferred to [facility] service 4/13.  He had COVID-19 test done that was negative.    HPI/Recap of past 24 hours:  He is sitting up in chair, denies pain, no sob, no fever, reports feeling better He denies pain, he is really weak, does not eat much Diarrhea has improved after ensure stopped  Assessment/Plan: Principal Problem:   Acute on chronic respiratory failure with hypoxia (HCC) Active Problems:   Chronic obstructive pulmonary disease (HCC)   Transaminitis   Atrial flutter (HCC)   Lobar pneumonia (HCC)   Acute on chronic respiratory failure (HCC)   Pericardial effusion   Palliative care by specialist   DNR (do not resuscitate) discussion   Adult failure to thrive   Weakness generalized   Acute on chronic respiratory failure with hypoxia secondary to COPD exacerbation, HCAP with component of aspiration pneumonia/small right pleural effusion -he was extubated and   Transferred from icu to hospitalist team on 4/13 -he was on vanc/cefepime initially, now on unasyn, last dose of abx on 4/16 -ng removed, he is started on dysphagia diet one, nectar thick liquid per speech recommendation  Diarrhea, no fever, no leukocytosis, no ab pain, from ensure? From abx? Hold ensure, he is refusing it anyway  Start probiotics, seems has resolved  Hypokalemia/hypomagnesemia Remain low , continue to replace, recheck in am  Hypophosphatemia: Replace phos  Acute exacerbation of  COPD, home o2 4liters at baseline: he is off steroids,supplemental o2 and bronchodilators as above. No wheezing  New onset Atrial flutter w RVR:in NSR now.Continue Cardizem po.Seen by cardiology and signed off- patient on therapeutic lovenox-they advised apixaban when able to able take p.o. for 6  Weeks. Stop aspirin when apixaban started and cont continue Plavix. Follow-up with cardiology for 14-day event monitoring as outpatient.May not be an ideal candidate for long-term anticoagulation due to his alcohol use.  CAD:  -He has ST elevation during hospitalization from 3/20-3/23 -Cardiology was consulted during admission as his initial EKG showed ST elevation along the inferior leads but this was reviewed with the Interventional Team (Dr. Ellyn Hack) and an urgent cath was not pursued as he was close to needing intubation at the time of arrival and the patient declined and made himself DNR/DNI (changed later during admission as he was in agreement for CPR but declined intubation). Troponin values remained flat that admission, peaking at 0.06. He was initially treated with Heparin but this was transitioned to Plavix 85m daily given the concern for underlying 3-vessel CAD.   Pericardial Effusion - small to moderate by recent echocardiogram on March 21 and without evidence of tamponade at that time.  This will ultimately need to be re-imaged to ensure stability/improvement. -f/u with cardiology  Transaminitis: tbili/alk phos unremarkable ast/alt 109/318.Unclear etiology, acute viral fibroids panel was negative, ultrasound abdomen s/p cholecystectomy No acute findings, ck unremarkable Continue hold  statin, monitor lft  Oral thrush Patient has thick tongue coating, denies odynophagia will start nystatin solution  Alcohol use  history, on thiamine, reports drink 1-2 beer daily  Smoking history  H/o prostate cancer, with h/o prostate surgery, unknown detail  FTT/moderate protein calorie malnutrition with BMI 18.4  Two hospitalizations in less than one months PT/Ot recommend CIR, CIR consulted   Code Status: full  Family Communication: patient , daughter over the phone  Disposition Plan: inpatient rehab tomorrow pending insurance authorization   Consultants:   cardiology  Palliative care  CIR  Procedures:  Intubation/extubation  Ng tube placement and removal  MBS  Antibiotics:  As above   Objective: BP 140/66 (BP Location: Left Arm)   Pulse 76   Temp 98 F (36.7 C) (Oral)   Resp (!) 22   Ht _0  (1.778 m)   Wt 58.1 kg   SpO2 97%   BMI 18.38 kg/m   Intake/Output Summary (Last 24 hours) at 04/09/2019 1546 Last data filed at 04/09/2019 1530 Gross per 24 hour  Intake 748.75 ml  Output 1200 ml  Net -451.25 ml   Filed Weights   04/06/19 0533 04/07/19 0400 04/08/19 0621  Weight: 58.6 kg 58.2 kg 58.1 kg    Exam: Patient is examined daily including today on 04/09/2019, exams remain the same as of yesterday except that has changed    General:  Weak and frail  Cardiovascular: RRR  Respiratory: diminished at basis  Abdomen: Soft/ND/NT, positive BS  Musculoskeletal: No Edema  Neuro: alert, oriented   Data Reviewed: Basic Metabolic Panel: Recent Labs  Lab 04/04/19 0838  04/05/19 1757 04/06/19 0214 04/06/19 1605 04/07/19 0638 04/08/19 0600 04/09/19 0443  NA 145  --   --  135  --  135 136 136  K 3.3*  --   --  2.9*  --  2.9* 2.6* 3.3*  CL 108  --   --  95*  --  97* 99 102  CO2 25  --   --  28  --  _1 GLUCOSE 136*  --   --  104*  --  83 84 82  BUN 33*  --   --  15  --  _2 CREATININE 0.72  --   --  0.55*  --  0.63 0.54* 0.58*  CALCIUM 8.6*  --   --  7.9*  --  7.9* 7.9* 7.9*  MG 2.2   < > 1.6* 1.6* 2.3 1.9  --  1.6*  PHOS 1.7*   < > 3.8 2.5 1.9*  1.9* 2.0* 2.2*   < > = values in this interval not displayed.   Liver Function Tests: Recent Labs  Lab 04/04/19 8416 04/06/19 0214 04/07/19 0638 04/08/19 0600 04/09/19 0443  AST 62* 132* 109* 73* 61*  ALT 123* 316* 318* 274* 247*  ALKPHOS 69 67 60 63 63  BILITOT 0.5 0.6 0.8 0.7 0.8  PROT 5.3* 5.2* 5.1* 5.1* 5.4*  ALBUMIN 2.4* 2.3* 2.4* 2.4* 2.5*   No results for input(s): LIPASE, AMYLASE in the last 168 hours. No results for input(s): AMMONIA in the last 168 hours. CBC: Recent Labs  Lab 04/04/19 0608 04/06/19 0214 04/07/19 0638 04/08/19 0600 04/09/19 0443  WBC 15.0* 12.6* 12.2* 10.3 11.6*  HGB 12.0* 12.9* 13.6 13.3 13.4  HCT 37.4* 38.1* 40.1 38.4* 40.2  MCV 93.7 88.2 87.9 86.1 87.2  PLT 276 256 257 239 254   Cardiac Enzymes:   No  results for input(s): CKTOTAL, CKMB, CKMBINDEX, TROPONINI in the last 168 hours. BNP (last 3 results) Recent Labs    03/13/19 2304  BNP 383.2*    ProBNP (last 3 results) No results for input(s): PROBNP in the last 8760 hours.  CBG: Recent Labs  Lab 04/08/19 1217 04/08/19 1711 04/09/19 0021 04/09/19 0554 04/09/19 1153  GLUCAP 139* 112* 70 76 98    Recent Results (from the past 240 hour(s))  MRSA PCR Screening     Status: None   Collection Time: 03/30/19  9:38 PM  Result Value Ref Range Status   MRSA by PCR NEGATIVE NEGATIVE Final    Comment:        The GeneXpert MRSA Assay (FDA approved for NASAL specimens only), is one component of a comprehensive MRSA colonization surveillance program. It is not intended to diagnose MRSA infection nor to guide or monitor treatment for MRSA infections. Performed at [facility], [address] [place], [place] [address]   Respiratory Panel by PCR     Status: None   Collection Time: 04/01/19 12:55 PM  Result Value Ref Range Status   Adenovirus NOT DETECTED NOT DETECTED Final   Coronavirus 229E NOT DETECTED NOT DETECTED Final    Comment: (NOTE) The Coronavirus on the Respiratory  Panel, DOES NOT test for the novel  Coronavirus (2019 nCoV)    Coronavirus HKU1 NOT DETECTED NOT DETECTED Final   Coronavirus NL63 NOT DETECTED NOT DETECTED Final   Coronavirus OC43 NOT DETECTED NOT DETECTED Final   Metapneumovirus NOT DETECTED NOT DETECTED Final   Rhinovirus / Enterovirus NOT DETECTED NOT DETECTED Final   Influenza A NOT DETECTED NOT DETECTED Final   Influenza B NOT DETECTED NOT DETECTED Final   Parainfluenza Virus 1 NOT DETECTED NOT DETECTED Final   Parainfluenza Virus 2 NOT DETECTED NOT DETECTED Final   Parainfluenza Virus 3 NOT DETECTED NOT DETECTED Final   Parainfluenza Virus 4 NOT DETECTED NOT DETECTED Final   Respiratory Syncytial Virus NOT DETECTED NOT DETECTED Final   Bordetella pertussis NOT DETECTED NOT DETECTED Final   Chlamydophila pneumoniae NOT DETECTED NOT DETECTED Final   Mycoplasma pneumoniae NOT DETECTED NOT DETECTED Final    Comment: Performed at [facility], [place] [address], [place], [place] [address]  Novel Coronavirus, NAA (hospital order; send-out to ref lab)     Status: None   Collection Time: 04/01/19 12:56 PM  Result Value Ref Range Status   SARS-CoV-2, NAA NOT DETECTED NOT DETECTED Final    Comment: Negative (Not Detected) results do not exclude infection caused by SARS CoV 2 and should not be used as the sole basis for treatment or other patient management decisions. Optimum specimen types and timing for peak viral levels during infections caused  by SARS CoV 2 have not been determined. Collection of multiple specimens (types and time points) from the same patient may be necessary to detect the virus. Improper specimen collection and handling, sequence variability underlying assay primers and or probes, or the presence of organisms in  quantities less than the limit of detection of the assay may lead to false negative results. Positive and negative predictive values of testing are highly dependent on prevalence. False negative results are  more likely when prevalence of disease is high. (NOTE) The expected result is Negative (Not Detected). The SARS CoV 2 test is intended for the presumptive qualitative  detection of nucleic acid from SARS CoV 2 in upper and lower  respir atory specimens. Testing methodology is  real time RT PCR. Test results must be correlated with clinical presentation and  evaluated in the context of other laboratory and epidemiologic data.  Test performance can be affected because the epidemiology and  clinical spectrum of infection caused by SARS CoV 2 is not fully  known. For example, the optimum types of specimens to collect and  when during the course of infection these specimens are most likely  to contain detectable viral RNA may not be known. This test has not been Food and Drug Administration (FDA) cleared or  approved and has been authorized by FDA under an Emergency Use  Authorization (EUA). The test is only authorized for the duration of  the declaration that circumstances exist justifying the authorization  of emergency use of in vitro diagnostic tests for detection and or  diagnosis of SARS CoV 2 under Section 564(b)(1) of the Act, 21 U.S.C.  section [phone] 3(b)(1), unless the authorization is terminated or   revoked sooner. [facility] is certified under the  Clinical Laboratory Improvement Amendments of 1988 (CLIA), 42 U.S.C.  section [phone], to perform high complexity tests. Performed at [facility] [id number], [place], [place] [address] Laboratory Director: Loleta Books, MD Fact Sheet for Healthcare Providers  BankingDealers.co.za Fact Sheet for Patients  StrictlyIdeas.no Performed at [facility], [place] [address], [place], [place] [address]    Coronavirus Source NASOPHARYNGEAL  Final    Comment: Performed at [facility], [address], [place],  [place] [address]  Culture, respiratory (tracheal aspirate)     Status: None   Collection Time: 04/01/19  3:36 PM  Result Value Ref Range Status   Specimen Description TRACHEAL ASPIRATE  Final   Special Requests NONE  Final   Gram Stain   Final    ABUNDANT WBC PRESENT, PREDOMINANTLY PMN RARE GRAM POSITIVE COCCI RARE GRAM POSITIVE RODS    Culture   Final    RARE Consistent with normal respiratory flora. Performed at [facility], [place] [address] [place], [place] [address]    Report Status 04/04/2019 FINAL  Final     Studies: Dg Chest 2 View  Result Date: 04/09/2019 CLINICAL DATA:  Respiratory failure.  Possible pneumonia. EXAM: CHEST - 2 VIEW COMPARISON:  04/02/2019 and 01/06/2019 FINDINGS: Patient slightly rotated to the right. Lungs are adequately inflated and demonstrate evidence of emphysematous disease with left upper lobe scarring. No focal lobar consolidation. Small amount of bilateral pleural fluid seen posteriorly on the lateral film. No pneumothorax. Cardiomediastinal silhouette is within normal. Remainder of the exam is unchanged. IMPRESSION: Small bilateral pleural effusions. Emphysematous disease. Electronically Signed   By: Marin Olp M.D.   On: 04/09/2019 09:08    Scheduled Meds: . chlorhexidine  15 mL Mouth Rinse BID  . Chlorhexidine Gluconate Cloth  6 each Topical Q2200  . clopidogrel  75 mg Per Tube Daily  . enoxaparin (LOVENOX) injection  85 mg Subcutaneous Q24H  . fluticasone furoate-vilanterol  1 puff Inhalation Daily  . folic acid  1 mg Per Tube Daily  . mouth rinse  15 mL Mouth Rinse q12n4p  . metoprolol tartrate  5 mg Intravenous Q6H  . multivitamin with minerals  1 tablet Oral Daily  . pantoprazole  40 mg Oral QHS  . saccharomyces boulardii  250 mg Oral BID  . thiamine  100 mg Per Tube Daily  . umeclidinium bromide  1 puff Inhalation Daily    Continuous  Infusions:    Time spent: 66mns I have personally reviewed and interpreted on  04/09/2019 daily  labs, tele strips, imagings as discussed above under date review session and assessment and plans.  I reviewed all nursing notes, pharmacy notes, consultant notes,  vitals, pertinent old records  I have discussed plan of care as described above with RN , patient on 04/09/2019   FFlorencia ReasonsMD, PhD  [facility] Pager [phone] If 7PM-7AM, please contact night-coverage at [email or web address], password [facility]4/16/2020, 3:46 PM  LOS: 10 days

## 2019-04-09 NOTE — Care Management Important Message (Signed)
Important Message  Patient Details  Name: Joshua Whitaker MRN: [id number] Date of Birth: 1941/02/03   Medicare Important Message Given:  Yes    Joesphine Schemm 04/09/2019, 1:10 PM

## 2019-04-09 NOTE — Progress Notes (Signed)
  Speech Language Pathology Treatment: Dysphagia  Patient Details Name: Joshua Whitaker MRN: [id number] DOB: 09-10-1941 Today's Date: 04/09/2019 Time: 4098-1191 SLP Time Calculation (min) (ACUTE ONLY): 10 min  Assessment / Plan / Recommendation Clinical Impression  Pt consumed current diet textures with Mod verbal/tactile cues to slow his rate of intake and make his boluses smaller in size. No overt coughing was observed, but he did have a mild increase in RR after taking bites/sips, which would return to baseline fairly quickly. He does not appear to have as many audible secretions as he did during MBS, but he remains hypophonic. Would continue current diet for now.    HPI HPI: 78 y.o. male with prior history of COPD, CAD, ETOH/ tobacco abuse, HTN, HLD, chronic hyponatremia presented to [facility] on 4/6 with 2 day history of shortness of breath and cough. Recent hospitalization for acute exacerbation of COPD and severe hyponatremia (3/20-3/23/20). Desaturation requiring BiPAP and was ultimately intubated 4/8-4/11/20. CXR showed L greater than R patchy densities. COVID-19 pending.      SLP Plan  Continue with current plan of care       Recommendations  Diet recommendations: Dysphagia 1 (puree);Nectar-thick liquid Liquids provided via: Cup;Straw Medication Administration: Crushed with puree Supervision: Patient able to self feed;Full supervision/cueing for compensatory strategies Compensations: Slow rate;Small sips/bites Postural Changes and/or Swallow Maneuvers: Seated upright 90 degrees;Upright 30-60 min after meal                Oral Care Recommendations: Oral care BID Follow up Recommendations: Inpatient Rehab SLP Visit Diagnosis: Dysphagia, pharyngeal phase (R13.13) Plan: Continue with current plan of care       GO                Venita Sheffield Parthena Fergeson 04/09/2019, 11:24 AM  Pollyann Glen, M.A. [place] Acute Environmental education officer [phone] Office [phone]

## 2019-04-09 NOTE — Evaluation (Signed)
Speech Language Pathology Evaluation Patient Details Name: Joshua Whitaker MRN: [id number] DOB: 02/18/1941 Today's Date: 04/09/2019 Time: 2353-6144 SLP Time Calculation (min) (ACUTE ONLY): 10 min  Problem List:  Patient Active Problem List   Diagnosis Date Noted  . Weakness generalized   . Adult failure to thrive   . Pericardial effusion 04/02/2019  . Palliative care by specialist   . DNR (do not resuscitate) discussion   . Lobar pneumonia ([place]) 04/01/2019  . Acute on chronic respiratory failure ([place]) 04/01/2019  . Atrial flutter ([place]) 03/30/2019  . Acute on chronic respiratory failure with hypoxia ([place]) 03/16/2019  . Alcohol abuse 03/16/2019  . Tobacco abuse 03/16/2019  . Medically noncompliant 03/16/2019  . Pressure injury of skin 03/14/2019  . Coronary artery calcification 03/14/2019  . Aortic atherosclerosis ([place]) 03/14/2019  . Chronic obstructive pulmonary disease ([place])   . Hyponatremia   . ST elevation   . Transaminitis   . Nodule of right lung 10/12/2015  . H/O prostate cancer 04/21/2014  . Essential hypertension 03/20/2011  . Hyperlipidemia 03/20/2011  . Panlobular emphysema ([place]) 03/20/2011   Past Medical History:  Past Medical History:  Diagnosis Date  . Alcohol abuse   . Allergy   . Anxiety   . Asthma   . COPD (chronic obstructive pulmonary disease) ([place])   . Coronary artery calcification seen on CT scan   . Depression   . Hyponatremia   . Prostate cancer [facility])    Past Surgical History:  Past Surgical History:  Procedure Laterality Date  . EYE SURGERY    . PROSTATE SURGERY    . SPINE SURGERY     HPI:  78 y.o. male with prior history of COPD, CAD, ETOH/ tobacco abuse, HTN, HLD, chronic hyponatremia presented to [facility] on 4/6 with 2 day history of shortness of breath and cough. Recent hospitalization for acute exacerbation of COPD and severe hyponatremia (3/20-3/23/20). Desaturation requiring BiPAP and was ultimately intubated 4/8-4/11/20. CXR showed L  greater than R patchy densities. COVID-19 pending.   Assessment / Plan / Recommendation Clinical Impression  Pt has moderate cognitive impairments that include reduced selective attention, orientation to situation, and storage/recall of new information. Working memory is also impaired, and pt is impulsive particularly with PO intake, resulting in the need for repeated cueing for safety. Would benefit from additional clarification of baseline level of function. Will f/u to facilitate cognitive abilities and safety.    SLP Assessment  SLP Recommendation/Assessment: Patient needs continued Speech Lanaguage Pathology Services SLP Visit Diagnosis: Cognitive communication deficit (R41.841)    Follow Up Recommendations  Inpatient Rehab    Frequency and Duration min 2x/week  2 weeks      SLP Evaluation Cognition  Overall Cognitive Status: No family/caregiver present to determine baseline cognitive functioning Arousal/Alertness: Awake/alert Orientation Level: Oriented to person;Oriented to place;Oriented to time;Disoriented to situation Attention: Selective Selective Attention: Impaired Selective Attention Impairment: Verbal basic Memory: Impaired Memory Impairment: Storage deficit;Retrieval deficit;Decreased recall of new information Awareness: Impaired Awareness Impairment: Emergent impairment Problem Solving: Impaired Problem Solving Impairment: Verbal basic Behaviors: Impulsive Safety/Judgment: Impaired       Comprehension  Auditory Comprehension Overall Auditory Comprehension: Appears within functional limits for tasks assessed(basic, functional tasks)    Expression Expression Primary Mode of Expression: Verbal Verbal Expression Overall Verbal Expression: Appears within functional limits for tasks assessed   Oral / Motor  Motor Speech Overall Motor Speech: Other (comment)(hypophonic s/p intubation)   GO  Venita Sheffield Eain Mullendore 04/09/2019, 11:36 AM  Pollyann Glen,  M.A. [place] Acute Environmental education officer [phone] Office [phone]

## 2019-04-09 NOTE — Progress Notes (Signed)
Occupational Therapy Treatment Patient Details Name: Joshua Whitaker MRN: [id number] DOB: 1941/12/10 Today's Date: 04/09/2019    History of present illness 78 y.o. male, with history of COPD, alcohol abuse, tobacco abuse, hypertension, hyponatremia was brought to the ED for shortness of breath. Patient was recently admitted to the hospital for concern for MI at that time he did not have cardiac catheterization. In ED patient was found to be in new onset atrial flutter, chest x-ray showed possibility of pneumonia. Developed respiratory failure intubated 4/7-4/11   OT comments  Pt with improvement in general strength. Able to achieve sitting EOB and stand using stedy with min assist and second person for safety. Pt demonstrating poor activity tolerance, repeatedly stabilizing on forearms in sitting to rest. Pt aware he needed to have a BM, transferred to [facility] with stedy. Pt is cooperative, continues to demonstrate slow processing speed.   Follow Up Recommendations  CIR;Supervision/Assistance - 24 hour    Equipment Recommendations  Tub/shower bench    Recommendations for Other Services      Precautions / Restrictions Precautions Precautions: Fall Precaution Comments: on 02 at baseline Restrictions Weight Bearing Restrictions: No       Mobility Bed Mobility Overal bed mobility: Needs Assistance Bed Mobility: Supine to Sit     Supine to sit: Min assist     General bed mobility comments: minA to bring trunk to upright, pt able to move LE across bed to floor  Transfers Overall transfer level: Needs assistance   Transfers: Sit to/from Stand Sit to Stand: From elevated surface;+2 physical assistance;Min assist;Min guard         General transfer comment: minAx2 for initial power up to [facility] from bed, once on Stedy pt able to perform 3 more x sit>stands, before requesting to use BSC. After toileting pt requires minA for powerup from lower surface, min guard to lower to  recliner    Balance Overall balance assessment: Needs assistance Sitting-balance support: Feet supported;Bilateral upper extremity supported Sitting balance-Leahy Scale: Fair Sitting balance - Comments: min initially, improved to min guard, pt frequently leaning forward to tripod on forearms, unable to withstand challenge to sitting balance Postural control: Posterior lean Standing balance support: During functional activity;Bilateral upper extremity supported Standing balance-Leahy Scale: Poor Standing balance comment: B UE support of stedy and min assist                           ADL either performed or assessed with clinical judgement   ADL Overall ADL's : Needs assistance/impaired                         Toilet Transfer: Maximal assistance;+2 for safety/equipment;BSC Toilet Transfer Details (indicate cue type and reason): with stedy Toileting- Clothing Manipulation and Hygiene: Total assistance;Sit to/from stand Toileting - Clothing Manipulation Details (indicate cue type and reason): from stedy             Vision       Perception     Praxis      Cognition Arousal/Alertness: Awake/alert Behavior During Therapy: Flat affect Overall Cognitive Status: Impaired/Different from baseline Area of Impairment: Memory;Following commands;Safety/judgement;Awareness;Problem solving                     Memory: Decreased short-term memory Following Commands: Follows multi-step commands inconsistently;Follows one step commands with increased time Safety/Judgement: Decreased awareness of deficits Awareness: Intellectual Problem Solving: Slow processing;Decreased initiation;Difficulty sequencing;Requires  verbal cues;Requires tactile cues          Exercises     Shoulder Instructions       General Comments      Pertinent Vitals/ Pain       Pain Assessment: No/denies pain  Home Living       Type of Home: House                               Lives With: Spouse    Prior Functioning/Environment              Frequency  Min 3X/week        Progress Toward Goals  OT Goals(current goals can now be found in the care plan section)  Progress towards OT goals: Progressing toward goals  Acute Rehab OT Goals Patient Stated Goal: to go home OT Goal Formulation: With patient Time For Goal Achievement: 04/22/19 Potential to Achieve Goals: Good  Plan Discharge plan remains appropriate    Co-evaluation    PT/OT/SLP Co-Evaluation/Treatment: Yes Reason for Co-Treatment: For patient/therapist safety   OT goals addressed during session: ADL's and self-care      AM-PAC OT "6 Clicks" Daily Activity     Outcome Measure   Help from another person eating meals?: A Lot Help from another person taking care of personal grooming?: A Lot Help from another person toileting, which includes using toliet, bedpan, or urinal?: A Lot Help from another person bathing (including washing, rinsing, drying)?: A Lot Help from another person to put on and taking off regular upper body clothing?: A Lot Help from another person to put on and taking off regular lower body clothing?: A Lot 6 Click Score: 12    End of Session Equipment Utilized During Treatment: Gait belt;Oxygen(2L)  OT Visit Diagnosis: Unsteadiness on feet (R26.81);Other abnormalities of gait and mobility (R26.89);Muscle weakness (generalized) (M62.81);Other symptoms and signs involving cognitive function;Adult, failure to thrive (R62.7)   Activity Tolerance Patient tolerated treatment well   Patient Left in chair;with call bell/phone within reach;with chair alarm set   Nurse Communication Mobility status;Need for lift equipment(pt had BM)        Time: 1330-1404 OT Time Calculation (min): 34 min  Charges: OT General Charges $OT Visit: 1 Visit OT Treatments $Self Care/Home Management : 8-22 mins  Nestor Lewandowsky, OTR/L Acute Rehabilitation  Services Pager: [phone] Office: [phone]   Malka So 04/09/2019, 2:45 PM

## 2019-04-09 NOTE — Progress Notes (Signed)
ANTICOAGULATION CONSULT NOTE - Follow Up Consult  Pharmacy Consult for Lovenox Indication: New afib  Allergies  Allergen Reactions  . Tiotropium Bromide Monohydrate Other (See Comments)    Severe urinary retention  . Varenicline Other (See Comments)    Psychiatric and dreams  . Iodinated Diagnostic Agents   . Statins     Patient Measurements: Height: 5\' 10"  (177.8 cm) Weight: 128 lb 1.4 oz (58.1 kg) IBW/kg (Calculated) : 73   Vital Signs: Temp: 98.5 F (36.9 C) (04/16 0535) Temp Source: Oral (04/16 0535) BP: 163/74 (04/16 0535) Pulse Rate: 70 (04/16 0535)  Labs: Recent Labs    04/07/19 0638 04/08/19 0600 04/09/19 0443  HGB 13.6 13.3 13.4  HCT 40.1 38.4* 40.2  PLT 257 239 254  CREATININE 0.63 0.54* 0.58*    Estimated Creatinine Clearance: 65.6 mL/min (A) (by C-G formula based on SCr of 0.58 mg/dL (L)).  Assessment: Anticoag: New onset afib and started on apixaban 5mg  at [facility] (age <80, wt >60, Scr 0.96); CHADSVAS 3. Change to Lovenox until can take PO apixaban - Dr Tana Coast. CBC WNL and stable. Scr WNL. Cardiology advised apixaban when able to able take p.o. for 6  Weeks?  Goal of Therapy:  Anti-Xa level 0.6-1 units/ml 4hrs after LMWH dose given Monitor platelets by anticoagulation protocol: Yes   Plan:  Lovenox 85mg  SQ q24 (1.5mg /kg) Monitor CBC at least q 72h F/U plans to return to apixaban K runs x 6, Mag 2g IV x 1  Chelsye Suhre S. Alford Highland, PharmD, BCPS Clinical Staff Pharmacist Eilene Ghazi Stillinger 04/09/2019,10:11 AM

## 2019-04-09 NOTE — Progress Notes (Signed)
PT Progress Note  Clinical Impression: Pt is making good progress towards his goals today, however continues to be limited in safe mobility by increased DoE, and work of breathing (requiring frequent rest breaks to catch his breath) as well as decreased strength and endurance. Pt currently requires min A for bed mobility and minA for sit to stand from low surface using the Stedy and min guard for coming into standing from elevated Stedy pads x3. D/c plans remain appropriate at this time. PT will continue follow acutely and is hopeful for discharge to CIR tomorrow.      04/09/19 1400  PT Visit Information  Last PT Received On 04/09/19  Assistance Needed +2  PT/OT/SLP Co-Evaluation/Treatment Yes  Reason for Co-Treatment For patient/therapist safety  PT goals addressed during session Mobility/safety with mobility  History of Present Illness 78 y.o. male, with history of COPD, alcohol abuse, tobacco abuse, hypertension, hyponatremia was brought to the ED for shortness of breath. Patient was recently admitted to the hospital for concern for MI at that time he did not have cardiac catheterization. In ED patient was found to be in new onset atrial flutter, chest x-ray showed possibility of pneumonia. Developed respiratory failure intubated 4/7-4/11  Subjective Data  Patient Stated Goal go home tomorrow  Precautions  Precautions Fall  Precaution Comments on 2L O2 at baseline  Restrictions  Weight Bearing Restrictions No  Pain Assessment  Pain Assessment No/denies pain  Cognition  Arousal/Alertness Awake/alert  Behavior During Therapy Flat affect  Overall Cognitive Status Impaired/Different from baseline  Area of Impairment Memory;Following commands;Safety/judgement;Awareness;Problem solving  Memory Decreased short-term memory  Following Commands Follows multi-step commands inconsistently;Follows multi-step commands with increased time  Safety/Judgement Decreased awareness of deficits   Awareness Intellectual  Problem Solving Slow processing;Decreased initiation;Difficulty sequencing;Requires verbal cues;Requires tactile cues  Bed Mobility  Overal bed mobility Needs Assistance  Bed Mobility Supine to Sit;Sit to Supine  Supine to sit Min assist  General bed mobility comments minA to bring trunk to upright, pt able to move LE across bed to floor  Transfers  Overall transfer level Needs assistance  Transfer via [place] to/from Stand  Sit to Stand From elevated surface;+2 physical assistance;Min assist;Min guard  General transfer comment minAx2 for initial power up to [facility] from bed, once on Stedy pt able to perform 3 more x sit>stands, before requesting to use BSC. After toileting pt requires minA for powerup from lower surface, min guard to lower to recliner  Ambulation/Gait  Gait velocity slowed  Balance  Overall balance assessment Needs assistance  Sitting-balance support Feet supported;Bilateral upper extremity supported  Sitting balance-Leahy Scale Poor  Sitting balance - Comments requires external support by pulling back against RW  Postural control Posterior lean  Standing balance support During functional activity;Bilateral upper extremity supported  Standing balance-Leahy Scale Zero  Standing balance comment requires outside assist to maintain standing balance  General Comments  General comments (skin integrity, edema, etc.) Pt requires frequent restbreaks, tripoding over his knees to decrease accessory muscle work  PT - End of Session  Equipment Utilized During Treatment Gait belt  Activity Tolerance Patient limited by fatigue  Patient left in bed;with call bell/phone within reach;with bed alarm set  Nurse Communication Mobility status;Precautions   PT - Assessment/Plan  PT Plan Current plan remains appropriate  PT Visit Diagnosis Unsteadiness on feet (R26.81);Other abnormalities of gait and mobility (R26.89);Muscle weakness  (generalized) (M62.81);Difficulty in walking, not elsewhere classified (R26.2)  PT Frequency (ACUTE ONLY) Min  4X/week  Recommendations for Other Services Rehab consult  Follow Up Recommendations CIR  PT equipment Other (comment) (TBD at next venue)  AM-PAC PT "6 Clicks" Mobility Outcome Measure (Version 2)  Help needed turning from your back to your side while in a flat bed without using bedrails? 1  Help needed moving from lying on your back to sitting on the side of a flat bed without using bedrails? 1  Help needed moving to and from a bed to a chair (including a wheelchair)? 1  Help needed standing up from a chair using your arms (e.g., wheelchair or bedside chair)? 1  Help needed to walk in hospital room? 1  Help needed climbing 3-5 steps with a railing?  1  6 Click Score 6  Consider Recommendation of Discharge To: CIR/SNF/LTACH  Acute Rehab PT Goals  PT Goal Formulation With patient/family  Time For Goal Achievement 04/22/19  Potential to Achieve Goals Fair  PT Time Calculation  PT Start Time (ACUTE ONLY) 1330  PT Stop Time (ACUTE ONLY) 1404  PT Time Calculation (min) (ACUTE ONLY) 34 min  PT General Charges  $$ ACUTE PT VISIT 1 Visit  PT Treatments  $Therapeutic Activity 8-22 mins   Alejo Beamer B. Migdalia Dk PT, DPT Acute Rehabilitation Services Pager [phone] Office [phone]

## 2019-04-09 NOTE — Progress Notes (Signed)
Patient is currently resting in bed after being up in recliner chair x 2-3 hrs and tolerated well.  No c/o. No distress noted. Meds taken without difficulty and tolerated well.

## 2019-04-10 DIAGNOSIS — E43 Unspecified severe protein-calorie malnutrition: Secondary | ICD-10-CM

## 2019-04-10 DIAGNOSIS — J410 Simple chronic bronchitis: Secondary | ICD-10-CM

## 2019-04-10 DIAGNOSIS — G9341 Metabolic encephalopathy: Secondary | ICD-10-CM

## 2019-04-10 DIAGNOSIS — R5381 Other malaise: Principal | ICD-10-CM

## 2019-04-10 DIAGNOSIS — I483 Typical atrial flutter: Secondary | ICD-10-CM

## 2019-04-10 LAB — COMPREHENSIVE METABOLIC PANEL
ALT: 199 U/L — ABNORMAL HIGH (ref 0–44)
AST: 47 U/L — ABNORMAL HIGH (ref 15–41)
Albumin: 2.5 g/dL — ABNORMAL LOW (ref 3.5–5.0)
Alkaline Phosphatase: 60 U/L (ref 38–126)
Anion gap: 4 — ABNORMAL LOW (ref 5–15)
BUN: 14 mg/dL (ref 8–23)
CO2: 29 mmol/L (ref 22–32)
Calcium: 8.1 mg/dL — ABNORMAL LOW (ref 8.9–10.3)
Chloride: 103 mmol/L (ref 98–111)
Creatinine, Ser: 0.58 mg/dL — ABNORMAL LOW (ref 0.61–1.24)
GFR calc Af Amer: >60 mL/min (ref >60)
GFR calc non Af Amer: >60 mL/min (ref >60)
Glucose, Bld: 85 mg/dL (ref 70–99)
Potassium: 3.9 mmol/L (ref 3.5–5.1)
Sodium: 136 mmol/L (ref 135–145)
Total Bilirubin: 1 mg/dL (ref 0.3–1.2)
Total Protein: 5.1 g/dL — ABNORMAL LOW (ref 6.5–8.1)

## 2019-04-10 LAB — GLUCOSE, CAPILLARY
Glucose-Capillary: 75 mg/dL (ref 70–99)
Glucose-Capillary: 76 mg/dL (ref 70–99)
Glucose-Capillary: 79 mg/dL (ref 70–99)

## 2019-04-10 LAB — MAGNESIUM: Magnesium: 1.8 mg/dL (ref 1.7–2.4)

## 2019-04-10 MED ORDER — ADULT MULTIVITAMIN W/MINERALS CH: 1.0000 | ORAL_TABLET | Freq: Every day | ORAL | Status: AC

## 2019-04-10 MED ORDER — SACCHAROMYCES BOULARDII 250 MG PO CAPS: 250.0000 mg | ORAL_CAPSULE | Freq: Two times a day (BID) | ORAL | Status: DC

## 2019-04-10 MED ORDER — APIXABAN 5 MG PO TABS: 5.0000 mg | ORAL_TABLET | Freq: Two times a day (BID) | ORAL | Status: DC

## 2019-04-10 MED ORDER — PANTOPRAZOLE SODIUM 40 MG PO TBEC: 40.0000 mg | DELAYED_RELEASE_TABLET | Freq: Every day | ORAL | Status: DC

## 2019-04-10 MED ORDER — NYSTATIN 100000 UNIT/ML MT SUSP: 5.0000 mL | Freq: Four times a day (QID) | OROMUCOSAL | 0 refills | Status: DC

## 2019-04-10 MED ORDER — METOPROLOL TARTRATE 12.5 MG HALF TABLET
12.5000 mg | ORAL_TABLET | Freq: Two times a day (BID) | ORAL | Status: DC
  Administered 2019-04-10: 12.5 mg via ORAL
  Filled 2019-04-10: qty 1

## 2019-04-10 MED ORDER — METOPROLOL TARTRATE 25 MG PO TABS: 12.5000 mg | ORAL_TABLET | Freq: Two times a day (BID) | ORAL | Status: DC

## 2019-04-10 MED ORDER — RESOURCE THICKENUP CLEAR PO POWD: ORAL | Status: DC

## 2019-04-10 NOTE — Progress Notes (Signed)
Michel Santee, PT  Rehab Admission Coordinator  Physical Medicine and Rehabilitation  PMR Pre-admission  Signed  Date of Service:  04/10/2019 12:56 PM       Related encounter: ED to Hosp-Admission (Current) from 03/30/2019 in [facility]      Signed         Show:Clear all '[x]'$ Manual'[x]'$ Template'[x]'$ Copied  Added by: '[x]'$ Michel Santee, PT  '[]'$ Hover for details PMR Admission Coordinator Pre-Admission Assessment  Patient: Joshua Whitaker is an 78 y.o., male MRN: [id number] DOB: 10-31-1941 Height: '5\' 10"'$  (177.8 cm) Weight: 52.5 kg                                                                                                                                                  Insurance Information HMO: yes    PPO:      PCP:      IPA:      80/20:      OTHER:  PRIMARY: U[employer] Medicare      Policy#: [id number]      Subscriber: patient CM Name: Georgia Lopes   Phone#: [phone]     Fax#:  Pre-Cert#: [id number], Auth for inpatient rehab admission provided by Georgia Lopes at [facility] with updates due to Dorthula Nettles on 4/20 (phone [phone], fax [phone])      Employer:  Benefits:  Phone #: [phone]     Name:  Eff. Date: 12/24/2018     Deduct: $0      Out of Pocket Max: [phone] (met [phone])     Life Max: n/a CIR: $225/admission      SNF: $0/ days 1-20, $178/ days 21-100 Outpatient: $0     Co-Pay:  Home Health: 100%      Co-Pay:  DME: 80%     Co-Pay: 20%  SECONDARY: Medicaid       Policy#: [id number]      Subscriber: patient CM Name:       Phone#:      Fax#:  Pre-Cert#:       Employer:  Benefits:  Phone #:      Name:  Eff. Date:      Deduct:       Out of Pocket Max:       Life Max:  CIR:       SNF:  Outpatient:      Co-Pay:  Home Health:       Co-Pay:  DME:      Co-Pay:   Medicaid Application Date:       Case Manager:  Disability Application Date:       Case Worker:   The "Data Collection Information Summary" for patients in Inpatient Rehabilitation  Facilities with attached "Privacy Act Stamps Records" was provided and verbally reviewed with: Family  Emergency Contact Information  Contact Information    Name Relation Home Work [place]    [phone]   Jaymz, Traywick [phone]     PHEONIX, CLINKSCALE  [phone]   Griffin,Christie Daughter   [phone]     Current Medical History  Patient Admitting Diagnosis: debility with respiratory failure   History of Present Illness: BELL CAI a 78 year old male with history of COPDwith recent admission for exacerbation, chronic hyponatremia, alcohol abuse;who was admitted to [facility] on 03/30/2019 with 2-day history of cough and shortness of breath progressing to respiratory failure requiring intubation. He was transferred to [facility] on 4/7 and COVID-19 ruled out. Hypoxia felt to be due to a COPD exacerbation and HCAP likely due toaspiration.Hospital course significant for a-flutter requiring IV Cardizem for rate control as well as elevated troponins. He was treated with Lovenox with plans to transition to Eliquis. Cardiology recommends repeat echo in 1 month as well as 14 days to monitor for arrhythmias. He tolerated extubation on 4/11 and completed course of IV Unasyn. GOC discussed with family who has elected full scope of treatment. MBS 4/15 revealed aspiration of thins therefore placed on dysphagia 1 nectar liquids. Electrolyte abnormalities with hypokalemia andHypomagnesemiawithIVruns4/16. Has had issues with diarrhea, which has resolved.  Cardiology signed off on 4/11.  Therapy evaluations were completed and pt was referred for CIR.   Past Medical History      Past Medical History:  Diagnosis Date  . Alcohol abuse   . Allergy   . Anxiety   . Asthma   . COPD (chronic obstructive pulmonary disease) ([place])   . Coronary artery calcification seen on CT scan   . Depression   .  Hyponatremia   . Prostate cancer [facility])     Family History  family history includes Alcohol abuse in his daughter; Arthritis in his mother; Cancer in his brother, brother, brother, sister, sister, and sister; Chromosomal disorder in his sister; Diabetes in his sister; Heart disease in his father and mother; Rheum arthritis in his daughter and daughter; Stroke in his brother.  Prior Rehab/Hospitalizations:  Has the patient had prior rehab or hospitalizations prior to admission? Yes  Has the patient had major surgery during 100 days prior to admission? No  Current Medications   Current Facility-Administered Medications:  .  albuterol (PROVENTIL) (2.5 MG/3ML) 0.083% nebulizer solution 2.5 mg, 2.5 mg, Nebulization, Q3H PRN, Rai, Ripudeep K, MD .  Derrill Memo ON 04/11/2019] apixaban (ELIQUIS) tablet 5 mg, 5 mg, Oral, BID, Kris Mouton, RPH .  chlorhexidine (PERIDEX) 0.12 % solution 15 mL, 15 mL, Mouth Rinse, BID, Rai, Ripudeep K, MD, 15 mL at 04/10/19 0951 .  Chlorhexidine Gluconate Cloth 2 % PADS 6 each, 6 each, Topical, Q2200, Rai, Ripudeep K, MD, 6 each at 04/09/19 0005 .  clopidogrel (PLAVIX) tablet 75 mg, 75 mg, Per Tube, Daily, Rai, Ripudeep K, MD, 75 mg at 04/10/19 0952 .  fluticasone furoate-vilanterol (BREO ELLIPTA) 100-25 MCG/INH 1 puff, 1 puff, Inhalation, Daily, Rai, Ripudeep K, MD, 1 puff at 04/10/19 0809 .  folic acid (FOLVITE) tablet 1 mg, 1 mg, Per Tube, Daily, Rai, Ripudeep K, MD, 1 mg at 04/10/19 0952 .  MEDLINE mouth rinse, 15 mL, Mouth Rinse, q12n4p, Rai, Ripudeep K, MD, 15 mL at 04/09/19 1731 .  metoprolol tartrate (LOPRESSOR) tablet 12.5 mg, 12.5 mg, Oral, BID, Florencia Reasons, MD .  multivitamin with minerals tablet 1 tablet, 1 tablet, Oral, Daily, Kc, Ramesh, MD, 1 tablet at 04/10/19 0952 .  nystatin (  MYCOSTATIN) 100000 UNIT/ML suspension 500,000 Units, 5 mL, Oral, QID, Florencia Reasons, MD, 500,000 Units at 04/10/19 [phone] .  [DISCONTINUED] ondansetron (ZOFRAN) tablet 4 mg, 4 mg,  Oral, Q6H PRN **OR** ondansetron (ZOFRAN) injection 4 mg, 4 mg, Intravenous, Q6H PRN, Rai, Ripudeep K, MD .  pantoprazole (PROTONIX) EC tablet 40 mg, 40 mg, Oral, QHS, Florencia Reasons, MD, 40 mg at 04/09/19 2232 .  Resource ThickenUp Clear, , Oral, PRN, Antonieta Pert, MD .  saccharomyces boulardii (FLORASTOR) capsule 250 mg, 250 mg, Oral, BID, Florencia Reasons, MD, 250 mg at 04/10/19 0953 .  thiamine (VITAMIN B-1) tablet 100 mg, 100 mg, Per Tube, Daily, Rai, Ripudeep K, MD, 100 mg at 04/10/19 0953 .  umeclidinium bromide (INCRUSE ELLIPTA) 62.5 MCG/INH 1 puff, 1 puff, Inhalation, Daily, Rai, Ripudeep K, MD, 1 puff at 04/10/19 0809  Patients Current Diet:     Diet Order                  Diet - low sodium heart healthy         DIET - DYS 1 Room service appropriate? Yes with Assist; Fluid consistency: Nectar Thick  Diet effective now               Precautions / Restrictions Precautions Precautions: Fall Precaution Comments: on 02 at baseline Restrictions Weight Bearing Restrictions: No   Has the patient had 2 or more falls or a fall with injury in the past year?Yes  Prior Activity Level Limited Community (1-2x/wk): was not driving, need some assistance at baseline, only leaving for appointments  Prior Functional Level Prior Function Level of Independence: Needs assistance Gait / Transfers Assistance Needed: ambulated with RW ADL's / Homemaking Assistance Needed: assist from wife for bathing and dressing  Comments: Daughter's biggest concern is getting Dad to eat so that he can get better -improve energy and strength  Self Care: Did the patient need help bathing, dressing, using the toilet or eating?  Needed some help  Indoor Mobility: Did the patient need assistance with walking from room to room (with or without device)? Needed some help  Stairs: Did the patient need assistance with internal or external stairs (with or without device)? Needed some help  Functional  Cognition: Did the patient need help planning regular tasks such as shopping or remembering to take medications? Needed some help  Home Assistive Devices / Equipment Home Assistive Devices/Equipment: Oxygen, Cane (specify quad or straight) Home Equipment: Cane - single point, Bedside commode  Prior Device Use: Indicate devices/aids used by the patient prior to current illness, exacerbation or injury? Walker  Current Functional Level Cognition  Arousal/Alertness: Awake/alert Overall Cognitive Status: Impaired/Different from baseline Orientation Level: Oriented to person, Oriented to place, Oriented to time, Disoriented to situation Following Commands: Follows multi-step commands inconsistently, Follows one step commands with increased time Safety/Judgement: Decreased awareness of deficits General Comments: Pt seems to have improved cognition from previous sessions with SLP and other medical staff, but remains confused, able to follow simple commands with increased time.  Attention: Selective Selective Attention: Impaired Selective Attention Impairment: Verbal basic Memory: Impaired Memory Impairment: Storage deficit, Retrieval deficit, Decreased recall of new information Awareness: Impaired Awareness Impairment: Emergent impairment Problem Solving: Impaired Problem Solving Impairment: Verbal basic Behaviors: Impulsive Safety/Judgment: Impaired    Extremity Assessment (includes Sensation/Coordination)  Upper Extremity Assessment: Generalized weakness  Lower Extremity Assessment: Generalized weakness    ADLs  Overall ADL's : Needs assistance/impaired Eating/Feeding: Maximal assistance Grooming: Moderate assistance, Sitting Grooming Details (indicate  cue type and reason): requires supported sitting, or BUE support for grooming tasks Upper Body Bathing: Moderate assistance Lower Body Bathing: Maximal assistance Upper Body Dressing : Moderate assistance Lower Body Dressing:  Maximal assistance Lower Body Dressing Details (indicate cue type and reason): Pt unable to take off socks at this time Toilet Transfer: Maximal assistance, +2 for safety/equipment, [facility] Toilet Transfer Details (indicate cue type and reason): with stedy Toileting- Clothing Manipulation and Hygiene: Total assistance, Sit to/from stand Toileting - Clothing Manipulation Details (indicate cue type and reason): from stedy Tub/ Shower Transfer: Maximal assistance, +2 for physical assistance, +2 for safety/equipment Functional mobility during ADLs: Maximal assistance, +2 for physical assistance, +2 for safety/equipment, Rolling walker, Cueing for safety, Cueing for sequencing General ADL Comments: Pt demonstrates deficits in cognition, balance, also demonstrates generalized weakness and deconditioning impacting ability to perform ADL    Mobility  Overal bed mobility: Needs Assistance Bed Mobility: Supine to Sit Supine to sit: Min assist Sit to supine: Mod assist General bed mobility comments: minA to bring trunk to upright, pt able to move LE across bed to floor    Transfers  Overall transfer level: Needs assistance Equipment used: Rolling walker (2 wheeled) Transfer via Lift Equipment: Stedy Transfers: Sit to/from Stand Sit to Stand: From elevated surface, +2 physical assistance, Min assist, Min guard General transfer comment: minAx2 for initial power up to [place] from bed, once on Stedy pt able to perform 3 more x sit>stands, before requesting to use BSC. After toileting pt requires minA for powerup from lower surface, min guard to lower to recliner    Ambulation / Gait / Stairs / Wheelchair Mobility  Ambulation/Gait Ambulation/Gait assistance: Mod assist, +2 physical assistance Gait Distance (Feet): 3 Feet Assistive device: Rolling walker (2 wheeled) Gait Pattern/deviations: Step-to pattern, Shuffle, Trunk flexed General Gait Details: modAx2 for steadying, pt only able to take lateral  steps towards HoB with continued support posteriorly on LE from bed Gait velocity: slowed Gait velocity interpretation: <1.31 ft/sec, indicative of household ambulator    Posture / Balance Dynamic Sitting Balance Sitting balance - Comments: min initially, improved to min guard, pt frequently leaning forward to tripod on forearms, unable to withstand challenge to sitting balance Balance Overall balance assessment: Needs assistance Sitting-balance support: Feet supported, Bilateral upper extremity supported Sitting balance-Leahy Scale: Fair Sitting balance - Comments: min initially, improved to min guard, pt frequently leaning forward to tripod on forearms, unable to withstand challenge to sitting balance Postural control: Posterior lean Standing balance support: During functional activity, Bilateral upper extremity supported Standing balance-Leahy Scale: Poor Standing balance comment: B UE support of stedy and min assist    Special needs/care consideration BiPAP/CPAP no CPM no Continuous Drip IV no Dialysis no        Days n/a Life Vest no Oxygen currently on RA Special Bed no Trach Size no Wound Vac (area) no      Location n/a Skin cracking to bilateral UEs and heels, ecchymosis to bilateral UES                            Bowel mgmt: continent, last BM 04/10/2019 Bladder mgmt: incontinent Diabetic mgmt no Behavioral consideration no Chemo/radiation no     Previous Home Environment (from acute therapy documentation) Living Arrangements: Spouse/significant other  Lives With: Spouse Available Help at Discharge: Family Type of Home: House Home Layout: Multi-level, Able to live on main level with bedroom/bathroom Alternate Level Stairs-Number of Steps:  flight Home Access: Level entry Bathroom Shower/Tub: Chiropodist: Standard Home Care Services: No Additional Comments: Daughter lives about 25 min away, has 2 grown sons who can help as well. Pt and his  wife live in an old farmhouse that is heated by a wood stove.  Discharge Living Setting Plans for Discharge Living Setting: Patient's home Type of Home at Discharge: House Discharge Home Layout: Two level, Able to live on main level with bedroom/bathroom Alternate Level Stairs-Rails: Right Alternate Level Stairs-Number of Steps: full flight Discharge Home Access: Stairs to enter Entrance Stairs-Rails: None Entrance Stairs-Number of Steps: 2 Discharge Bathroom Shower/Tub: Tub/shower unit Discharge Bathroom Toilet: Standard Discharge Bathroom Accessibility: Yes How Accessible: Accessible via walker Does the patient have any problems obtaining your medications?: No  Social/Family/Support Systems Patient Roles: Spouse, Parent Anticipated Caregiver: daughter [name] Anticipated Ambulance person Information: [phone] Caregiver Availability: 24/7 Discharge Plan Discussed with Primary Caregiver: Yes Is Caregiver In Agreement with Plan?: Yes Does Caregiver/Family have Issues with Lodging/Transportation while Pt is in Rehab?: No   Goals/Additional Needs Patient/Family Goal for Rehab: PT/OT/SLP supervision to mod I Expected length of stay: 11-16 days Dietary Needs: D1/thin Equipment Needs: tbd Additional Information: Pt's daughter [name] has arranged for adult grandchildren and other family members to provide 24/7 supervision at discharge Pt/Family Agrees to Admission and willing to participate: Yes Program Orientation Provided & Reviewed with Pt/Caregiver Including Roles  & Responsibilities: Yes    Possible need for SNF placement upon discharge: not anticipated   Patient Condition: This patient's condition remains as documented in the consult dated 04/09/2019, in which the Rehabilitation Physician determined and documented that the patient's condition is appropriate for intensive rehabilitative care in an inpatient rehabilitation facility. Will admit to inpatient rehab  today.  Preadmission Screen Completed By:  Michel Santee, PT, DPT 04/10/2019 12:56 PM ______________________________________________________________________   Discussed status with Dr. Naaman Plummer on 04/10/19 at 1:10 PM  and received approval for admission today.  Admission Coordinator:  Michel Santee, time 1:10 PM Sudie Grumbling 04/10/19          Cosigned by: Meredith Staggers, MD at 04/10/2019 1:30 PM  Revision History

## 2019-04-10 NOTE — H&P (Signed)
Physical Medicine and Rehabilitation Admission H&P    Chief Complaint  Patient presents with   Debility    HPI:  Joshua Whitaker is a 78 year old male with history of COPD --oxygen dependent with recent admission for exacerbation, chronic hyponatremia, alcohol abuse; who was admitted to [facility] on 03/30/2019 with 2-day history of cough and shortness of breath progressing to respiratory failure requiring intubation.  He was transferred to [facility] on zero 4/7 and COVID-19 ruled out.  Hypoxia felt to be due a is COPD exacerbation and HCAP likely due to aspiration.  Hospital course significant for A flutter requiring IV Cardizem for rate control as well as elevated troponins.  He started on  Lovenox and transitioned to Eliquis--to continue at least 6 weeks--d/c ASA and outpatient Zio monitor for evaluation of arrhthymias.   He had an echo last month showing mild to moderate pericardial effusion without tamponade--to follow up with cards for echo in 1 month.  He tolerated extubation on 4/11 and completed course of IV Unasyn.    GOC discussed with family who has elected full scope of treatment.  MBS 4/15 revealed aspiration of thins therefore placed on dysphagia 1 nectar liquids.  Electrolyte abnormalities with hypokalemia and  Hypomagnesemia with IV runs 4/16.  Respiratory status improving but continues to have issues with weakness impacting ability to carry out ADLs and well as mobility. CIR recommended due to functional decline.    Review of Systems  Constitutional: Negative for chills and fever.  HENT: Negative for hearing loss and tinnitus.   Eyes: Negative for blurred vision and double vision.  Respiratory: Positive for shortness of breath. Negative for cough.   Cardiovascular: Negative for chest pain, palpitations and leg swelling.  Gastrointestinal: Negative for heartburn and nausea.  Genitourinary: Negative for dysuria and urgency.  Musculoskeletal: Positive for myalgias.  Skin: Negative for  rash.  Neurological: Positive for weakness. Negative for dizziness, speech change and headaches.  Psychiatric/Behavioral: The patient is not nervous/anxious.     Past Medical History:  Diagnosis Date   Alcohol abuse    Allergy    Anxiety    Asthma    COPD (chronic obstructive pulmonary disease) (HCC)    Coronary artery calcification seen on CT scan    Depression    Hyponatremia    Prostate cancer [facility])     Past Surgical History:  Procedure Laterality Date   EYE SURGERY     PROSTATE SURGERY     SPINE SURGERY      Family History  Problem Relation Age of Onset   Heart disease Mother    Arthritis Mother    Heart disease Father    Cancer Sister        kidney   Cancer Brother        lung   Cancer Brother        lung   Stroke Brother    Cancer Brother        prostate   Cancer Sister        colon   Cancer Sister        bone   Diabetes Sister    Chromosomal disorder Sister    Alcohol abuse Daughter    Rheum arthritis Daughter    Rheum arthritis Daughter     Social History:  Married. Used to work on a dairy farm. Has needed assist with ADLs and non-ambulatory since last admission.  He reports that he has been smoking cigarettes. He has been smoking about  200 pack per day. He has never used smokeless tobacco. He reports current alcohol use--2 beers daily and a pint of liquor per week. He reports that he does not use drugs.   Allergies  Allergen Reactions   Tiotropium Bromide Monohydrate Other (See Comments)    Severe urinary retention   Varenicline Other (See Comments)    Psychiatric and dreams   Iodinated Diagnostic Agents    Statins     Medications Prior to Admission  Medication Sig Dispense Refill   aspirin 325 MG tablet Take 650 mg by mouth daily as needed for mild pain.      atorvastatin (LIPITOR) 80 MG tablet Take 1 tablet (80 mg total) by mouth daily at 6 PM. 90 tablet 1   clopidogrel (PLAVIX) 75 MG tablet Take 1 tablet  (75 mg total) by mouth daily. 30 tablet 0   escitalopram (LEXAPRO) 5 MG tablet Take 5 mg by mouth every morning.     fluticasone (FLONASE) 50 MCG/ACT nasal spray Place 1 spray into both nostrils daily. 16 g 5   Fluticasone-Umeclidin-Vilant (TRELEGY ELLIPTA) 100-62.5-25 MCG/INH AEPB Inhale 1 puff into the lungs daily. 28 each 5   folic acid (FOLVITE) 1 MG tablet Take 1 tablet (1 mg total) by mouth daily. 30 tablet 0   Ipratropium-Albuterol (COMBIVENT RESPIMAT) 20-100 MCG/ACT AERS respimat Inhale 2 puffs into the lungs every 6 (six) hours. 1 Inhaler 5   megestrol (MEGACE) 400 MG/10ML suspension Take 10 mLs (400 mg total) by mouth 2 (two) times daily. For appetite stimulation 600 mL 2   predniSONE (DELTASONE) 10 MG tablet Take 5 daily for 3 days followed by 4,3,2 and 1 for 3 days each. 45 tablet 0   PROAIR HFA 108 (90 Base) MCG/ACT inhaler Inhale 1-2 puffs into the lungs every 6 (six) hours as needed for wheezing or shortness of breath. (Patient taking differently: Inhale 1-2 puffs into the lungs every 6 (six) hours as needed for wheezing or shortness of breath. ) 8.5 g 2   thiamine 100 MG tablet Take 1 tablet (100 mg total) by mouth daily. 30 tablet 0    Drug Regimen Review  Drug regimen was reviewed and remains appropriate with no significant issues identified  Home: Home Living Family/patient expects to be discharged to:: Private residence Living Arrangements: Spouse/significant other Available Help at Discharge: Family Type of Home: House Home Access: Level entry Home Layout: Multi-level, Able to live on main level with bedroom/bathroom Alternate Level Stairs-Number of Steps: flight Bathroom Shower/Tub: Chiropodist: Standard Home Equipment: Cane - single point, Bedside commode Additional Comments: Daughter lives about 25 min away, has 2 grown sons who can help as well. Pt and his wife live in an old farmhouse that is heated by a wood stove.  Lives With:  Spouse   Functional History: Prior Function Level of Independence: Needs assistance Gait / Transfers Assistance Needed: ambulated with RW ADL's / Homemaking Assistance Needed: assist from wife for bathing and dressing  Comments: Daughter's biggest concern is getting Dad to eat so that he can get better -improve energy and strength  Functional Status:  Mobility: Bed Mobility Overal bed mobility: Needs Assistance Bed Mobility: Supine to Sit Supine to sit: Min assist Sit to supine: Mod assist General bed mobility comments: minA to bring trunk to upright, pt able to move LE across bed to floor Transfers Overall transfer level: Needs assistance Equipment used: Rolling walker (2 wheeled) Transfer via Lift Equipment: Stedy Transfers: Sit to/from [employer] to  Stand: From elevated surface, +2 physical assistance, Min assist, Min guard General transfer comment: minAx2 for initial power up to [facility] from bed, once on Stedy pt able to perform 3 more x sit>stands, before requesting to use BSC. After toileting pt requires minA for powerup from lower surface, min guard to lower to recliner Ambulation/Gait Ambulation/Gait assistance: Mod assist, +2 physical assistance Gait Distance (Feet): 3 Feet Assistive device: Rolling walker (2 wheeled) Gait Pattern/deviations: Step-to pattern, Shuffle, Trunk flexed General Gait Details: modAx2 for steadying, pt only able to take lateral steps towards HoB with continued support posteriorly on LE from bed Gait velocity: slowed Gait velocity interpretation: <1.31 ft/sec, indicative of household ambulator    ADL: ADL Overall ADL's : Needs assistance/impaired Eating/Feeding: Maximal assistance Grooming: Moderate assistance, Sitting Grooming Details (indicate cue type and reason): requires supported sitting, or BUE support for grooming tasks Upper Body Bathing: Moderate assistance Lower Body Bathing: Maximal assistance Upper Body Dressing : Moderate  assistance Lower Body Dressing: Maximal assistance Lower Body Dressing Details (indicate cue type and reason): Pt unable to take off socks at this time Toilet Transfer: Maximal assistance, +2 for safety/equipment, [facility] Toilet Transfer Details (indicate cue type and reason): with stedy Toileting- Clothing Manipulation and Hygiene: Total assistance, Sit to/from stand Toileting - Clothing Manipulation Details (indicate cue type and reason): from stedy Tub/ Shower Transfer: Maximal assistance, +2 for physical assistance, +2 for safety/equipment Functional mobility during ADLs: Maximal assistance, +2 for physical assistance, +2 for safety/equipment, Rolling walker, Cueing for safety, Cueing for sequencing General ADL Comments: Pt demonstrates deficits in cognition, balance, also demonstrates generalized weakness and deconditioning impacting ability to perform ADL  Cognition: Cognition Overall Cognitive Status: Impaired/Different from baseline Arousal/Alertness: Awake/alert Orientation Level: Oriented to person, Oriented to place, Oriented to time, Disoriented to situation Attention: Selective Selective Attention: Impaired Selective Attention Impairment: Verbal basic Memory: Impaired Memory Impairment: Storage deficit, Retrieval deficit, Decreased recall of new information Awareness: Impaired Awareness Impairment: Emergent impairment Problem Solving: Impaired Problem Solving Impairment: Verbal basic Behaviors: Impulsive Safety/Judgment: Impaired Cognition Arousal/Alertness: Awake/alert Behavior During Therapy: Flat affect Overall Cognitive Status: Impaired/Different from baseline Area of Impairment: Memory, Following commands, Safety/judgement, Awareness, Problem solving Memory: Decreased short-term memory Following Commands: Follows multi-step commands inconsistently, Follows one step commands with increased time Safety/Judgement: Decreased awareness of deficits Awareness:  Intellectual Problem Solving: Slow processing, Decreased initiation, Difficulty sequencing, Requires verbal cues, Requires tactile cues General Comments: Pt seems to have improved cognition from previous sessions with SLP and other medical staff, but remains confused, able to follow simple commands with increased time.    Blood pressure 119/66, pulse 80, temperature 97.8 F (36.6 C), temperature source Oral, resp. rate 18, height 5\' 10"  (1.778 m), weight 52.5 kg, SpO2 99 %. Physical Exam  Nursing note and vitals reviewed. Constitutional: He appears well-developed. No distress. Nasal cannula in place.  Thin, frail appearing.   HENT:  Head: Normocephalic and atraumatic.  Eyes: Pupils are equal, round, and reactive to light. EOM are normal.  Neck: Normal range of motion. No thyromegaly present.  Cardiovascular: Normal rate. Exam reveals no friction rub.  Respiratory: Effort normal. No respiratory distress.  [place]phraim  Neurological: He is alert.  More oriented today but does not recall events leading to hospitalization. Able to follow simple motor commands without difficulty. Perseverating on calling wife, wants her to take him home. UE 4/5 prox to distal. LE: 3/5 HF, KE and 4/5 distally. Senses pain and light touch in all 4's.   Skin: Skin is warm.  He is not diaphoretic.  Psychiatric:  Distracted, anxious at times    Results for orders placed or performed during the hospital encounter of 03/30/19 (from the past 48 hour(s))  Glucose, capillary     Status: Abnormal   Collection Time: 04/08/19 12:17 PM  Result Value Ref Range   Glucose-Capillary 139 (H) 70 - 99 mg/dL  Glucose, capillary     Status: Abnormal   Collection Time: 04/08/19  5:11 PM  Result Value Ref Range   Glucose-Capillary 112 (H) 70 - 99 mg/dL  Glucose, capillary     Status: None   Collection Time: 04/09/19 12:21 AM  Result Value Ref Range   Glucose-Capillary 70 70 - 99 mg/dL   Comment 1 Notify RN    Comment 2 Document in  Chart   CBC     Status: Abnormal   Collection Time: 04/09/19  4:43 AM  Result Value Ref Range   WBC 11.6 (H) 4.0 - 10.5 K/uL   RBC 4.61 4.22 - 5.81 MIL/uL   Hemoglobin 13.4 13.0 - 17.0 g/dL   HCT 40.2 39.0 - 52.0 %   MCV 87.2 80.0 - 100.0 fL   MCH 29.1 26.0 - 34.0 pg   MCHC 33.3 30.0 - 36.0 g/dL   RDW 13.3 11.5 - 15.5 %   Platelets 254 150 - 400 K/uL   nRBC 0.0 0.0 - 0.2 %    Comment: Performed at [facility], [place] [address], [place], [place] [address]  Comprehensive metabolic panel     Status: Abnormal   Collection Time: 04/09/19  4:43 AM  Result Value Ref Range   Sodium 136 135 - 145 mmol/L   Potassium 3.3 (L) 3.5 - 5.1 mmol/L    Comment: NO VISIBLE HEMOLYSIS   Chloride 102 98 - 111 mmol/L   CO2 23 22 - 32 mmol/L   Glucose, Bld 82 70 - 99 mg/dL   BUN 11 8 - 23 mg/dL   Creatinine, Ser 0.58 (L) 0.61 - 1.24 mg/dL   Calcium 7.9 (L) 8.9 - 10.3 mg/dL   Total Protein 5.4 (L) 6.5 - 8.1 g/dL   Albumin 2.5 (L) 3.5 - 5.0 g/dL   AST 61 (H) 15 - 41 U/L   ALT 247 (H) 0 - 44 U/L   Alkaline Phosphatase 63 38 - 126 U/L   Total Bilirubin 0.8 0.3 - 1.2 mg/dL   GFR calc non Af Amer >60 >60 mL/min   GFR calc Af Amer >60 >60 mL/min   Anion gap 11 5 - 15    Comment: Performed at [facility], [place] [address] [place], [place] [address]  Magnesium     Status: Abnormal   Collection Time: 04/09/19  4:43 AM  Result Value Ref Range   Magnesium 1.6 (L) 1.7 - 2.4 mg/dL    Comment: Performed at [facility] [address], [place], [place] [address]  Phosphorus     Status: Abnormal   Collection Time: 04/09/19  4:43 AM  Result Value Ref Range   Phosphorus 2.2 (L) 2.5 - 4.6 mg/dL    Comment: Performed at [facility] [address] [place], [place] [address]  Glucose, capillary     Status: None   Collection Time: 04/09/19  5:54 AM  Result Value Ref Range   Glucose-Capillary 76 70 - 99 mg/dL   Comment 1 Notify RN    Comment 2 Document in Chart   Glucose, capillary  Status: None   Collection Time: 04/09/19 11:53 AM  Result Value Ref Range   Glucose-Capillary 98 70 - 99 mg/dL  Glucose, capillary     Status: None   Collection Time: 04/09/19  4:26 PM  Result Value Ref Range   Glucose-Capillary 82 70 - 99 mg/dL  Glucose, capillary     Status: None   Collection Time: 04/09/19 10:18 PM  Result Value Ref Range   Glucose-Capillary 77 70 - 99 mg/dL   Comment 1 Notify RN    Comment 2 Document in Chart   Glucose, capillary     Status: None   Collection Time: 04/10/19 12:04 AM  Result Value Ref Range   Glucose-Capillary 79 70 - 99 mg/dL   Comment 1 Notify RN    Comment 2 Document in Chart   Glucose, capillary     Status: None   Collection Time: 04/10/19  4:21 AM  Result Value Ref Range   Glucose-Capillary 76 70 - 99 mg/dL   Comment 1 Notify RN    Comment 2 Document in Chart   Comprehensive metabolic panel     Status: Abnormal   Collection Time: 04/10/19  5:04 AM  Result Value Ref Range   Sodium 136 135 - 145 mmol/L   Potassium 3.9 3.5 - 5.1 mmol/L   Chloride 103 98 - 111 mmol/L   CO2 29 22 - 32 mmol/L   Glucose, Bld 85 70 - 99 mg/dL   BUN 14 8 - 23 mg/dL   Creatinine, Ser 0.58 (L) 0.61 - 1.24 mg/dL   Calcium 8.1 (L) 8.9 - 10.3 mg/dL   Total Protein 5.1 (L) 6.5 - 8.1 g/dL   Albumin 2.5 (L) 3.5 - 5.0 g/dL   AST 47 (H) 15 - 41 U/L   ALT 199 (H) 0 - 44 U/L   Alkaline Phosphatase 60 38 - 126 U/L   Total Bilirubin 1.0 0.3 - 1.2 mg/dL   GFR calc non Af Amer >60 >60 mL/min   GFR calc Af Amer >60 >60 mL/min   Anion gap 4 (L) 5 - 15    Comment: Performed at [facility], [address], [place], [place] [address]  Magnesium     Status: None   Collection Time: 04/10/19  5:04 AM  Result Value Ref Range   Magnesium 1.8 1.7 - 2.4 mg/dL    Comment: Performed at [facility] [address], [place], [place] [address]  Glucose, capillary     Status: None   Collection Time: 04/10/19  6:06 AM  Result Value Ref Range   Glucose-Capillary 75 70  - 99 mg/dL   Comment 1 Notify RN    Comment 2 Document in Chart    Dg Chest 2 View  Result Date: 04/09/2019 CLINICAL DATA:  Respiratory failure.  Possible pneumonia. EXAM: CHEST - 2 VIEW COMPARISON:  04/02/2019 and 01/06/2019 FINDINGS: Patient slightly rotated to the right. Lungs are adequately inflated and demonstrate evidence of emphysematous disease with left upper lobe scarring. No focal lobar consolidation. Small amount of bilateral pleural fluid seen posteriorly on the lateral film. No pneumothorax. Cardiomediastinal silhouette is within normal. Remainder of the exam is unchanged. IMPRESSION: Small bilateral pleural effusions. Emphysematous disease. Electronically Signed   By: Marin Olp M.D.   On: 04/09/2019 09:08       Medical Problem List and Plan: 1.  Functional and mobility deficits secondary to debility and encephalopathy after respiratory failure  -admit to inpatient rehab 2.  Antithrombotics: -DVT/anticoagulation:  Pharmaceutical: Other (comment)--Eliquis  -antiplatelet therapy: Plavix (no ASA) 3. Pain Management: tylenol prn as well as local measures prn.  4. Mood: LCSW to follow for evaluation and support.   -antipsychotic agents: N/A 5. Neuropsych: This patient is not capable of making decisions on his own behalf. 6. Skin/Wound Care: routine pressure relief measures.  7. Fluids/Electrolytes/Nutrition: Monitor I/O. Check BMET and Mg levels Monday.  8. CAD/A Flutter: Continue metoprolol 12.5 mg bid with Apixaban and Plavix.  9. COPD- oxygen dependent: Respiratory status improving. Continue Incruse and Ellipta daily with albuterol prn.  10 Abnormal LFTs: Improving--recheck on Monday.  11. Leucocytosis: Resolving. Continue to monitor for fevers or other signs of infection.  12. Malnutrition: Will add prostat. Recheck Mg, Phos levels on Monday.   Encourage PO, ?appetite stimulant     Bary Leriche, [place] 04/10/2019

## 2019-04-10 NOTE — Progress Notes (Addendum)
Nutrition Follow-up  DOCUMENTATION CODES:   Severe malnutrition in context of chronic illness, Underweight  INTERVENTION:   -Magic Cup TID with meals, each supplement provides 290 kcals and 9 grams protein -Hormel Shake TID with meals, each supplement provides 520 kcals and 22 grams protein -MVI with minerals daily  NUTRITION DIAGNOSIS:   Severe Malnutrition related to chronic illness(COPD, ETOH abuse) as evidenced by moderate fat depletion, severe fat depletion, moderate muscle depletion, severe muscle depletion.  Ongoing  GOAL:   Patient will meet greater than or equal to 90% of their needs  Progressing  MONITOR:   PO intake, Supplement acceptance, Diet advancement, Labs, Weight trends, Skin, I & O's  REASON FOR ASSESSMENT:   Consult Enteral/tube feeding initiation and management, Assessment of nutrition requirement/status  ASSESSMENT:   Patient presents to [facility] from home with 2-days increased SOB / COPD exacerbation. Additional hx of HTN, alcohol and tobacco abuse. Transferred to [facility] MICU and intubated on 4/8. R/O COVID-19.  4/8- transferred to [facility] from [facility]; intubated 4/11- extubated 4/12- s/p Palliative Care consult- family desires aggressive measures; s/p BSE- recommend NPO 4/13- NGT placed, TF initiated, COVID-19 negative per MD note; transferred from SDU to floor; pt removed NGT 4/14- s/p MBSS- advanced to dysphagia 1 diet with nectar thick liquids  Review I/O's: -355 ml x 24 hours and -5.5 L since admission  UOP: -1 L x 24 hours  Spoke with pt at bedside, who was eating lunch at time of visit (had already consumed 50% of mashed potatoes and carrots). Pt reports his appetite and swallow function has improved. Noted documented meal completion 15-25%. Ensure was d/c; per pt "I don't drink that; it makes me throw up". He enjoys Optician, dispensing and thickened milk. Discussed importance of good meal and supplement intake for healing.  Per SLP notes, plan to continue with  dysphagia 1 diet with nectar thick liquids.   Noted pt with 12.7% wt loss x 1 week; pt -5.5 L since admission, however, also with severe fat and muscle depletion.   Per MD, plan to transfer to CIR today.   Labs reviewed.   NUTRITION - FOCUSED PHYSICAL EXAM:    Most Recent Value  Orbital Region  Severe depletion  Upper Arm Region  Severe depletion  Thoracic and Lumbar Region  Severe depletion  Buccal Region  Severe depletion  Temple Region  Severe depletion  Clavicle Bone Region  Severe depletion  Clavicle and Acromion Bone Region  Severe depletion  Scapular Bone Region  Severe depletion  Dorsal Hand  Severe depletion  Patellar Region  Severe depletion  Anterior Thigh Region  Severe depletion  Posterior Calf Region  Severe depletion  Edema (RD Assessment)  None  Hair  Reviewed  Eyes  Reviewed  Mouth  Reviewed  Skin  Reviewed  Nails  Reviewed       Diet Order:   Diet Order            Diet - low sodium heart healthy        DIET - DYS 1 Room service appropriate? Yes with Assist; Fluid consistency: Nectar Thick  Diet effective now              EDUCATION NEEDS:   Education needs have been addressed  Skin:  Skin Assessment: Reviewed RN Assessment  Last BM:  04/10/19  Height:   Ht Readings from Last 1 Encounters:  04/01/19 5\' 10"  (1.778 m)    Weight:   Wt Readings from Last 1 Encounters:  04/10/19 52.5 kg    Ideal Body Weight:  75 kg  BMI:  Body mass index is 16.61 kg/m.  Estimated Nutritional Needs:   Kcal:  1900-2100  Protein:  10-120 grams  Fluid:  1.9-2.1 L    Lavra Imler A. Jimmye Norman, RD, LDN, [place] Registered Dietitian II Certified Diabetes Care and Education Specialist Pager: [phone] After hours Pager: [phone]

## 2019-04-10 NOTE — Consult Note (Signed)
   [facility] CM Inpatient Consult   04/10/2019  Joshua Whitaker 1941/08/23 [id number]  Patient screened for high risk score and hospitalizations to check if potential [facility] services are needed.  Chart reviews reveals patient is transitioning to inpatient rehab. Please place a [facility] Care Management consult or for questions contact:    [name], RN BSN [facility] Liaison  [phone] business mobile phone Toll free office [phone]

## 2019-04-10 NOTE — PMR Pre-admission (Signed)
PMR Admission Coordinator Pre-Admission Assessment  Patient: Joshua Whitaker is an 78 y.o., male MRN: [id number] DOB: 07-07-1941 Height: '5\' 10"'$  (177.8 cm) Weight: 52.5 kg              Insurance Information HMO: yes    PPO:      PCP:      IPA:      80/20:      OTHER:  PRIMARY: [employer] Medicare      Policy#: [id number]      Subscriber: patient CM Name: Joshua Whitaker   Phone#: [phone]     Fax#:  Pre-Cert#: [id number], Auth for inpatient rehab admission provided by Joshua Whitaker at [facility] with updates due to Dorthula Nettles on 4/20 (phone [phone], fax [phone])      Employer:  Benefits:  Phone #: [phone]     Name:  Eff. Date: 12/24/2018     Deduct: $0      Out of Pocket Max: [phone] (met [phone])     Life Max: n/a CIR: $225/admission      SNF: $0/ days 1-20, $178/ days 21-100 Outpatient: $0     Co-Pay:  Home Health: 100%      Co-Pay:  DME: 80%     Co-Pay: 20%  SECONDARY: Medicaid       Policy#: [id number]      Subscriber: patient CM Name:       Phone#:      Fax#:  Pre-Cert#:       Employer:  Benefits:  Phone #:      Name:  Eff. Date:      Deduct:       Out of Pocket Max:       Life Max:  CIR:       SNF:  Outpatient:      Co-Pay:  Home Health:       Co-Pay:  DME:      Co-Pay:   Medicaid Application Date:       Case Manager:  Disability Application Date:       Case Worker:   The "Data Collection Information Summary" for patients in Inpatient Rehabilitation Facilities with attached "Privacy Act [place] Records" was provided and verbally reviewed with: Family  Emergency Contact Information Contact Information    Name Relation Home Work [place]    [phone]   Marro,Joyce Spouse [phone]     TRAYSON, STITELY  [phone]   Griffin,Christie Daughter   [phone]     Current Medical History  Patient Admitting Diagnosis: debility with respiratory failure   History of Present Illness: Joshua Whitaker is a 78 year old male  with history of COPD with recent admission for exacerbation, chronic hyponatremia, alcohol abuse; who was admitted to [facility] on 03/30/2019 with 2-day history of cough and shortness of breath progressing to respiratory failure requiring intubation.  He was transferred to [facility] on 4/7 and COVID-19 ruled out.  Hypoxia felt to be due to a COPD exacerbation and HCAP likely due to aspiration.  Hospital course significant for a-flutter requiring IV Cardizem for rate control as well as elevated troponins.  He was treated with Lovenox with plans to transition to Eliquis.  Cardiology recommends repeat echo in 1 month as well as 14 days to monitor for arrhythmias.  He tolerated extubation on 4/11 and completed course of IV Unasyn.  GOC discussed with family who has elected full scope of treatment.  MBS 4/15 revealed aspiration of thins therefore placed on dysphagia 1 nectar  liquids.  Electrolyte abnormalities with hypokalemia and Hypomagnesemia with IV runs 4/16.  Has had issues with diarrhea, which has resolved.  Cardiology signed off on 4/11.  Therapy evaluations were completed and pt was referred for CIR.   Past Medical History  Past Medical History:  Diagnosis Date  . Alcohol abuse   . Allergy   . Anxiety   . Asthma   . COPD (chronic obstructive pulmonary disease) ([place])   . Coronary artery calcification seen on CT scan   . Depression   . Hyponatremia   . Prostate cancer [facility])     Family History  family history includes Alcohol abuse in his daughter; Arthritis in his mother; Cancer in his brother, brother, brother, sister, sister, and sister; Chromosomal disorder in his sister; Diabetes in his sister; Heart disease in his father and mother; Rheum arthritis in his daughter and daughter; Stroke in his brother.  Prior Rehab/Hospitalizations:  Has the patient had prior rehab or hospitalizations prior to admission? Yes  Has the patient had major surgery during 100 days prior to admission? No  Current  Medications   Current Facility-Administered Medications:  .  albuterol (PROVENTIL) (2.5 MG/3ML) 0.083% nebulizer solution 2.5 mg, 2.5 mg, Nebulization, Q3H PRN, Rai, Ripudeep K, MD .  Derrill Memo ON 04/11/2019] apixaban (ELIQUIS) tablet 5 mg, 5 mg, Oral, BID, Kris Mouton, RPH .  chlorhexidine (PERIDEX) 0.12 % solution 15 mL, 15 mL, Mouth Rinse, BID, Rai, Ripudeep K, MD, 15 mL at 04/10/19 0951 .  Chlorhexidine Gluconate Cloth 2 % PADS 6 each, 6 each, Topical, Q2200, Rai, Ripudeep K, MD, 6 each at 04/09/19 0005 .  clopidogrel (PLAVIX) tablet 75 mg, 75 mg, Per Tube, Daily, Rai, Ripudeep K, MD, 75 mg at 04/10/19 0952 .  fluticasone furoate-vilanterol (BREO ELLIPTA) 100-25 MCG/INH 1 puff, 1 puff, Inhalation, Daily, Rai, Ripudeep K, MD, 1 puff at 04/10/19 0809 .  folic acid (FOLVITE) tablet 1 mg, 1 mg, Per Tube, Daily, Rai, Ripudeep K, MD, 1 mg at 04/10/19 0952 .  MEDLINE mouth rinse, 15 mL, Mouth Rinse, q12n4p, Rai, Ripudeep K, MD, 15 mL at 04/09/19 1731 .  metoprolol tartrate (LOPRESSOR) tablet 12.5 mg, 12.5 mg, Oral, BID, Florencia Reasons, MD .  multivitamin with minerals tablet 1 tablet, 1 tablet, Oral, Daily, Kc, Ramesh, MD, 1 tablet at 04/10/19 0952 .  nystatin (MYCOSTATIN) 100000 UNIT/ML suspension 500,000 Units, 5 mL, Oral, QID, Florencia Reasons, MD, 500,000 Units at 04/10/19 [phone] .  [DISCONTINUED] ondansetron (ZOFRAN) tablet 4 mg, 4 mg, Oral, Q6H PRN **OR** ondansetron (ZOFRAN) injection 4 mg, 4 mg, Intravenous, Q6H PRN, Rai, Ripudeep K, MD .  pantoprazole (PROTONIX) EC tablet 40 mg, 40 mg, Oral, QHS, Florencia Reasons, MD, 40 mg at 04/09/19 2232 .  Resource ThickenUp Clear, , Oral, PRN, Antonieta Pert, MD .  saccharomyces boulardii (FLORASTOR) capsule 250 mg, 250 mg, Oral, BID, Florencia Reasons, MD, 250 mg at 04/10/19 0953 .  thiamine (VITAMIN B-1) tablet 100 mg, 100 mg, Per Tube, Daily, Rai, Ripudeep K, MD, 100 mg at 04/10/19 0953 .  umeclidinium bromide (INCRUSE ELLIPTA) 62.5 MCG/INH 1 puff, 1 puff, Inhalation, Daily, Rai,  Ripudeep K, MD, 1 puff at 04/10/19 0809  Patients Current Diet:  Diet Order            Diet - low sodium heart healthy        DIET - DYS 1 Room service appropriate? Yes with Assist; Fluid consistency: Nectar Thick  Diet effective now  Precautions / Restrictions Precautions Precautions: Fall Precaution Comments: on 02 at baseline Restrictions Weight Bearing Restrictions: No   Has the patient had 2 or more falls or a fall with injury in the past year?Yes  Prior Activity Level Limited Community (1-2x/wk): was not driving, need some assistance at baseline, only leaving for appointments  Prior Functional Level Prior Function Level of Independence: Needs assistance Gait / Transfers Assistance Needed: ambulated with RW ADL's / Homemaking Assistance Needed: assist from wife for bathing and dressing  Comments: Daughter's biggest concern is getting Dad to eat so that he can get better -improve energy and strength  Self Care: Did the patient need help bathing, dressing, using the toilet or eating?  Needed some help  Indoor Mobility: Did the patient need assistance with walking from room to room (with or without device)? Needed some help  Stairs: Did the patient need assistance with internal or external stairs (with or without device)? Needed some help  Functional Cognition: Did the patient need help planning regular tasks such as shopping or remembering to take medications? Needed some help  Home Assistive Devices / Equipment Home Assistive Devices/Equipment: Oxygen, Cane (specify quad or straight) Home Equipment: Cane - single point, Bedside commode  Prior Device Use: Indicate devices/aids used by the patient prior to current illness, exacerbation or injury? Walker  Current Functional Level Cognition  Arousal/Alertness: Awake/alert Overall Cognitive Status: Impaired/Different from baseline Orientation Level: Oriented to person, Oriented to place, Oriented to  time, Disoriented to situation Following Commands: Follows multi-step commands inconsistently, Follows one step commands with increased time Safety/Judgement: Decreased awareness of deficits General Comments: Pt seems to have improved cognition from previous sessions with SLP and other medical staff, but remains confused, able to follow simple commands with increased time.  Attention: Selective Selective Attention: Impaired Selective Attention Impairment: Verbal basic Memory: Impaired Memory Impairment: Storage deficit, Retrieval deficit, Decreased recall of new information Awareness: Impaired Awareness Impairment: Emergent impairment Problem Solving: Impaired Problem Solving Impairment: Verbal basic Behaviors: Impulsive Safety/Judgment: Impaired    Extremity Assessment (includes Sensation/Coordination)  Upper Extremity Assessment: Generalized weakness  Lower Extremity Assessment: Generalized weakness    ADLs  Overall ADL's : Needs assistance/impaired Eating/Feeding: Maximal assistance Grooming: Moderate assistance, Sitting Grooming Details (indicate cue type and reason): requires supported sitting, or BUE support for grooming tasks Upper Body Bathing: Moderate assistance Lower Body Bathing: Maximal assistance Upper Body Dressing : Moderate assistance Lower Body Dressing: Maximal assistance Lower Body Dressing Details (indicate cue type and reason): Pt unable to take off socks at this time Toilet Transfer: Maximal assistance, +2 for safety/equipment, [facility] Toilet Transfer Details (indicate cue type and reason): with stedy Toileting- Clothing Manipulation and Hygiene: Total assistance, Sit to/from stand Toileting - Clothing Manipulation Details (indicate cue type and reason): from stedy Tub/ Shower Transfer: Maximal assistance, +2 for physical assistance, +2 for safety/equipment Functional mobility during ADLs: Maximal assistance, +2 for physical assistance, +2 for safety/equipment,  Rolling walker, Cueing for safety, Cueing for sequencing General ADL Comments: Pt demonstrates deficits in cognition, balance, also demonstrates generalized weakness and deconditioning impacting ability to perform ADL    Mobility  Overal bed mobility: Needs Assistance Bed Mobility: Supine to Sit Supine to sit: Min assist Sit to supine: Mod assist General bed mobility comments: minA to bring trunk to upright, pt able to move LE across bed to floor    Transfers  Overall transfer level: Needs assistance Equipment used: Rolling walker (2 wheeled) Transfer via Lift Equipment: Stedy Transfers: Sit to/from [employer]  to Stand: From elevated surface, +2 physical assistance, Min assist, Min guard General transfer comment: minAx2 for initial power up to [facility] from bed, once on Stedy pt able to perform 3 more x sit>stands, before requesting to use BSC. After toileting pt requires minA for powerup from lower surface, min guard to lower to recliner    Ambulation / Gait / Stairs / Wheelchair Mobility  Ambulation/Gait Ambulation/Gait assistance: Mod assist, +2 physical assistance Gait Distance (Feet): 3 Feet Assistive device: Rolling walker (2 wheeled) Gait Pattern/deviations: Step-to pattern, Shuffle, Trunk flexed General Gait Details: modAx2 for steadying, pt only able to take lateral steps towards HoB with continued support posteriorly on LE from bed Gait velocity: slowed Gait velocity interpretation: <1.31 ft/sec, indicative of household ambulator    Posture / Balance Dynamic Sitting Balance Sitting balance - Comments: min initially, improved to min guard, pt frequently leaning forward to tripod on forearms, unable to withstand challenge to sitting balance Balance Overall balance assessment: Needs assistance Sitting-balance support: Feet supported, Bilateral upper extremity supported Sitting balance-Leahy Scale: Fair Sitting balance - Comments: min initially, improved to min guard, pt  frequently leaning forward to tripod on forearms, unable to withstand challenge to sitting balance Postural control: Posterior lean Standing balance support: During functional activity, Bilateral upper extremity supported Standing balance-Leahy Scale: Poor Standing balance comment: B UE support of stedy and min assist    Special needs/care consideration BiPAP/CPAP no CPM no Continuous Drip IV no Dialysis no        Days n/a Life Vest no Oxygen currently on RA Special Bed no Trach Size no Wound Vac (area) no      Location n/a Skin cracking to bilateral UEs and heels, ecchymosis to bilateral UES                            Bowel mgmt: continent, last BM 04/10/2019 Bladder mgmt: incontinent Diabetic mgmt no Behavioral consideration no Chemo/radiation no     Previous Home Environment (from acute therapy documentation) Living Arrangements: Spouse/significant other  Lives With: Spouse Available Help at Discharge: Family Type of Home: House Home Layout: Multi-level, Able to live on main level with bedroom/bathroom Alternate Level Stairs-Number of Steps: flight Home Access: Level entry Bathroom Shower/Tub: Chiropodist: Standard Home Care Services: No Additional Comments: Daughter lives about 25 min away, has 2 grown sons who can help as well. Pt and his wife live in an old farmhouse that is heated by a wood stove.  Discharge Living Setting Plans for Discharge Living Setting: Patient's home Type of Home at Discharge: House Discharge Home Layout: Two level, Able to live on main level with bedroom/bathroom Alternate Level Stairs-Rails: Right Alternate Level Stairs-Number of Steps: full flight Discharge Home Access: Stairs to enter Entrance Stairs-Rails: None Entrance Stairs-Number of Steps: 2 Discharge Bathroom Shower/Tub: Tub/shower unit Discharge Bathroom Toilet: Standard Discharge Bathroom Accessibility: Yes How Accessible: Accessible via walker Does the  patient have any problems obtaining your medications?: No  Social/Family/Support Systems Patient Roles: Spouse, Parent Anticipated Caregiver: daughter [name] Anticipated Ambulance person Information: [phone] Caregiver Availability: 24/7 Discharge Plan Discussed with Primary Caregiver: Yes Is Caregiver In Agreement with Plan?: Yes Does Caregiver/Family have Issues with Lodging/Transportation while Pt is in Rehab?: No   Goals/Additional Needs Patient/Family Goal for Rehab: PT/OT/SLP supervision to mod I Expected length of stay: 11-16 days Dietary Needs: D1/thin Equipment Needs: tbd Additional Information: Pt's daughter [name] has arranged for adult grandchildren and other family  members to provide 24/7 supervision at discharge Pt/Family Agrees to Admission and willing to participate: Yes Program Orientation Provided & Reviewed with Pt/Caregiver Including Roles  & Responsibilities: Yes    Possible need for SNF placement upon discharge: not anticipated   Patient Condition: This patient's condition remains as documented in the consult dated 04/09/2019, in which the Rehabilitation Physician determined and documented that the patient's condition is appropriate for intensive rehabilitative care in an inpatient rehabilitation facility. Will admit to inpatient rehab today.  Preadmission Screen Completed By:  Michel Santee, PT, DPT 04/10/2019 12:56 PM ______________________________________________________________________   Discussed status with Dr. Naaman Plummer on 04/10/19 at 1:10 PM  and received approval for admission today.  Admission Coordinator:  Michel Santee, time 1:10 PM Sudie Grumbling 04/10/19

## 2019-04-10 NOTE — Progress Notes (Signed)
Physical Therapy Treatment Patient Details Name: Joshua Whitaker MRN: [id number] DOB: [birth year] Today's Date: 04/10/2019    History of Present Illness 78 y.o. male, with history of COPD, alcohol abuse, tobacco abuse, hypertension, hyponatremia was brought to the ED for shortness of breath. Patient was recently admitted to the hospital for concern for MI at that time he did not have cardiac catheterization. In ED patient was found to be in new onset atrial flutter, chest x-ray showed possibility of pneumonia. Developed respiratory failure intubated 4/7-4/11    PT Comments    Pt pleasant and initially stated he wasn't sure if he wanted to move but was willing to get stronger and progress activity. Pt with increased ability with transfers, and gait with RW but remains limited by fatigue and SOB. Will continue to follow and recommend OOB daily with nursing staff.  Pt on RA on arrival without ability to obtain waveform or reading for SPO2. Pt requesting O2 with transfers and on 2L end of session sats 93% with HR 86    Follow Up Recommendations  CIR;Supervision/Assistance - 24 hour     Equipment Recommendations  Rolling walker with 5" wheels    Recommendations for Other Services       Precautions / Restrictions Precautions Precautions: Fall Precaution Comments: watch sats Restrictions Weight Bearing Restrictions: No    Mobility  Bed Mobility Overal bed mobility: Needs Assistance Bed Mobility: Supine to Sit     Supine to sit: HOB elevated;Min guard     General bed mobility comments: pt able to pivot from supine to sitting with use of rail and HOB 40 degrees without physical assist and increased time  Transfers Overall transfer level: Needs assistance   Transfers: Sit to/from Stand Sit to Stand: Min guard         General transfer comment: pt able to stand from bed to stedy, from stedy, from [facility] to stedy, from recliner all with only cues. Pt transferred from bed to [facility]  with use of stedy and back to chair then walked  Ambulation/Gait Ambulation/Gait assistance: Min assist Gait Distance (Feet): 8 Feet Assistive device: Rolling walker (2 wheeled) Gait Pattern/deviations: Step-through pattern;Decreased stride length;Trunk flexed   Gait velocity interpretation: <1.8 ft/sec, indicate of risk for recurrent falls General Gait Details: pt able to initiate walking with RW today but after 5' became fatigued and returned to chair. Cues for posture, position in RW and safety   Stairs             Wheelchair Mobility    Modified Rankin (Stroke Patients Only)       Balance Overall balance assessment: Needs assistance Sitting-balance support: Feet supported;Single extremity supported Sitting balance-Leahy Scale: Fair     Standing balance support: During functional activity;Bilateral upper extremity supported Standing balance-Leahy Scale: Poor Standing balance comment: B UE support for standing                            Cognition Arousal/Alertness: Awake/alert Behavior During Therapy: Flat affect Overall Cognitive Status: Impaired/Different from baseline Area of Impairment: Memory;Following commands;Safety/judgement;Awareness;Problem solving                     Memory: Decreased short-term memory Following Commands: Follows one step commands consistently;Follows multi-step commands inconsistently Safety/Judgement: Decreased awareness of deficits   Problem Solving: Slow processing;Decreased initiation;Difficulty sequencing;Requires verbal cues;Requires tactile cues General Comments: pt alert and oriented but not to date (calendar on wrong date),  pt unaware of need to void until immediate need      Exercises      General Comments        Pertinent Vitals/Pain Pain Assessment: No/denies pain    Home Living                      Prior Function            PT Goals (current goals can now be found in the care  plan section) Progress towards PT goals: Progressing toward goals    Frequency    Min 4X/week      PT Plan Current plan remains appropriate    Co-evaluation              AM-PAC PT "6 Clicks" Mobility   Outcome Measure  Help needed turning from your back to your side while in a flat bed without using bedrails?: A Little Help needed moving from lying on your back to sitting on the side of a flat bed without using bedrails?: A Little Help needed moving to and from a bed to a chair (including a wheelchair)?: A Little Help needed standing up from a chair using your arms (e.g., wheelchair or bedside chair)?: A Little Help needed to walk in hospital room?: A Little Help needed climbing 3-5 steps with a railing? : Total 6 Click Score: 16    End of Session Equipment Utilized During Treatment: Gait belt Activity Tolerance: Patient limited by fatigue Patient left: in chair;with chair alarm set;with call bell/phone within reach Nurse Communication: Mobility status;Precautions PT Visit Diagnosis: Unsteadiness on feet (R26.81);Other abnormalities of gait and mobility (R26.89);Muscle weakness (generalized) (M62.81);Difficulty in walking, not elsewhere classified (R26.2)     Time: 0737-1062 PT Time Calculation (min) (ACUTE ONLY): 26 min  Charges:  $Therapeutic Activity: 23-37 mins                     [place], PT Acute Rehabilitation Services Pager: [phone] Office: [place] 04/10/2019, 1:53 PM

## 2019-04-10 NOTE — Progress Notes (Signed)
Inpatient Rehab Admissions Coordinator:   I have insurance authorization and MD approval from Dr. Erlinda Hong for admission to inpatient rehab today.  I will let pt/family, RN, and CM/CSW know.   Shann Medal, PT, DPT Admissions Coordinator [phone] 04/10/19  12:23 PM

## 2019-04-10 NOTE — Progress Notes (Signed)
Meredith Staggers, MD  Physician  Physical Medicine and Rehabilitation  Consult Note  Signed  Date of Service:  04/08/2019 5:10 PM       Related encounter: ED to Hosp-Admission (Current) from 03/30/2019 in [place] [facility]      Signed      Expand All Collapse All    Show:Clear all [x] Manual[x] Template[] Copied  Added by: [x] Love, Ivan Anchors, PA-C[x] Meredith Staggers, MD  [] Hover for details      Physical Medicine and Rehabilitation Consult   Reason for Consult:Debility.  Referring Physician: Dr. Erlinda Hong   HPI: Joshua Whitaker is a 78 y.o. male with history of COPD, CAD, EtOH/tobacco abuse, chronic hyponatremia, recent admission 3/20-3/23 for AECOPD with severe hyponateremia and was discharged home on steroid taper.  He was readmitted to [facility] on 03/30/19 with 2-day history of cough and shortness of breath.  He developed respiratory failure requiring intubation on 4/7 and was transferred to [facility] to rule out COVID-19.  Acute on chronic respiratory failure with hypoxia felt to be secondary to COPD exacerbation and HCAP with likely aspiration component as COVID-19 test negative.  Hospital course significant for   A flutter with elevated troponin and was started on IV Cardizem for rate control.  Recent echo with EF 50-55% with small to moderate pericardial effusion and work up deferred as hemodynamically stable. He was started on treatment dose Lovenox with plans to transition to Eliquis. C  Cardiology recommends repeat echo in 1 month and 14 day Zio monitor to monitor for arrhthymias.  He tolerated extubation on 4/11 and respiratory status has been stable--continues on IV Unasyn. GOC discussed with family due to reports of declining at home and has elected on full scope of treatment.  MBS 4/15 revealing mild oropharyngeal dysphagia with silent aspiration of thins therefore placed on dysphagia 1, nectar liquids.  Therapy evaluations completed and patient noted to be  severely deconditioned. CIR recommended due to functional decline.    Review of Systems  Unable to perform ROS: Mental acuity  Constitutional: Negative for chills and fever.  HENT: Negative for hearing loss.   Respiratory: Positive for cough and shortness of breath.   Cardiovascular: Negative for chest pain and orthopnea.  Gastrointestinal: Negative for heartburn.       Per records--has been incontinent of B/B since last discharge.   Genitourinary: Negative for dysuria.  Musculoskeletal: Negative for myalgias.  Skin: Negative for rash.  Neurological: Positive for weakness. Negative for dizziness and headaches.  Psychiatric/Behavioral: Positive for memory loss.         Past Medical History:  Diagnosis Date   Alcohol abuse    Allergy    Anxiety    Asthma    COPD (chronic obstructive pulmonary disease) (HCC)    Coronary artery calcification seen on CT scan    Depression    Hyponatremia    Prostate cancer [facility])          Past Surgical History:  Procedure Laterality Date   EYE SURGERY     PROSTATE SURGERY     SPINE SURGERY           Family History  Problem Relation Age of Onset   Heart disease Mother    Arthritis Mother    Heart disease Father    Cancer Sister        kidney   Cancer Brother        lung   Cancer Brother        lung  Stroke Brother    Cancer Brother        prostate   Cancer Sister        colon   Cancer Sister        bone   Diabetes Sister    Chromosomal disorder Sister    Alcohol abuse Daughter    Rheum arthritis Daughter    Rheum arthritis Daughter     Social History:  Married. Was requiring assistance with mobility PTA per reports. He reports that he has been smoking cigarettes. He has been smoking about 1.00 pack per day--cut down from 1.5 PPD?  He has never used smokeless tobacco. Per reports current alcohol use. Per reports he does not use drugs.          Allergies  Allergen Reactions   Tiotropium Bromide Monohydrate Other (See Comments)    Severe urinary retention   Varenicline Other (See Comments)    Psychiatric and dreams   Iodinated Diagnostic Agents    Statins          Medications Prior to Admission  Medication Sig Dispense Refill   aspirin 325 MG tablet Take 650 mg by mouth daily as needed for mild pain.      atorvastatin (LIPITOR) 80 MG tablet Take 1 tablet (80 mg total) by mouth daily at 6 PM. 90 tablet 1   clopidogrel (PLAVIX) 75 MG tablet Take 1 tablet (75 mg total) by mouth daily. 30 tablet 0   escitalopram (LEXAPRO) 5 MG tablet Take 5 mg by mouth every morning.     fluticasone (FLONASE) 50 MCG/ACT nasal spray Place 1 spray into both nostrils daily. 16 g 5   Fluticasone-Umeclidin-Vilant (TRELEGY ELLIPTA) 100-62.5-25 MCG/INH AEPB Inhale 1 puff into the lungs daily. 28 each 5   folic acid (FOLVITE) 1 MG tablet Take 1 tablet (1 mg total) by mouth daily. 30 tablet 0   Ipratropium-Albuterol (COMBIVENT RESPIMAT) 20-100 MCG/ACT AERS respimat Inhale 2 puffs into the lungs every 6 (six) hours. 1 Inhaler 5   megestrol (MEGACE) 400 MG/10ML suspension Take 10 mLs (400 mg total) by mouth 2 (two) times daily. For appetite stimulation 600 mL 2   predniSONE (DELTASONE) 10 MG tablet Take 5 daily for 3 days followed by 4,3,2 and 1 for 3 days each. 45 tablet 0   PROAIR HFA 108 (90 Base) MCG/ACT inhaler Inhale 1-2 puffs into the lungs every 6 (six) hours as needed for wheezing or shortness of breath. (Patient taking differently: Inhale 1-2 puffs into the lungs every 6 (six) hours as needed for wheezing or shortness of breath. ) 8.5 g 2   thiamine 100 MG tablet Take 1 tablet (100 mg total) by mouth daily. 30 tablet 0    Home: Home Living Family/patient expects to be discharged to:: Private residence Living Arrangements: Spouse/significant other Available Help at Discharge: Family Type of Home: House Home Access:  Level entry Home Layout: Multi-level, Able to live on main level with bedroom/bathroom Alternate Level Stairs-Number of Steps: flight Bathroom Shower/Tub: Chiropodist: Standard Home Equipment: Cane - single point, Bedside commode Additional Comments: Daughter lives about 25 min away, has 2 grown sons who can help as well. Pt and his wife live in an old farmhouse that is heated by a wood stove.  Functional History: Prior Function Level of Independence: Needs assistance Gait / Transfers Assistance Needed: ambulated with RW ADL's / Homemaking Assistance Needed: assist from wife for bathing and dressing  Comments: Daughter's biggest concern is getting Dad to  eat so that he can get better -improve energy and strength Functional Status:  Mobility: Bed Mobility Overal bed mobility: Needs Assistance Bed Mobility: Supine to Sit, Sit to Supine Supine to sit: Mod assist, Max assist Sit to supine: Mod assist General bed mobility comments: Pt able to initiate supine to sit with core activation but quickly reaches out for assist to come to fully upright, requires increased time and effort and maximal cuing to balance EoB and is unable to maintain without pulling on RW in front of him  Transfers Overall transfer level: Needs assistance Equipment used: Rolling walker (2 wheeled) Transfers: Sit to/from Stand Sit to Stand: Max assist, From elevated surface, Mod assist, +2 physical assistance General transfer comment: maxA with assist of pad under bottom, to power up from elevated surface, with standing pt unable to balance without posterior support on LE, with rest after standing pt able to sit>stand 3 more times with modA and use of momentum  Ambulation/Gait Ambulation/Gait assistance: Mod assist, +2 physical assistance Gait Distance (Feet): 3 Feet Assistive device: Rolling walker (2 wheeled) Gait Pattern/deviations: Step-to pattern, Shuffle, Trunk flexed General Gait Details:  modAx2 for steadying, pt only able to take lateral steps towards HoB with continued support posteriorly on LE from bed Gait velocity: slowed Gait velocity interpretation: <1.31 ft/sec, indicative of household ambulator  ADL: ADL Overall ADL's : Needs assistance/impaired Eating/Feeding: Maximal assistance Grooming: Moderate assistance, Sitting Grooming Details (indicate cue type and reason): requires supported sitting, or BUE support for grooming tasks Upper Body Bathing: Moderate assistance Lower Body Bathing: Maximal assistance Upper Body Dressing : Moderate assistance Lower Body Dressing: Maximal assistance Lower Body Dressing Details (indicate cue type and reason): Pt unable to take off socks at this time Toilet Transfer: Maximal assistance, +2 for physical assistance, +2 for safety/equipment, BSC, RW Toilet Transfer Details (indicate cue type and reason): when Pt does not have support behind his legs he has zero balance Toileting- Clothing Manipulation and Hygiene: Maximal assistance Tub/ Shower Transfer: Maximal assistance, +2 for physical assistance, +2 for safety/equipment Functional mobility during ADLs: Maximal assistance, +2 for physical assistance, +2 for safety/equipment, Rolling walker, Cueing for safety, Cueing for sequencing General ADL Comments: Pt demonstrates deficits in cognition, balance, also demonstrates generalized weakness and deconditioning impacting ability to perform ADL  Cognition: Cognition Overall Cognitive Status: Impaired/Different from baseline Orientation Level: Oriented to person, Oriented to situation, Disoriented to time, Disoriented to place Cognition Arousal/Alertness: Awake/alert Behavior During Therapy: Flat affect Overall Cognitive Status: Impaired/Different from baseline Area of Impairment: Memory, Following commands, Safety/judgement, Awareness, Problem solving Memory: Decreased short-term memory Following Commands: Follows multi-step  commands inconsistently, Follows multi-step commands with increased time Safety/Judgement: Decreased awareness of deficits Awareness: Intellectual Problem Solving: Slow processing, Decreased initiation, Difficulty sequencing, Requires verbal cues, Requires tactile cues General Comments: Pt seems to have improved cognition from previous sessions with SLP and other medical staff, but remains confused, able to follow simple commands with increased time.    Blood pressure (!) 142/75, pulse 76, temperature 98.8 F (37.1 C), temperature source Oral, resp. rate 18, height 5\' 10"  (1.778 m), weight 58.1 kg, SpO2 95 %. Physical Exam  Nursing note and vitals reviewed. Constitutional: He appears well-developed. No distress.  Thin, ill appearing male, reporting malaise and SOB. Pulse ox 95% on RA.   HENT:  Head: Normocephalic.  Eyes: Pupils are equal, round, and reactive to light.  Neck: Normal range of motion.  Cardiovascular: Normal rate.  Respiratory: No accessory muscle usage. No respiratory distress. He  has rales.  Sl dyspneic with conversation  GI: Soft.  Musculoskeletal:        General: No edema.  Neurological: He is alert. No cranial nerve deficit.  Oriented to self. Disoriented--he was unable to recall reason for admit and thought that he was at [facility]. Follows basic commands. Normal language. UE 4/5 prox to distal. LE: 3+ HF, KE and 4/5 ADF/PF. No gross sensory findings  Skin: Skin is warm. He is not diaphoretic.  Psychiatric:  Flat but cooperative    LabResultsLast24Hours       Results for orders placed or performed during the hospital encounter of 03/30/19 (from the past 24 hour(s))  Glucose, capillary     Status: None   Collection Time: 04/07/19  8:20 PM  Result Value Ref Range   Glucose-Capillary 95 70 - 99 mg/dL  CBC     Status: Abnormal   Collection Time: 04/08/19  6:00 AM  Result Value Ref Range   WBC 10.3 4.0 - 10.5 K/uL   RBC 4.46 4.22 - 5.81 MIL/uL    Hemoglobin 13.3 13.0 - 17.0 g/dL   HCT 38.4 (L) 39.0 - 52.0 %   MCV 86.1 80.0 - 100.0 fL   MCH 29.8 26.0 - 34.0 pg   MCHC 34.6 30.0 - 36.0 g/dL   RDW 13.2 11.5 - 15.5 %   Platelets 239 150 - 400 K/uL   nRBC 0.0 0.0 - 0.2 %  Comprehensive metabolic panel     Status: Abnormal   Collection Time: 04/08/19  6:00 AM  Result Value Ref Range   Sodium 136 135 - 145 mmol/L   Potassium 2.6 (LL) 3.5 - 5.1 mmol/L   Chloride 99 98 - 111 mmol/L   CO2 26 22 - 32 mmol/L   Glucose, Bld 84 70 - 99 mg/dL   BUN 11 8 - 23 mg/dL   Creatinine, Ser 0.54 (L) 0.61 - 1.24 mg/dL   Calcium 7.9 (L) 8.9 - 10.3 mg/dL   Total Protein 5.1 (L) 6.5 - 8.1 g/dL   Albumin 2.4 (L) 3.5 - 5.0 g/dL   AST 73 (H) 15 - 41 U/L   ALT 274 (H) 0 - 44 U/L   Alkaline Phosphatase 63 38 - 126 U/L   Total Bilirubin 0.7 0.3 - 1.2 mg/dL   GFR calc non Af Amer >60 >60 mL/min   GFR calc Af Amer >60 >60 mL/min   Anion gap 11 5 - 15  Phosphorus     Status: Abnormal   Collection Time: 04/08/19  6:00 AM  Result Value Ref Range   Phosphorus 2.0 (L) 2.5 - 4.6 mg/dL  Glucose, capillary     Status: Abnormal   Collection Time: 04/08/19 12:17 PM  Result Value Ref Range   Glucose-Capillary 139 (H) 70 - 99 mg/dL      ImagingResults(Last48hours)  Dg Swallowing Func-speech Pathology  Result Date: 04/07/2019 Objective Swallowing Evaluation: Type of Study: MBS-Modified Barium Swallow Study  Patient Details Name: TILMAN MCCLAREN MRN: [id number] Date of Birth: 01/08/1941 Today's Date: 04/07/2019 Time: SLP Start Time (ACUTE ONLY): 1200 -SLP Stop Time (ACUTE ONLY): 1217 SLP Time Calculation (min) (ACUTE ONLY): 17 min Past Medical History: Past Medical History: Diagnosis Date  Alcohol abuse   Allergy   Anxiety   Asthma   COPD (chronic obstructive pulmonary disease) (HCC)   Coronary artery calcification seen on CT scan   Depression   Hyponatremia   Prostate cancer [facility])  Past Surgical History: Past Surgical  History: Procedure Laterality  Date  EYE SURGERY    PROSTATE SURGERY    SPINE SURGERY   HPI: 78 y.o. male with prior history of COPD, CAD, ETOH/ tobacco abuse, HTN, HLD, chronic hyponatremia presented to [facility] on 4/6 with 2 day history of shortness of breath and cough. Recent hospitalization for acute exacerbation of COPD and severe hyponatremia (3/20-3/23/20). Desaturation requiring BiPAP and was ultimately intubated 4/8-4/11/20. CXR showed L greater than R patchy densities. COVID-19 pending.  Subjective: pt alert, quiet, but appropraite Assessment / Plan / Recommendation CHL IP CLINICAL IMPRESSIONS 04/07/2019 Clinical Impression Pt has a mild pharyngeal dysphagia with sensory and motor deficits that could be explained by recent intubation and generalized deconditioning. His oral phase is functional considering limited dentition, with mildly prolonged, anterior mastication noted as this is where the majority of his teeth are. He has reduced hyolaryngeal movement, epiglottic deflection, and base of tongue retraction, with moderate vallecular residue post-swallow that is most prominent with more solid textures. This also results in penetration during the swallow with thin and nectar thick liquids, but with nectar thick liquid penetration transient in nature. Silent aspiration occurs on larger volumes of thin liquids. Coughing occurred frequently during testing but was not in relation to airway compromise on any barium, although he did seem to have thick secretions. Given the above as well as importance of conserving energy, recommend starting with Dys 1 diet and nectar thick liquids with good prognosis for advancement with additional time post-extubation and increase in strength of cough/overall cough.  SLP Visit Diagnosis Dysphagia, pharyngeal phase (R13.13) Attention and concentration deficit following -- Frontal lobe and executive function deficit following -- Impact on safety and function Moderate aspiration  risk;Mild aspiration risk   CHL IP TREATMENT RECOMMENDATION 04/07/2019 Treatment Recommendations Therapy as outlined in treatment plan below;F/U MBS in --- days (Comment)   Prognosis 04/07/2019 Prognosis for Safe Diet Advancement Good Barriers to Reach Goals -- Barriers/Prognosis Comment -- CHL IP DIET RECOMMENDATION 04/07/2019 SLP Diet Recommendations Dysphagia 1 (Puree) solids;Nectar thick liquid Liquid Administration via Cup;Straw Medication Administration Crushed with puree Compensations Slow rate;Small sips/bites Postural Changes Seated upright at 90 degrees;Remain semi-upright after after feeds/meals (Comment)   CHL IP OTHER RECOMMENDATIONS 04/07/2019 Recommended Consults -- Oral Care Recommendations Oral care BID Other Recommendations Order thickener from pharmacy;Prohibited food (jello, ice cream, thin soups);Remove water pitcher;Have oral suction available   CHL IP FOLLOW UP RECOMMENDATIONS 04/07/2019 Follow up Recommendations (No Data)   CHL IP FREQUENCY AND DURATION 04/07/2019 Speech Therapy Frequency (ACUTE ONLY) min 2x/week Treatment Duration 2 weeks      CHL IP ORAL PHASE 04/07/2019 Oral Phase WFL Oral - Pudding Teaspoon -- Oral - Pudding Cup -- Oral - Honey Teaspoon -- Oral - Honey Cup -- Oral - Nectar Teaspoon -- Oral - Nectar Cup -- Oral - Nectar Straw -- Oral - Thin Teaspoon -- Oral - Thin Cup -- Oral - Thin Straw -- Oral - Puree -- Oral - Mech Soft -- Oral - Regular -- Oral - Multi-Consistency -- Oral - Pill -- Oral Phase - Comment --  CHL IP PHARYNGEAL PHASE 04/07/2019 Pharyngeal Phase Impaired Pharyngeal- Pudding Teaspoon -- Pharyngeal -- Pharyngeal- Pudding Cup -- Pharyngeal -- Pharyngeal- Honey Teaspoon -- Pharyngeal -- Pharyngeal- Honey Cup -- Pharyngeal -- Pharyngeal- Nectar Teaspoon -- Pharyngeal -- Pharyngeal- Nectar Cup Reduced epiglottic inversion;Reduced anterior laryngeal mobility;Reduced laryngeal elevation;Reduced airway/laryngeal closure;Reduced tongue base retraction;Pharyngeal residue -  valleculae;Penetration/Aspiration during swallow Pharyngeal Material enters airway, remains ABOVE vocal cords then ejected out Pharyngeal- Nectar Straw Reduced epiglottic  inversion;Reduced anterior laryngeal mobility;Reduced laryngeal elevation;Reduced airway/laryngeal closure;Reduced tongue base retraction;Pharyngeal residue - valleculae;Penetration/Aspiration during swallow Pharyngeal Material enters airway, remains ABOVE vocal cords then ejected out Pharyngeal- Thin Teaspoon Reduced epiglottic inversion;Reduced anterior laryngeal mobility;Reduced laryngeal elevation;Reduced airway/laryngeal closure;Reduced tongue base retraction Pharyngeal -- Pharyngeal- Thin Cup Reduced epiglottic inversion;Reduced anterior laryngeal mobility;Reduced laryngeal elevation;Reduced airway/laryngeal closure;Reduced tongue base retraction;Penetration/Aspiration during swallow Pharyngeal Material enters airway, remains ABOVE vocal cords and not ejected out Pharyngeal- Thin Straw Reduced epiglottic inversion;Reduced anterior laryngeal mobility;Reduced laryngeal elevation;Reduced airway/laryngeal closure;Reduced tongue base retraction;Pharyngeal residue - valleculae;Penetration/Aspiration during swallow Pharyngeal Material enters airway, passes BELOW cords without attempt by patient to eject out (silent aspiration) Pharyngeal- Puree Reduced epiglottic inversion;Reduced anterior laryngeal mobility;Reduced laryngeal elevation;Reduced airway/laryngeal closure;Reduced tongue base retraction;Pharyngeal residue - valleculae Pharyngeal -- Pharyngeal- Mechanical Soft Reduced epiglottic inversion;Reduced anterior laryngeal mobility;Reduced laryngeal elevation;Reduced airway/laryngeal closure;Reduced tongue base retraction;Pharyngeal residue - valleculae Pharyngeal -- Pharyngeal- Regular -- Pharyngeal -- Pharyngeal- Multi-consistency -- Pharyngeal -- Pharyngeal- Pill -- Pharyngeal -- Pharyngeal Comment --  CHL IP CERVICAL ESOPHAGEAL PHASE  04/07/2019 Cervical Esophageal Phase Impaired Pudding Teaspoon -- Pudding Cup -- Honey Teaspoon -- Honey Cup -- Nectar Teaspoon -- Nectar Cup -- Nectar Straw -- Thin Teaspoon Esophageal backflow into the pharynx Thin Cup Esophageal backflow into the pharynx Thin Straw Esophageal backflow into the pharynx Puree -- Mechanical Soft -- Regular -- Multi-consistency -- Pill -- Cervical Esophageal Comment -- Venita Sheffield Nix 04/07/2019, 1:40 PM  Pollyann Glen, M.A. CCC-SLP Acute Rehabilitation Services Pager [phone] Office [phone]             US Abdomen Limited Ruq  Result Date: 04/06/2019 CLINICAL DATA:  78 y/o  M; transaminitis. EXAM: ULTRASOUND ABDOMEN LIMITED RIGHT UPPER QUADRANT COMPARISON:  04/05/2019 abdomen radiographs. 04/14/2015 CT abdomen and pelvis. FINDINGS: Gallbladder: Absent gallbladder, surgical clips in gallbladder fossa on prior radiographs and CT compatible with cholecystectomy. No fluid within the gallbladder fossa. Common bile duct: Diameter: 4.1 mm. Liver: No focal lesion identified. Within normal limits in parenchymal echogenicity. Portal vein is patent on color Doppler imaging with normal direction of blood flow towards the liver. Other: Incidental note of a linear 4 mm echogenic focus within the right interpolar kidney, probably related to vascular calcification within the bilateral renal hilum on prior CT. Right pleural effusion. IMPRESSION: 1. Right pleural effusion. 2. Cholecystectomy. Otherwise unremarkable right upper quadrant ultrasound. Electronically Signed   By: Kristine Garbe M.D.   On: 04/06/2019 21:04      Assessment/Plan: Diagnosis: debility and encephalopathy related to acute on chronic respiratory failure as well as multiple other medical complications 1. Does the need for close, 24 hr/day medical supervision in concert with the patient's rehab needs make it unreasonable for this patient to be served in a less intensive setting? Yes 2. Co-Morbidities  requiring supervision/potential complications: COPD, CAD, atrial flutter, HCAP 3. Due to bladder management, bowel management, safety, skin/wound care, disease management, medication administration, pain management and patient education, does the patient require 24 hr/day rehab nursing? Yes 4. Does the patient require coordinated care of a physician, rehab nurse, PT (1-2 hrs/day, 5 days/week), OT (1-2 hrs/day, 5 days/week) and SLP (1-2 hrs/day, 5 days/week) to address physical and functional deficits in the context of the above medical diagnosis(es)? Yes Addressing deficits in the following areas: balance, endurance, locomotion, strength, transferring, bowel/bladder control, bathing, dressing, feeding, grooming, toileting, cognition and psychosocial support 5. Can the patient actively participate in an intensive therapy program of at least 3 hrs of therapy per day at least  5 days per week? Yes 6. The potential for patient to make measurable gains while on inpatient rehab is excellent 7. Anticipated functional outcomes upon discharge from inpatient rehab are modified independent and supervision  with PT, modified independent and supervision with OT, modified independent and supervision with SLP. 8. Estimated rehab length of stay to reach the above functional goals is: 11-16 days 9. Anticipated D/C setting: Home 10. Anticipated post D/C treatments: [facility] therapy 11. Overall Rehab/Functional Prognosis: excellent  RECOMMENDATIONS: This patient's condition is appropriate for continued rehabilitative care in the following setting: CIR Patient has agreed to participate in recommended program. Yes Note that insurance prior authorization may be required for reimbursement for recommended care.  Comment: Rehab Admissions Coordinator to follow up.  Thanks,  Meredith Staggers, MD, Mellody Drown  I have personally performed a face to face diagnostic evaluation of this patient. Additionally, I have examined  pertinent labs and radiographic images. I have reviewed and concur with the physician assistant's documentation above.    Bary Leriche, PA-C 04/08/2019        Revision History                             Routing History

## 2019-04-10 NOTE — Progress Notes (Signed)
Occupational Therapy Treatment Patient Details Name: Joshua Whitaker MRN: [id number] DOB: 1941/08/20 Today's Date: 04/10/2019    History of present illness 78 y.o. male, with history of COPD, alcohol abuse, tobacco abuse, hypertension, hyponatremia was brought to the ED for shortness of breath. Patient was recently admitted to the hospital for concern for MI at that time he did not have cardiac catheterization. In ED patient was found to be in new onset atrial flutter, chest x-ray showed possibility of pneumonia. Developed respiratory failure intubated 4/7-4/11   OT comments  Pt bed mobility with minA and use of rails. Pt Sitting EOB with fair balance for grooming task and taxing scooting up towrads HOB in sitting; pt scooting self to [facility] in supine using rails and trendelenberg position with vrbal cues for proper technique. Pt O2 sats bordering 88% on 2L O2 >90% after 2 mins of recovery and pursed lip breathing and HR 85 BPM with activity. Pt would greatly benefit from continued OT skilled services for ADL, mobility and safety in CIR setting. OT to follow acutely.    Follow Up Recommendations  CIR;Supervision/Assistance - 24 hour    Equipment Recommendations  Tub/shower bench    Recommendations for Other Services      Precautions / Restrictions Precautions Precautions: Fall Precaution Comments: watch sats Restrictions Weight Bearing Restrictions: No       Mobility Bed Mobility Overal bed mobility: Needs Assistance Bed Mobility: Supine to Sit     Supine to sit: HOB elevated;Min guard Sit to supine: Min assist;HOB elevated   General bed mobility comments: pt able to pivot from supine to sitting with use of rail and HOB 20 degrees without physical assist and increased time  Transfers Overall transfer level: Needs assistance   Transfers: Sit to/from Stand Sit to Stand: Min guard         General transfer comment: pt able to stand from bed to stedy, from stedy, from [facility]  to stedy, from recliner all with only cues. Pt transferred from bed to [facility] with use of stedy and back to chair then walked    Balance Overall balance assessment: Needs assistance Sitting-balance support: Feet supported;Single extremity supported Sitting balance-Leahy Scale: Fair     Standing balance support: During functional activity;Bilateral upper extremity supported Standing balance-Leahy Scale: Poor Standing balance comment: B UE support for standing                           ADL either performed or assessed with clinical judgement   ADL Overall ADL's : Needs assistance/impaired     Grooming: Moderate assistance;Sitting                                       Vision       Perception     Praxis      Cognition Arousal/Alertness: Awake/alert Behavior During Therapy: Flat affect Overall Cognitive Status: Impaired/Different from baseline Area of Impairment: Memory;Following commands;Safety/judgement;Awareness;Problem solving                     Memory: Decreased short-term memory Following Commands: Follows one step commands consistently;Follows multi-step commands inconsistently Safety/Judgement: Decreased awareness of deficits   Problem Solving: Slow processing;Decreased initiation;Difficulty sequencing;Requires verbal cues;Requires tactile cues General Comments: Pt stating "I am at [facility]." instead of [facility]        Exercises  Shoulder Instructions       General Comments Sitting EOB for grooming task and taxing scooting up towrads HOB in sitting; pt scooting self to [facility] in supine using rails and trendelenberg position with vrbal cues for proper technique. Pt O2 sats bordering 88% on 2L O2 and HR 85 BPM with activity.    Pertinent Vitals/ Pain       Pain Assessment: No/denies pain  Home Living                                          Prior Functioning/Environment              Frequency  Min  3X/week        Progress Toward Goals  OT Goals(current goals can now be found in the care plan section)  Progress towards OT goals: Progressing toward goals  Acute Rehab OT Goals Patient Stated Goal: to go home OT Goal Formulation: With patient Time For Goal Achievement: 04/22/19 Potential to Achieve Goals: Good ADL Goals Pt Will Perform Grooming: with set-up;sitting Pt Will Perform Upper Body Dressing: with supervision;sitting Pt Will Perform Lower Body Dressing: with min guard assist;sit to/from stand Pt Will Transfer to Toilet: with min guard assist;stand pivot transfer;bedside commode Pt Will Perform Toileting - Clothing Manipulation and hygiene: with min guard assist;sit to/from stand Additional ADL Goal #1: Pt will perform bed mobility at supervision level prior to engaging in ADL  Plan Discharge plan remains appropriate    Co-evaluation                 AM-PAC OT "6 Clicks" Daily Activity     Outcome Measure   Help from another person eating meals?: A Lot Help from another person taking care of personal grooming?: A Lot Help from another person toileting, which includes using toliet, bedpan, or urinal?: A Lot Help from another person bathing (including washing, rinsing, drying)?: A Lot Help from another person to put on and taking off regular upper body clothing?: A Lot Help from another person to put on and taking off regular lower body clothing?: A Lot 6 Click Score: 12    End of Session Equipment Utilized During Treatment: Gait belt;Oxygen  OT Visit Diagnosis: Unsteadiness on feet (R26.81);Other abnormalities of gait and mobility (R26.89);Muscle weakness (generalized) (M62.81);Other symptoms and signs involving cognitive function;Adult, failure to thrive (R62.7)   Activity Tolerance Patient tolerated treatment well   Patient Left in bed;with call bell/phone within reach;with bed alarm set   Nurse Communication Mobility status        Time:  1600-1620 OT Time Calculation (min): 20 min  Charges: OT General Charges $OT Visit: 1 Visit OT Treatments $Neuromuscular Re-education: 8-22 mins  Joshua Whitaker) Marsa Aris OTR/L Acute Rehabilitation Services Pager: [phone] Office: [phone]    Joshua Whitaker 04/10/2019, 4:24 PM

## 2019-04-10 NOTE — Progress Notes (Signed)
ANTICOAGULATION CONSULT NOTE - Follow Up Consult  Pharmacy Consult for Lovenox Indication: New afib  Allergies  Allergen Reactions  . Tiotropium Bromide Monohydrate Other (See Comments)    Severe urinary retention  . Varenicline Other (See Comments)    Psychiatric and dreams  . Iodinated Diagnostic Agents   . Statins     Patient Measurements: Height: 5\' 10"  (177.8 cm) Weight: 115 lb 11.9 oz (52.5 kg) IBW/kg (Calculated) : 73   Vital Signs: Temp: 97.8 F (36.6 C) (04/17 1141) Temp Source: Oral (04/17 1141) BP: 110/55 (04/17 1141) Pulse Rate: 79 (04/17 1141)  Labs: Recent Labs    04/08/19 0600 04/09/19 0443 04/10/19 0504  HGB 13.3 13.4  --   HCT 38.4* 40.2  --   PLT 239 254  --   CREATININE 0.54* 0.58* 0.58*    Estimated Creatinine Clearance: 59.2 mL/min (A) (by C-G formula based on SCr of 0.58 mg/dL (L)).  Assessment: 78 yo male with new onset afib and started on apixaban 5mg  at [facility] (age <80, wt >60, Scr 0.96); CHADSVAS 3. He was changed to Lovenox until can take PO apixaban. Pharmacu consulted to restart apixaban -Last dose of lovenox was ~ 10am today (given as 1.5mg /kg/day)   Goal of Therapy:  Monitor platelets by anticoagulation protocol: Yes   Plan:  D/c lovenox Begin apiaban 5mg  po bid on 4/18 in am  Hildred Laser, PharmD Clinical Pharmacist **Pharmacist phone directory can now be found on [email or web address] ([email or web address]).  Listed under [place].

## 2019-04-10 NOTE — Discharge Summary (Addendum)
Discharge Summary  Joshua Whitaker NGE:[id number] DOB: November 19, 1941  PCP: [name], MD  Admit date: 03/30/2019 Discharge date: 04/10/2019  Time spent: 50mns, more than 50% time spent on coordination of care. Patient is discharged to inpatient rehab  Recommendations for Outpatient Follow-up after discharge from rehab:  1. F/u with PCP within a week  for hospital discharge follow up, repeat cbc/bmp at follow up 2. F/u with cardiology for outpatient event monitor for afib and anticoagulation (apixaban) duration  Discharge Diagnoses:  Active Hospital Problems   Diagnosis Date Noted   Acute on chronic respiratory failure with hypoxia ([facility] 03/16/2019   Hypokalemia    Weakness generalized    Adult failure to thrive    Pericardial effusion 04/02/2019   Palliative care by specialist    DNR (do not resuscitate) discussion    Lobar pneumonia ([facility] 04/01/2019   Acute on chronic respiratory failure ([facility] 04/01/2019   Atrial flutter ([facility] 03/30/2019   Chronic obstructive pulmonary disease ([place]    Transaminitis     Resolved Hospital Problems  No resolved problems to display.    Discharge Condition: stable  Diet recommendation: heart healthy, dysphagia I diet , nectar thick liquid Aspiration precaution  Filed Weights   04/07/19 0400 04/08/19 0621 04/10/19 0543  Weight: 58.2 kg 58.1 kg 52.5 kg    History of present illness: (per ICU admitting note by Dr SAlva Garnet  89[facility]  Admitted 4/6 with 2 day hx of cough and SOB.  Recent admit for AECOPD.  Developed respiratory failure 4/7 requiring intubation.  COVID-19 r/o.  Hospitalization complicated by atrial flutter with RVR and elevated troponin's which were flat in which cardiology has been consulted.    Significant events: 3/20 to 3/23 for AECOPD and severe hyponatremia 4[facility]6 [facility], 4/11 extubated  4/13 transferred to hospitalist service   Hospital Course:    Principal Problem:   Acute on chronic respiratory failure with hypoxia ([place] Active Problems:   Chronic obstructive pulmonary disease (HCC)   Transaminitis   Atrial flutter (HCC)   Lobar pneumonia (HCC)   Acute on chronic respiratory failure (HCC)   Pericardial effusion   Palliative care by specialist   DNR (do not resuscitate) discussion   Adult failure to thrive   Weakness generalized   Hypokalemia  Acute on chronic respiratory failure with hypoxiasecondary to COPD exacerbation, HCAP with component of aspiration pneumonia/small right pleural effusion -he was extubated and   Transferred from icu to hospitalist team on 4/13 -he was on vanc/cefepime initially  then unasyn, last dose of abx on 4/16 -ng removed, he is started on dysphagia diet one, nectar thick liquid per speech recommendation  Diarrhea, no fever, no leukocytosis, no ab pain, from ensure? From abx? Hold ensure, he is refusing it anyway Start probiotics, seems has resolved  Hypokalemia/hypomagnesemia/Hypophosphatemia replaced and normalized  Acute exacerbation ofCOPD, home o2 4liters at baseline:he is off steroids,supplemental o2and bronchodilators as above. No wheezing, no cough  New onsetAtrial flutterw RMWN:UUVOZnow. - patient on therapeutic lovenox initially due to npo status, he is discharged on apixaban  Per cardiology recommendation   Stop aspirin whenapixaban started and contcontinue Plavix. Follow-up with cardiology for 14-day event monitoring as outpatient.May not be anideal candidate for long-term anticoagulation due to his alcohol use.  CAD:  -He has ST elevation during hospitalization from 3/20-3/23 -Cardiology was consulted during admission as his initial EKG showed ST elevation along the inferior leads but this was reviewed with  the Interventional Team (Dr. Ellyn Hack) and an urgent cath was not pursued as he was close to needing intubation at the time of arrival and the patient declined  and made himself DNR/DNI (changed later during admission as he was in agreement for CPR but declined intubation). Troponin values remained flat that admission, peaking at 0.06. He was initially treated with Heparin but this was transitioned to Plavix'75mg'$  dailygiven the concern for underlying 3-vessel CAD.  Statin on hold due to elevated lft, consider resume statin once lft improves  Pericardial Effusion - small to moderate by recent echocardiogram on March 21 and without evidence of tamponade at that time. This will ultimately need to be re-imaged to ensure stability/improvement. -f/u with cardiology  Per cardiology sign off note by  Dr Sallyanne Kuster on 4/11 "Medication Recommendations:   - Restart apixaban when taking PO and continue for at least the next 6 weeks..  - Stop ASA when apixaban started. Continue clopidogrel. - However, reviewing his PCP notes, alcoholism is an issue and he may not be the best candidate for long term anticoagulation. Other recommendations (labs, testing, etc):   - Reevaluate with outpatient 14-day arrhythmia monitor after full recovery from current respiratory illness (no sooner than 30 days from now). If no recurrent atrial flutter/fibrillation, stop apixaban at that time. - limited echo for pericardial effusion in 1 month Follow up as an outpatient:   - Will arrange for OP event monitor followed by E-visit"   Transaminitis:tbili/alk phos unremarkable with elevated ast/alt Unclear etiology,  acute hepatitis panel was negative ultrasound abdomen s/p cholecystectomy He does has alcohol use history  ck unremarkable Continue hold statin   lft slowly improving  Oral thrush Patient has thick tongue coating, denies odynophagia started on nystatin solution  Alcohol use history, on thiamine, reports drink 1-2 beer daily  Smoking history, he declined nicotine patch  H/o prostate cancer, with h/o prostate surgery, unknown detail  FTT/ Severe  malnutrition in context of chronic illness, Underweight,  BMI18.4 Nutrition input appreciated Two hospitalizations in less than one months PT/OT/Speech recommend CIR, CIR consulted   Code Status: full  Family Communication: patient , daughter over the phone  Disposition Plan: inpatient rehab    Consultants:   Critical care  cardiology  Palliative care  CIR  Procedures:  Intubation/extubation  Ng tube placement and removal  MBS   Micro data: 4/6 MRSA PCR >> neg 4/8 RVP >> neg  4/8 COVID NAA >> negative  4/8 trach asp >> GPC,. GPRs  Antibiotic used in the hospital 4/6 levaquin x 1 4/7 vanc >> 4/8 4/7 cefepime >> 4/9 Ceftriaxone 4/9  Azithro 4/9  Then unasyn, last dose on 4/16    Discharge Exam: BP (!) 110/55 (BP Location: Left Arm)    Pulse 79    Temp 97.8 F (36.6 C) (Oral)    Resp 20    Ht '5\' 10"'$  (1.778 m)    Wt 52.5 kg    SpO2 96%    BMI 16.61 kg/m   General: Frail, NAD Cardiovascular: RRR Respiratory: diminished, no wheezing, no rhonchi, no rales Extremity: no edema  Discharge Instructions You were cared for by a hospitalist during your hospital stay. If you have any questions about your discharge medications or the care you received while you were in the hospital after you are discharged, you can call the unit and asked to speak with the hospitalist on call if the hospitalist that took care of you is not available. Once you are discharged, your  primary care physician will handle any further medical issues. Please note that NO REFILLS for any discharge medications will be authorized once you are discharged, as it is imperative that you return to your primary care physician (or establish a relationship with a primary care physician if you do not have one) for your aftercare needs so that they can reassess your need for medications and monitor your lab values.  Discharge Instructions    Diet - low sodium heart healthy   Complete by:  As  directed    Increase activity slowly   Complete by:  As directed      Allergies as of 04/10/2019      Reactions   Tiotropium Bromide Monohydrate Other (See Comments)   Severe urinary retention   Varenicline Other (See Comments)   Psychiatric and dreams   Iodinated Diagnostic Agents    Statins       Medication List    STOP taking these medications   aspirin 325 MG tablet   atorvastatin 80 MG tablet Commonly known as:  LIPITOR   predniSONE 10 MG tablet Commonly known as:  DELTASONE     TAKE these medications   apixaban 5 MG Tabs tablet Commonly known as:  ELIQUIS Take 1 tablet (5 mg total) by mouth 2 (two) times daily. Start taking on:  April 11, 2019   clopidogrel 75 MG tablet Commonly known as:  PLAVIX Take 1 tablet (75 mg total) by mouth daily.   escitalopram 5 MG tablet Commonly known as:  LEXAPRO Take 5 mg by mouth every morning.   fluticasone 50 MCG/ACT nasal spray Commonly known as:  FLONASE Place 1 spray into both nostrils daily.   Fluticasone-Umeclidin-Vilant 100-62.5-25 MCG/INH Aepb Commonly known as:  Trelegy Ellipta Inhale 1 puff into the lungs daily.   folic acid 1 MG tablet Commonly known as:  FOLVITE Take 1 tablet (1 mg total) by mouth daily.   Ipratropium-Albuterol 20-100 MCG/ACT Aers respimat Commonly known as:  Combivent Respimat Inhale 2 puffs into the lungs every 6 (six) hours.   megestrol 400 MG/10ML suspension Commonly known as:  MEGACE Take 10 mLs (400 mg total) by mouth 2 (two) times daily. For appetite stimulation   metoprolol tartrate 25 MG tablet Commonly known as:  LOPRESSOR Take 0.5 tablets (12.5 mg total) by mouth 2 (two) times daily.   multivitamin with minerals Tabs tablet Take 1 tablet by mouth daily. Start taking on:  April 11, 2019   nystatin 100000 UNIT/ML suspension Commonly known as:  MYCOSTATIN Take 5 mLs (500,000 Units total) by mouth 4 (four) times daily.   pantoprazole 40 MG tablet Commonly known as:   PROTONIX Take 1 tablet (40 mg total) by mouth at bedtime.   ProAir HFA 108 (90 Base) MCG/ACT inhaler Generic drug:  albuterol Inhale 1-2 puffs into the lungs every 6 (six) hours as needed for wheezing or shortness of breath. What changed:  See the new instructions.   Resource ThickenUp Clear Powd For nectar thick liquid   saccharomyces boulardii 250 MG capsule Commonly known as:  FLORASTOR Take 1 capsule (250 mg total) by mouth 2 (two) times daily.   thiamine 100 MG tablet Take 1 tablet (100 mg total) by mouth daily.      Allergies  Allergen Reactions   Tiotropium Bromide Monohydrate Other (See Comments)    Severe urinary retention   Varenicline Other (See Comments)    Psychiatric and dreams   Iodinated Diagnostic Agents    Statins  Follow-up Information    [facility] Follow [facility].   Specialty:  Home Health Services Why:  They will do your home healthcare at your home       [facility], [facility] Follow up.   Specialty:  Hospice Services Why:  They will do your Outpatient Palliative Care services Contact information: [place] [address] [phone]            The results of significant diagnostics from this hospitalization (including imaging, microbiology, ancillary and laboratory) are listed below for reference.    Significant Diagnostic Studies: Dg Chest 1 View  Result Date: 04/01/2019 CLINICAL DATA:  Endotracheal tube.  Gastric tube EXAM: CHEST  1 VIEW COMPARISON:  04/01/2019 FINDINGS: Endotracheal tube in good position.  NG tube in the stomach. COPD with apical emphysema bilaterally. Small bilateral effusions. Mild bibasilar airspace disease unchanged. IMPRESSION: Endotracheal tube in good position.  NG tube in the stomach Severe COPD with apical emphysema. No change in mild bibasilar airspace disease and small effusions. Electronically Signed   By: Franchot Gallo M.D.   On: 04/01/2019 18:28   Dg Chest 2  View  Result Date: 04/09/2019 CLINICAL DATA:  Respiratory failure.  Possible pneumonia. EXAM: CHEST - 2 VIEW COMPARISON:  04/02/2019 and 01/06/2019 FINDINGS: Patient slightly rotated to the right. Lungs are adequately inflated and demonstrate evidence of emphysematous disease with left upper lobe scarring. No focal lobar consolidation. Small amount of bilateral pleural fluid seen posteriorly on the lateral film. No pneumothorax. Cardiomediastinal silhouette is within normal. Remainder of the exam is unchanged. IMPRESSION: Small bilateral pleural effusions. Emphysematous disease. Electronically Signed   By: Marin Olp M.D.   On: 04/09/2019 09:08   Dg Chest 2 View  Result Date: 03/30/2019 CLINICAL DATA:  Shortness of breath which is recurrent but intermittent. EXAM: CHEST - 2 VIEW COMPARISON:  03/13/2019.  01/06/2019.  Chest CT 04/30/2018. FINDINGS: Cardiomegaly and aortic atherosclerosis as seen previously. Emphysema, upper lobe predominant. Pulmonary scarring. Focal density in the left upper lobe as seen previously, felt to represent scarring by CT. Increased patchy density in the mid and lower lungs not seen previously that could represent superimposed pneumonia. IMPRESSION: Background severe COPD. Newly seen patchy density in the mid and lower lungs left more than right that could represent overlying acute pneumonia. Electronically Signed   By: Nelson Chimes M.D.   On: 03/30/2019 17:50   Dg Abd 1 View  Result Date: 04/06/2019 CLINICAL DATA:  Check gastric catheter placement EXAM: ABDOMEN - 1 VIEW COMPARISON:  04/05/2019 FINDINGS: Gastric catheter as been further advanced into the stomach. Tip lies in the stomach although the proximal side port remains in the distal esophagus. This could be advanced several cm. Degenerative changes of the lumbar spine are noted. Diffuse vascular calcifications are noted. IMPRESSION: Gastric catheter within the stomach as described. This could be advanced several cm.  Electronically Signed   By: Inez Catalina M.D.   On: 04/06/2019 00:44   Dg Abd 1 View  Result Date: 04/05/2019 CLINICAL DATA:  78 y/o male NG tube placement. EXAM: ABDOMEN - 1 VIEW COMPARISON:  Plain film of the abdomen from earlier same day. FINDINGS: Enteric tube is now coiled within the lower esophagus. Visualized bowel gas pattern is nonobstructive. No evidence of free intraperitoneal air. Cholecystectomy clips in the RIGHT upper quadrant. IMPRESSION: Enteric tube now coiled within the lower esophagus. Recommend advancing at least 10 cm. These results will be called to the ordering clinician  or representative by the Radiologist Assistant, and communication documented in the PACS or zVision Dashboard. Electronically Signed   By: Franki Cabot M.D.   On: 04/05/2019 17:57   Ct Head Wo Contrast  Result Date: 03/31/2019 CLINICAL DATA:  Respiratory distress, altered mental status EXAM: CT HEAD WITHOUT CONTRAST TECHNIQUE: Contiguous axial images were obtained from the base of the skull through the vertex without intravenous contrast. COMPARISON:  None. FINDINGS: Brain: Diffuse brain atrophy and chronic white matter microvascular ischemic changes throughout both cerebral hemispheres. No acute intracranial hemorrhage mass lesion, infarction, midline shift, herniation, hydrocephalus, or extra-axial fluid collection. No focal mass effect or edema. Cisterns are patent. Cerebellar atrophy as well. Vascular: Intracranial atherosclerosis.  No hyperdense vessel. Skull: Normal. Negative for fracture or focal lesion. Sinuses/Orbits: Minor mucosal thickening throughout the paranasal sinuses. No orbital abnormality or acute finding. Mastoids are clear. Other: None. IMPRESSION: Brain atrophy and chronic white matter microvascular ischemic changes. No acute intracranial abnormality by noncontrast CT. Electronically Signed   By: Jerilynn Mages.  Shick M.D.   On: 03/31/2019 14:57   Dg Chest Port 1 View  Result Date: 04/02/2019 CLINICAL  DATA:  Acute respiratory failure EXAM: PORTABLE CHEST 1 VIEW COMPARISON:  Yesterday FINDINGS: Endotracheal tube tip between the clavicular heads and carina. The orogastric tube reaches the stomach. COPD with emphysema and interstitial coarsening. There may be trace pleural fluid. Mild hazy density at the left apex. No pneumothorax. IMPRESSION: 1. Stable hardware positioning. 2. COPD with mild lung opacity and trace pleural fluid. Electronically Signed   By: Monte Fantasia M.D.   On: 04/02/2019 08:08   Dg Chest Port 1 View  Result Date: 04/01/2019 CLINICAL DATA:  Respiratory distress.  Status post intubation today. EXAM: PORTABLE CHEST 1 VIEW COMPARISON:  CT chest 04/30/2018. Single-view of the chest 03/31/2019. PA and lateral chest 03/30/2019. FINDINGS: Endotracheal tube is in place with the tip in good position well above the carina. The patient also has a new NG tube. The tube is visualized into the lower chest but obscured distally due to underpenetration. The lungs are severely emphysematous. Bibasilar airspace disease is again seen. Aeration appears mildly improved in the bases, particularly on the right. No pneumothorax or pleural effusion. Cardiomegaly. Atherosclerosis. No acute or focal bony abnormality. IMPRESSION: ETT in good position. NG tube is visualized into the lower chest. The remainder of the NG tube is not visible due to underpenetration. Bibasilar airspace disease demonstrates some improvement on the right. Severe emphysema. Electronically Signed   By: Inge Rise M.D.   On: 04/01/2019 13:55   Dg Chest Port 1 View  Result Date: 03/31/2019 CLINICAL DATA:  Dyspnea EXAM: PORTABLE CHEST 1 VIEW COMPARISON:  03/30/2019 FINDINGS: Cardiac enlargement.  COPD. Interval development of right lower lobe airspace disease, probable pneumonia. Small right effusion. Scarring in the left upper lobe unchanged. No definite heart failure IMPRESSION: Interval development of right lower lobe infiltrate,  probable pneumonia. Small right effusion COPD Electronically Signed   By: Franchot Gallo M.D.   On: 03/31/2019 20:55   Dg Chest Port 1 View  Result Date: 03/13/2019 CLINICAL DATA:  Cough and chronic shortness of breath worse today, dyspnea EXAM: PORTABLE CHEST 1 VIEW COMPARISON:  01/06/2019 FINDINGS: Mild enlargement of cardiac silhouette. Atherosclerotic calcification aorta. Mediastinal contours and pulmonary vascularity normal. Emphysematous and mild bronchitic changes consistent with COPD. Scarring in LEFT upper lobe and minimally at LEFT base. No acute infiltrate, pleural effusion or pneumothorax. Bones demineralized. IMPRESSION: COPD changes without acute infiltrate. Mild enlargement  of cardiac silhouette. Electronically Signed   By: Lavonia Dana M.D.   On: 03/13/2019 23:17   Dg Abd Portable 1v  Result Date: 04/05/2019 CLINICAL DATA:  Bedside OG tube placement. EXAM: PORTABLE ABDOMEN - 1 VIEW COMPARISON:  None. FINDINGS: The OG tube is looped and possibly kinked in the proximal stomach with its tip back up into the distal esophagus. The tube should be withdrawn and readvanced. Visualized bowel gas pattern unremarkable. Aortoiliac atherosclerosis without evidence of aneurysm. IMPRESSION: 1. OG tube is looped and possibly kinked in the proximal stomach with its tip back up into the distal esophagus. The tube should be withdrawn and readvanced. 2. No acute abdominal abnormality. Electronically Signed   By: Evangeline Dakin M.D.   On: 04/05/2019 16:35   Dg Swallowing Func-speech Pathology  Result Date: 04/07/2019 Objective Swallowing Evaluation: Type of Study: MBS-Modified Barium Swallow Study  Patient Details Name: Joshua Whitaker MRN: [id number] Date of Birth: August 30, 1941 Today's Date: 04/07/2019 Time: SLP Start Time (ACUTE ONLY): 1200 -SLP Stop Time (ACUTE ONLY): 1217 SLP Time Calculation (min) (ACUTE ONLY): 17 min Past Medical History: Past Medical History: Diagnosis Date  Alcohol abuse   Allergy    Anxiety   Asthma   COPD (chronic obstructive pulmonary disease) (HCC)   Coronary artery calcification seen on CT scan   Depression   Hyponatremia   Prostate cancer [facility])  Past Surgical History: Past Surgical History: Procedure Laterality Date  EYE SURGERY    PROSTATE SURGERY    SPINE SURGERY   HPI: 78 y.o. male with prior history of COPD, CAD, ETOH/ tobacco abuse, HTN, HLD, chronic hyponatremia presented to [facility] on 4/6 with 2 day history of shortness of breath and cough. Recent hospitalization for acute exacerbation of COPD and severe hyponatremia (3/20-3/23/20). Desaturation requiring BiPAP and was ultimately intubated 4/8-4/11/20. CXR showed L greater than R patchy densities. COVID-19 pending.  Subjective: pt alert, quiet, but appropraite Assessment / Plan / Recommendation CHL IP CLINICAL IMPRESSIONS 04/07/2019 Clinical Impression Pt has a mild pharyngeal dysphagia with sensory and motor deficits that could be explained by recent intubation and generalized deconditioning. His oral phase is functional considering limited dentition, with mildly prolonged, anterior mastication noted as this is where the majority of his teeth are. He has reduced hyolaryngeal movement, epiglottic deflection, and base of tongue retraction, with moderate vallecular residue post-swallow that is most prominent with more solid textures. This also results in penetration during the swallow with thin and nectar thick liquids, but with nectar thick liquid penetration transient in nature. Silent aspiration occurs on larger volumes of thin liquids. Coughing occurred frequently during testing but was not in relation to airway compromise on any barium, although he did seem to have thick secretions. Given the above as well as importance of conserving energy, recommend starting with Dys 1 diet and nectar thick liquids with good prognosis for advancement with additional time post-extubation and increase in strength of cough/overall cough.  SLP  Visit Diagnosis Dysphagia, pharyngeal phase (R13.13) Attention and concentration deficit following -- Frontal lobe and executive function deficit following -- Impact on safety and function Moderate aspiration risk;Mild aspiration risk   CHL IP TREATMENT RECOMMENDATION 04/07/2019 Treatment Recommendations Therapy as outlined in treatment plan below;F/U MBS in --- days (Comment)   Prognosis 04/07/2019 Prognosis for Safe Diet Advancement Good Barriers to Reach Goals -- Barriers/Prognosis Comment -- CHL IP DIET RECOMMENDATION 04/07/2019 SLP Diet Recommendations Dysphagia 1 (Puree) solids;Nectar thick liquid Liquid Administration via Cup;Straw Medication Administration Crushed with puree Compensations  Slow rate;Small sips/bites Postural Changes Seated upright at 90 degrees;Remain semi-upright after after feeds/meals (Comment)   CHL IP OTHER RECOMMENDATIONS 04/07/2019 Recommended Consults -- Oral Care Recommendations Oral care BID Other Recommendations Order thickener from pharmacy;Prohibited food (jello, ice cream, thin soups);Remove water pitcher;Have oral suction available   CHL IP FOLLOW UP RECOMMENDATIONS 04/07/2019 Follow up Recommendations (No Data)   CHL IP FREQUENCY AND DURATION 04/07/2019 Speech Therapy Frequency (ACUTE ONLY) min 2x/week Treatment Duration 2 weeks      CHL IP ORAL PHASE 04/07/2019 Oral Phase WFL Oral - Pudding Teaspoon -- Oral - Pudding Cup -- Oral - Honey Teaspoon -- Oral - Honey Cup -- Oral - Nectar Teaspoon -- Oral - Nectar Cup -- Oral - Nectar Straw -- Oral - Thin Teaspoon -- Oral - Thin Cup -- Oral - Thin Straw -- Oral - Puree -- Oral - Mech Soft -- Oral - Regular -- Oral - Multi-Consistency -- Oral - Pill -- Oral Phase - Comment --  CHL IP PHARYNGEAL PHASE 04/07/2019 Pharyngeal Phase Impaired Pharyngeal- Pudding Teaspoon -- Pharyngeal -- Pharyngeal- Pudding Cup -- Pharyngeal -- Pharyngeal- Honey Teaspoon -- Pharyngeal -- Pharyngeal- Honey Cup -- Pharyngeal -- Pharyngeal- Nectar Teaspoon --  Pharyngeal -- Pharyngeal- Nectar Cup Reduced epiglottic inversion;Reduced anterior laryngeal mobility;Reduced laryngeal elevation;Reduced airway/laryngeal closure;Reduced tongue base retraction;Pharyngeal residue - valleculae;Penetration/Aspiration during swallow Pharyngeal Material enters airway, remains ABOVE vocal cords then ejected out Pharyngeal- Nectar Straw Reduced epiglottic inversion;Reduced anterior laryngeal mobility;Reduced laryngeal elevation;Reduced airway/laryngeal closure;Reduced tongue base retraction;Pharyngeal residue - valleculae;Penetration/Aspiration during swallow Pharyngeal Material enters airway, remains ABOVE vocal cords then ejected out Pharyngeal- Thin Teaspoon Reduced epiglottic inversion;Reduced anterior laryngeal mobility;Reduced laryngeal elevation;Reduced airway/laryngeal closure;Reduced tongue base retraction Pharyngeal -- Pharyngeal- Thin Cup Reduced epiglottic inversion;Reduced anterior laryngeal mobility;Reduced laryngeal elevation;Reduced airway/laryngeal closure;Reduced tongue base retraction;Penetration/Aspiration during swallow Pharyngeal Material enters airway, remains ABOVE vocal cords and not ejected out Pharyngeal- Thin Straw Reduced epiglottic inversion;Reduced anterior laryngeal mobility;Reduced laryngeal elevation;Reduced airway/laryngeal closure;Reduced tongue base retraction;Pharyngeal residue - valleculae;Penetration/Aspiration during swallow Pharyngeal Material enters airway, passes BELOW cords without attempt by patient to eject out (silent aspiration) Pharyngeal- Puree Reduced epiglottic inversion;Reduced anterior laryngeal mobility;Reduced laryngeal elevation;Reduced airway/laryngeal closure;Reduced tongue base retraction;Pharyngeal residue - valleculae Pharyngeal -- Pharyngeal- Mechanical Soft Reduced epiglottic inversion;Reduced anterior laryngeal mobility;Reduced laryngeal elevation;Reduced airway/laryngeal closure;Reduced tongue base retraction;Pharyngeal  residue - valleculae Pharyngeal -- Pharyngeal- Regular -- Pharyngeal -- Pharyngeal- Multi-consistency -- Pharyngeal -- Pharyngeal- Pill -- Pharyngeal -- Pharyngeal Comment --  CHL IP CERVICAL ESOPHAGEAL PHASE 04/07/2019 Cervical Esophageal Phase Impaired Pudding Teaspoon -- Pudding Cup -- Honey Teaspoon -- Honey Cup -- Nectar Teaspoon -- Nectar Cup -- Nectar Straw -- Thin Teaspoon Esophageal backflow into the pharynx Thin Cup Esophageal backflow into the pharynx Thin Straw Esophageal backflow into the pharynx Puree -- Mechanical Soft -- Regular -- Multi-consistency -- Pill -- Cervical Esophageal Comment -- Venita Sheffield Nix 04/07/2019, 1:40 PM  Pollyann Glen, M.A. CCC-SLP Acute Rehabilitation Services Pager [phone] Office [phone]             US Abdomen Limited Ruq  Result Date: 04/06/2019 CLINICAL DATA:  78 y/o  M; transaminitis. EXAM: ULTRASOUND ABDOMEN LIMITED RIGHT UPPER QUADRANT COMPARISON:  04/05/2019 abdomen radiographs. 04/14/2015 CT abdomen and pelvis. FINDINGS: Gallbladder: Absent gallbladder, surgical clips in gallbladder fossa on prior radiographs and CT compatible with cholecystectomy. No fluid within the gallbladder fossa. Common bile duct: Diameter: 4.1 mm. Liver: No focal lesion identified. Within normal limits in parenchymal echogenicity. Portal vein is patent on color Doppler imaging with normal direction  of blood flow towards the liver. Other: Incidental note of a linear 4 mm echogenic focus within the right interpolar kidney, probably related to vascular calcification within the bilateral renal hilum on prior CT. Right pleural effusion. IMPRESSION: 1. Right pleural effusion. 2. Cholecystectomy. Otherwise unremarkable right upper quadrant ultrasound. Electronically Signed   By: Kristine Garbe M.D.   On: 04/06/2019 21:04    Microbiology: Recent Results (from the past 240 hour(s))  Respiratory Panel by PCR     Status: None   Collection Time: 04/01/19 12:55 PM  Result Value Ref  Range Status   Adenovirus NOT DETECTED NOT DETECTED Final   Coronavirus 229E NOT DETECTED NOT DETECTED Final    Comment: (NOTE) The Coronavirus on the Respiratory Panel, DOES NOT test for the novel  Coronavirus (2019 nCoV)    Coronavirus HKU1 NOT DETECTED NOT DETECTED Final   Coronavirus NL63 NOT DETECTED NOT DETECTED Final   Coronavirus OC43 NOT DETECTED NOT DETECTED Final   Metapneumovirus NOT DETECTED NOT DETECTED Final   Rhinovirus / Enterovirus NOT DETECTED NOT DETECTED Final   Influenza A NOT DETECTED NOT DETECTED Final   Influenza B NOT DETECTED NOT DETECTED Final   Parainfluenza Virus 1 NOT DETECTED NOT DETECTED Final   Parainfluenza Virus 2 NOT DETECTED NOT DETECTED Final   Parainfluenza Virus 3 NOT DETECTED NOT DETECTED Final   Parainfluenza Virus 4 NOT DETECTED NOT DETECTED Final   Respiratory Syncytial Virus NOT DETECTED NOT DETECTED Final   Bordetella pertussis NOT DETECTED NOT DETECTED Final   Chlamydophila pneumoniae NOT DETECTED NOT DETECTED Final   Mycoplasma pneumoniae NOT DETECTED NOT DETECTED Final    Comment: Performed at [facility], [place]. [address], [place], [place] [address]  Novel Coronavirus, NAA (hospital order; send-out to ref lab)     Status: None   Collection Time: 04/01/19 12:56 PM  Result Value Ref Range Status   SARS-CoV-2, NAA NOT DETECTED NOT DETECTED Final    Comment: Negative (Not Detected) results do not exclude infection caused by SARS CoV 2 and should not be used as the sole basis for treatment or other patient management decisions. Optimum specimen types and timing for peak viral levels during infections caused  by SARS CoV 2 have not been determined. Collection of multiple specimens (types and time points) from the same patient may be necessary to detect the virus. Improper specimen collection and handling, sequence variability underlying assay primers and or probes, or the presence of organisms in  quantities less than the limit of  detection of the assay may lead to false negative results. Positive and negative predictive values of testing are highly dependent on prevalence. False negative results are more likely when prevalence of disease is high. (NOTE) The expected result is Negative (Not Detected). The SARS CoV 2 test is intended for the presumptive qualitative  detection of nucleic acid from SARS CoV 2 in upper and lower  respir atory specimens. Testing methodology is real time RT PCR. Test results must be correlated with clinical presentation and  evaluated in the context of other laboratory and epidemiologic data.  Test performance can be affected because the epidemiology and  clinical spectrum of infection caused by SARS CoV 2 is not fully  known. For example, the optimum types of specimens to collect and  when during the course of infection these specimens are most likely  to contain detectable viral RNA may not be known. This test has not been Food and Drug Administration (FDA) cleared or  approved and has been authorized by FDA under an Emergency Use  Authorization (EUA). The test is only authorized for the duration of  the declaration that circumstances exist justifying the authorization  of emergency use of in vitro diagnostic tests for detection and or  diagnosis of SARS CoV 2 under Section 564(b)(1) of the Act, 21 U.S.C.  section 2311134871 3(b)(1), unless the authorization is terminated or   revoked sooner. [facility] is certified under the  Clinical Laboratory Improvement Amendments of 1988 (CLIA), 42 U.S.C.  section [phone], to perform high complexity tests. Performed at [id number] [address], [place], [place] [address] Laboratory Director: Loleta Books, MD Fact Sheet for Healthcare Providers  BankingDealers.co.za Fact Sheet for Patients  StrictlyIdeas.no Performed at [facility], [place] [address] [place], [place] [address]    Coronavirus Source NASOPHARYNGEAL  Final    Comment: Performed at [facility], [address] [place], [place] [address]  Culture, respiratory (tracheal aspirate)     Status: None   Collection Time: 04/01/19  3:36 PM  Result Value Ref Range Status   Specimen Description TRACHEAL ASPIRATE  Final   Special Requests NONE  Final   Gram Stain   Final    ABUNDANT WBC PRESENT, PREDOMINANTLY PMN RARE GRAM POSITIVE COCCI RARE GRAM POSITIVE RODS    Culture   Final    RARE Consistent with normal respiratory flora. Performed at [facility], [place] [address], [place], [place] [address]    Report Status 04/04/2019 FINAL  Final     Labs: Basic Metabolic Panel: Recent Labs  Lab 04/06/19 0214 04/06/19 1605 04/07/19 0638 04/08/19 0600 04/09/19 0443 04/10/19 0504  NA 135  --  135 136 136 136  K 2.9*  --  2.9* 2.6* 3.3* 3.9  CL 95*  --  97* 99 102 103  CO2 28  --  '29 26 23 29  '$ GLUCOSE 104*  --  83 84 82 85  BUN 15  --  '12 11 11 14  '$ CREATININE 0.55*  --  0.63 0.54* 0.58* 0.58*  CALCIUM 7.9*  --  7.9* 7.9* 7.9* 8.1*  MG 1.6* 2.3 1.9  --  1.6* 1.8  PHOS 2.5 1.9* 1.9* 2.0* 2.2*  --    Liver Function Tests: Recent Labs  Lab 04/06/19 0214 04/07/19 0638 04/08/19 0600 04/09/19 0443 04/10/19 0504  AST 132* 109* 73* 61* 47*  ALT 316* 318* 274* 247* 199*  ALKPHOS 67 60 63 63 60  BILITOT 0.6 0.8 0.7 0.8 1.0  PROT 5.2* 5.1* 5.1* 5.4* 5.1*  ALBUMIN 2.3* 2.4* 2.4* 2.5* 2.5*   No results for input(s): LIPASE, AMYLASE in the last 168 hours. No results for input(s): AMMONIA in the last 168 hours. CBC: Recent Labs  Lab 04/04/19 0608 04/06/19 0214 04/07/19 0638 04/08/19 0600 04/09/19 0443  WBC 15.0* 12.6* 12.2* 10.3 11.6*  HGB 12.0* 12.9* 13.6 13.3 13.4  HCT 37.4* 38.1* 40.1 38.4* 40.2  MCV 93.7 88.2 87.9 86.1 87.2  PLT 276 256 257 239 254   Cardiac Enzymes: No results for input(s): CKTOTAL, CKMB, CKMBINDEX,  TROPONINI in the last 168 hours. BNP: BNP (last 3 results) Recent Labs    03/13/19 2304  BNP 383.2*    ProBNP (last 3 results) No results for input(s): PROBNP in the last 8760 hours.  CBG: Recent Labs  Lab 04/09/19 1626 04/09/19 2218 04/10/19 0004 04/10/19 0421 04/10/19 0606  GLUCAP  82 77 79 76 75       Signed:  Florencia Reasons MD, PhD  [facility] 04/10/2019, 12:35 PM

## 2019-04-11 ENCOUNTER — Inpatient Hospital Stay (HOSPITAL_COMMUNITY): Payer: Medicare Other | Admitting: Occupational Therapy

## 2019-04-11 ENCOUNTER — Other Ambulatory Visit: Payer: Self-pay

## 2019-04-11 ENCOUNTER — Inpatient Hospital Stay (HOSPITAL_COMMUNITY): Payer: Medicare Other | Admitting: Speech Pathology

## 2019-04-11 ENCOUNTER — Inpatient Hospital Stay (HOSPITAL_COMMUNITY): Payer: Medicare Other

## 2019-04-11 ENCOUNTER — Encounter (HOSPITAL_COMMUNITY): Payer: Self-pay

## 2019-04-11 ENCOUNTER — Inpatient Hospital Stay (HOSPITAL_COMMUNITY)
Admission: RE | Admit: 2019-04-11 | Discharge: 2019-04-23 | DRG: 945 | Disposition: A | Discharge status: Home Health | Payer: Medicare Other | Source: Intra-hospital | Attending: Physical Medicine & Rehabilitation | Admitting: Physical Medicine & Rehabilitation

## 2019-04-11 DIAGNOSIS — F17210 Nicotine dependence, cigarettes, uncomplicated: Secondary | ICD-10-CM | POA: Diagnosis present

## 2019-04-11 DIAGNOSIS — J449 Chronic obstructive pulmonary disease, unspecified: Secondary | ICD-10-CM | POA: Diagnosis present

## 2019-04-11 DIAGNOSIS — R7989 Other specified abnormal findings of blood chemistry: Secondary | ICD-10-CM

## 2019-04-11 DIAGNOSIS — J441 Chronic obstructive pulmonary disease with (acute) exacerbation: Secondary | ICD-10-CM

## 2019-04-11 DIAGNOSIS — E785 Hyperlipidemia, unspecified: Secondary | ICD-10-CM | POA: Diagnosis present

## 2019-04-11 DIAGNOSIS — Z9981 Dependence on supplemental oxygen: Secondary | ICD-10-CM | POA: Diagnosis not present

## 2019-04-11 DIAGNOSIS — Z7952 Long term (current) use of systemic steroids: Secondary | ICD-10-CM | POA: Diagnosis not present

## 2019-04-11 DIAGNOSIS — E46 Unspecified protein-calorie malnutrition: Secondary | ICD-10-CM | POA: Diagnosis present

## 2019-04-11 DIAGNOSIS — G9340 Encephalopathy, unspecified: Secondary | ICD-10-CM | POA: Diagnosis present

## 2019-04-11 DIAGNOSIS — R5381 Other malaise: Principal | ICD-10-CM | POA: Diagnosis present

## 2019-04-11 DIAGNOSIS — R627 Adult failure to thrive: Secondary | ICD-10-CM | POA: Diagnosis not present

## 2019-04-11 DIAGNOSIS — Z8546 Personal history of malignant neoplasm of prostate: Secondary | ICD-10-CM

## 2019-04-11 DIAGNOSIS — E876 Hypokalemia: Secondary | ICD-10-CM | POA: Diagnosis present

## 2019-04-11 DIAGNOSIS — I4892 Unspecified atrial flutter: Secondary | ICD-10-CM | POA: Diagnosis present

## 2019-04-11 DIAGNOSIS — R0602 Shortness of breath: Secondary | ICD-10-CM

## 2019-04-11 DIAGNOSIS — Z7982 Long term (current) use of aspirin: Secondary | ICD-10-CM

## 2019-04-11 DIAGNOSIS — Z681 Body mass index (BMI) 19 or less, adult: Secondary | ICD-10-CM

## 2019-04-11 DIAGNOSIS — F1010 Alcohol abuse, uncomplicated: Secondary | ICD-10-CM | POA: Diagnosis present

## 2019-04-11 DIAGNOSIS — Z79899 Other long term (current) drug therapy: Secondary | ICD-10-CM | POA: Diagnosis not present

## 2019-04-11 DIAGNOSIS — I2510 Atherosclerotic heart disease of native coronary artery without angina pectoris: Secondary | ICD-10-CM | POA: Diagnosis not present

## 2019-04-11 DIAGNOSIS — G9341 Metabolic encephalopathy: Secondary | ICD-10-CM | POA: Diagnosis not present

## 2019-04-11 DIAGNOSIS — R05 Cough: Secondary | ICD-10-CM | POA: Diagnosis not present

## 2019-04-11 DIAGNOSIS — J431 Panlobular emphysema: Secondary | ICD-10-CM | POA: Diagnosis present

## 2019-04-11 DIAGNOSIS — Z7951 Long term (current) use of inhaled steroids: Secondary | ICD-10-CM

## 2019-04-11 DIAGNOSIS — E440 Moderate protein-calorie malnutrition: Secondary | ICD-10-CM | POA: Diagnosis not present

## 2019-04-11 DIAGNOSIS — I10 Essential (primary) hypertension: Secondary | ICD-10-CM | POA: Diagnosis not present

## 2019-04-11 DIAGNOSIS — Z7902 Long term (current) use of antithrombotics/antiplatelets: Secondary | ICD-10-CM

## 2019-04-11 DIAGNOSIS — J410 Simple chronic bronchitis: Secondary | ICD-10-CM | POA: Diagnosis not present

## 2019-04-11 MED ORDER — ALBUTEROL SULFATE (2.5 MG/3ML) 0.083% IN NEBU
2.5000 mg | INHALATION_SOLUTION | RESPIRATORY_TRACT | Status: DC | PRN
  Administered 2019-04-19: 13:00:00 2.5 mg via RESPIRATORY_TRACT
  Filled 2019-04-11: qty 3

## 2019-04-11 MED ORDER — POLYETHYLENE GLYCOL 3350 17 G PO PACK: 17.0000 g | PACK | Freq: Every day | ORAL | Status: DC | PRN

## 2019-04-11 MED ORDER — TRAZODONE HCL 50 MG PO TABS
25.0000 mg | ORAL_TABLET | Freq: Every evening | ORAL | Status: DC | PRN
  Administered 2019-04-17: 50 mg via ORAL
  Filled 2019-04-11: qty 1

## 2019-04-11 MED ORDER — UMECLIDINIUM BROMIDE 62.5 MCG/INH IN AEPB
1.0000 | INHALATION_SPRAY | Freq: Every day | RESPIRATORY_TRACT | Status: DC
  Administered 2019-04-11 – 2019-04-23 (×13): 1 via RESPIRATORY_TRACT
  Filled 2019-04-11 (×4): qty 7

## 2019-04-11 MED ORDER — FOLIC ACID 1 MG PO TABS
1.0000 mg | ORAL_TABLET | Freq: Every day | ORAL | Status: DC
  Administered 2019-04-11 – 2019-04-23 (×13): 1 mg
  Filled 2019-04-11 (×13): qty 1

## 2019-04-11 MED ORDER — ADULT MULTIVITAMIN W/MINERALS CH
1.0000 | ORAL_TABLET | Freq: Every day | ORAL | Status: DC
  Administered 2019-04-11 – 2019-04-23 (×13): 1 via ORAL
  Filled 2019-04-11 (×13): qty 1

## 2019-04-11 MED ORDER — PROCHLORPERAZINE 25 MG RE SUPP: 12.5000 mg | Freq: Four times a day (QID) | RECTAL | Status: DC | PRN

## 2019-04-11 MED ORDER — GUAIFENESIN-DM 100-10 MG/5ML PO SYRP: 5.0000 mL | ORAL_SOLUTION | Freq: Four times a day (QID) | ORAL | Status: DC | PRN

## 2019-04-11 MED ORDER — NYSTATIN 100000 UNIT/ML MT SUSP
5.0000 mL | Freq: Four times a day (QID) | OROMUCOSAL | Status: DC
  Administered 2019-04-11 – 2019-04-23 (×42): 500000 [IU] via ORAL
  Filled 2019-04-11 (×43): qty 5

## 2019-04-11 MED ORDER — VITAMIN B-1 100 MG PO TABS
100.0000 mg | ORAL_TABLET | Freq: Every day | ORAL | Status: DC
  Administered 2019-04-11 – 2019-04-23 (×13): 100 mg
  Filled 2019-04-11 (×13): qty 1

## 2019-04-11 MED ORDER — RESOURCE THICKENUP CLEAR PO POWD
ORAL | Status: DC | PRN
  Filled 2019-04-11: qty 125

## 2019-04-11 MED ORDER — CHLORHEXIDINE GLUCONATE CLOTH 2 % EX PADS
6.0000 | MEDICATED_PAD | Freq: Every day | CUTANEOUS | Status: DC
  Administered 2019-04-14 – 2019-04-21 (×4): 6 via TOPICAL

## 2019-04-11 MED ORDER — FLUTICASONE FUROATE-VILANTEROL 100-25 MCG/INH IN AEPB
1.0000 | INHALATION_SPRAY | Freq: Every day | RESPIRATORY_TRACT | Status: DC
  Administered 2019-04-11 – 2019-04-23 (×13): 1 via RESPIRATORY_TRACT
  Filled 2019-04-11 (×3): qty 28

## 2019-04-11 MED ORDER — ACETAMINOPHEN 325 MG PO TABS
325.0000 mg | ORAL_TABLET | ORAL | Status: DC | PRN
  Administered 2019-04-22 – 2019-04-23 (×2): 650 mg via ORAL
  Filled 2019-04-11 (×2): qty 2

## 2019-04-11 MED ORDER — BISACODYL 10 MG RE SUPP: 10.0000 mg | Freq: Every day | RECTAL | Status: DC | PRN

## 2019-04-11 MED ORDER — SACCHAROMYCES BOULARDII 250 MG PO CAPS
250.0000 mg | ORAL_CAPSULE | Freq: Two times a day (BID) | ORAL | Status: DC
  Administered 2019-04-11 – 2019-04-23 (×25): 250 mg via ORAL
  Filled 2019-04-11 (×25): qty 1

## 2019-04-11 MED ORDER — FLEET ENEMA 7-19 GM/118ML RE ENEM: 1.0000 | ENEMA | Freq: Once | RECTAL | Status: DC | PRN

## 2019-04-11 MED ORDER — CLOPIDOGREL BISULFATE 75 MG PO TABS
75.0000 mg | ORAL_TABLET | Freq: Every day | ORAL | Status: DC
  Administered 2019-04-11 – 2019-04-23 (×13): 75 mg
  Filled 2019-04-11 (×13): qty 1

## 2019-04-11 MED ORDER — PROCHLORPERAZINE EDISYLATE 10 MG/2ML IJ SOLN: 5.0000 mg | Freq: Four times a day (QID) | INTRAMUSCULAR | Status: DC | PRN

## 2019-04-11 MED ORDER — PROCHLORPERAZINE MALEATE 5 MG PO TABS: 5.0000 mg | ORAL_TABLET | Freq: Four times a day (QID) | ORAL | Status: DC | PRN

## 2019-04-11 MED ORDER — METOPROLOL TARTRATE 12.5 MG HALF TABLET
12.5000 mg | ORAL_TABLET | Freq: Two times a day (BID) | ORAL | Status: DC
  Administered 2019-04-11 – 2019-04-23 (×19): 12.5 mg via ORAL
  Filled 2019-04-11 (×25): qty 1

## 2019-04-11 MED ORDER — PRO-STAT SUGAR FREE PO LIQD
30.0000 mL | Freq: Two times a day (BID) | ORAL | Status: DC
  Administered 2019-04-11 – 2019-04-23 (×25): 30 mL via ORAL
  Filled 2019-04-11 (×25): qty 30

## 2019-04-11 MED ORDER — PANTOPRAZOLE SODIUM 40 MG PO TBEC
40.0000 mg | DELAYED_RELEASE_TABLET | Freq: Every day | ORAL | Status: DC
  Administered 2019-04-11 – 2019-04-22 (×12): 40 mg via ORAL
  Filled 2019-04-11 (×12): qty 1

## 2019-04-11 MED ORDER — DIPHENHYDRAMINE HCL 12.5 MG/5ML PO ELIX: 12.5000 mg | ORAL_SOLUTION | Freq: Four times a day (QID) | ORAL | Status: DC | PRN

## 2019-04-11 MED ORDER — CHLORHEXIDINE GLUCONATE 0.12 % MT SOLN
15.0000 mL | Freq: Two times a day (BID) | OROMUCOSAL | Status: DC
  Administered 2019-04-11 – 2019-04-23 (×24): 15 mL via OROMUCOSAL
  Filled 2019-04-11 (×25): qty 15

## 2019-04-11 MED ORDER — ALUM & MAG HYDROXIDE-SIMETH 200-200-20 MG/5ML PO SUSP: 30.0000 mL | ORAL | Status: DC | PRN

## 2019-04-11 MED ORDER — APIXABAN 5 MG PO TABS
5.0000 mg | ORAL_TABLET | Freq: Two times a day (BID) | ORAL | Status: DC
  Administered 2019-04-11 – 2019-04-23 (×25): 5 mg via ORAL
  Filled 2019-04-11 (×25): qty 1

## 2019-04-11 NOTE — Evaluation (Signed)
Occupational Therapy Assessment and Plan  Patient Details  Name: Joshua Whitaker MRN: [id number] Date of Birth: 08/08/1941  OT Diagnosis: muscle weakness (generalized) Rehab Potential: Rehab Potential (ACUTE ONLY): Good ELOS: 10-14 days   Today's Date: 04/11/2019 OT Individual Time: 0900-1015 OT Individual Time Calculation (min): 75 min     Problem List:  Patient Active Problem List   Diagnosis Date Noted  . Debility 04/11/2019  . Protein-calorie malnutrition, severe 04/10/2019  . Hypokalemia   . Weakness generalized   . Adult failure to thrive   . Pericardial effusion 04/02/2019  . Palliative care by specialist   . DNR (do not resuscitate) discussion   . Lobar pneumonia ([place]) 04/01/2019  . Acute on chronic respiratory failure ([place]) 04/01/2019  . Atrial flutter ([place]) 03/30/2019  . Acute on chronic respiratory failure with hypoxia ([place]) 03/16/2019  . Alcohol abuse 03/16/2019  . Tobacco abuse 03/16/2019  . Medically noncompliant 03/16/2019  . Pressure injury of skin 03/14/2019  . Coronary artery calcification 03/14/2019  . Aortic atherosclerosis ([place]) 03/14/2019  . Chronic obstructive pulmonary disease ([place])   . Hyponatremia   . ST elevation   . Transaminitis   . Nodule of right lung 10/12/2015  . H/O prostate cancer 04/21/2014  . Essential hypertension 03/20/2011  . Hyperlipidemia 03/20/2011  . Panlobular emphysema ([place]) 03/20/2011    Past Medical History:  Past Medical History:  Diagnosis Date  . Alcohol abuse   . Allergy   . Anxiety   . Asthma   . COPD (chronic obstructive pulmonary disease) ([place])   . Coronary artery calcification seen on CT scan   . Depression   . Hyponatremia   . Prostate cancer [facility])    Past Surgical History:  Past Surgical History:  Procedure Laterality Date  . EYE SURGERY    . PROSTATE SURGERY    . SPINE SURGERY      Assessment & Plan Clinical Impression: Patient is a 78 y.o. year old male with history of COPD--oxygen  dependentwith recent admission for exacerbation, chronic hyponatremia, alcohol abuse;who was [facility] to [facility] on 03/30/2019 with 2-day history of cough and shortness of breath progressing to respiratory failure requiring intubation. He was transferred to [facility] on zero 4/7 and COVID-19 ruled out. Hypoxia felt to be due a is COPD exacerbation and HCAP likely due toaspiration.Hospital course significant for Aflutter requiring IV Cardizem for rate control as well as elevated troponins. He started onLovenox and transitioned toEliquis--to continue at least 6 weeks--d/c ASA and outpatient Zio monitor for evaluation of arrhthymias. He had an echo last month showing mild to moderate pericardial effusion without tamponade--to follow up with cards for echo in 1 month.He tolerated extubation on 4/11 and completed course of IV Unasyn.   GOC discussed with family who has elected full scope of treatment. MBS 4/15 revealed aspiration of thins therefore placed on dysphagia 1 nectar liquids. Electrolyte abnormalities with hypokalemia andHypomagnesemiawithIVruns4/16.Respiratory status improving but continues to have issues with weakness impacting ability to carry out ADLs and well as mobility.  Patient transferred to CIR on 04/11/2019 .    Patient currently requires max with basic self-care skills secondary to decreased cardiorespiratoy endurance, decreased safety awareness and decreased memory and decreased standing balance and decreased postural control.  Prior to hospitalization, patient could complete adl with min.  Patient will benefit from skilled intervention to decrease level of assist with basic self-care skills and increase independence with basic self-care skills prior to discharge home with care partner.  Anticipate patient will require  24 hour supervision and minimal physical assistance and follow up home health.  OT - End of Session Activity Tolerance: Tolerates 10 - 20 min activity with  multiple rests Endurance Deficit: Yes Endurance Deficit Description: fatigue with adl tasks, returned to bed and asleep within minutes OT Assessment Rehab Potential (ACUTE ONLY): Good OT Patient demonstrates impairments in the following area(s): Balance;Endurance;Safety;Cognition;Nutrition OT Basic ADL's Functional Problem(s): Eating;Grooming;Bathing;Dressing;Toileting OT Transfers Functional Problem(s): Toilet;Tub/Shower OT Additional Impairment(s): Fuctional Use of Upper Extremity OT Plan OT Intensity: Minimum of 1-2 x/day, 45 to 90 minutes OT Frequency: 5 out of 7 days OT Duration/Estimated Length of Stay: 10-14 days OT Treatment/Interventions: Balance/vestibular training;Community reintegration;Patient/family education;Self Care/advanced ADL retraining;Therapeutic Exercise;UE/LE Coordination activities;Cognitive remediation/compensation;Discharge planning;DME/adaptive equipment instruction;Functional mobility training;Therapeutic Activities OT Self Feeding Anticipated Outcome(s): mod I OT Basic Self-Care Anticipated Outcome(s): supervision/min a OT Toileting Anticipated Outcome(s): min a OT Bathroom Transfers Anticipated Outcome(s): supervision OT Recommendation Patient destination: Home Follow Up Recommendations: Home health OT   Skilled Therapeutic Intervention Patient in bed and willing to participate in therapy session.  O2 via [place].  He presents as confused but cooperative and able to express basic needs.  Evaluation completed as documented below.  Reviewed role of OT, schedule for therapy, safety with mobility, plan of care and goals for therapy - patient with poor recall but states that he wants to be able to walk better.   Bed mobility SSP to/from supine with mod a.  SPT to/from bed, w/c and bedside commode with mod A and increased time.  Patient is continent of bowel movement but requires max A for hygiene and clothing management.  Sponge bath completed sitting in w/c at sink -  mod / max A for thoroughness.  UB dressing with mod A, LB dressing max A.  Patient returned to bed at close of session - asleep within minutes of laying down.  Bed alarm set and call bell/tray table in reach.    OT Evaluation Precautions/Restrictions  Precautions Precautions: Fall Restrictions Weight Bearing Restrictions: No General Chart Reviewed: Yes Vital Signs Therapy Vitals Temp: 98.1 F (36.7 C) Temp Source: Oral Pulse Rate: 79 Resp: (!) 26(Notified RN) BP: 118/60 Patient Position (if appropriate): Lying Oxygen Therapy SpO2: 98 % O2 Device: Nasal Cannula O2 Flow Rate (L/min): 2 L/min Pain Pain Assessment Pain Scale: 0-10 Pain Score: 0-No pain Home Living/Prior Functioning Home Living Family/patient expects to be discharged to:: Private residence Living Arrangements: Spouse/significant other Available Help at Discharge: Family Type of Home: House Home Access: Level entry Home Layout: Multi-level, Able to live on main level with bedroom/bathroom Alternate Level Stairs-Number of Steps: flight Bathroom Shower/Tub: Chiropodist: Standard Additional Comments: Daughter lives about 25 min away, has 2 grown sons who can help as well. Pt and his wife live in an old farmhouse that is heated by a wood stove.  Lives With: Spouse IADL History Education: 5th grade education Prior Function Level of Independence: Needs assistance with gait, Needs assistance with tranfers Driving: No Vocation: Retired(dairy farmer) Comments: Daughter's biggest concern is getting Dad to eat so that he can get better -improve energy and strength ADL ADL Grooming: Moderate assistance Where Assessed-Grooming: Wheelchair Upper Body Bathing: Moderate assistance Where Assessed-Upper Body Bathing: Sitting at sink, Wheelchair Lower Body Bathing: Maximal assistance Where Assessed-Lower Body Bathing: Sitting at sink, Wheelchair, Standing at sink Upper Body Dressing: Moderate  assistance Where Assessed-Upper Body Dressing: Wheelchair Lower Body Dressing: Maximal assistance Where Assessed-Lower Body Dressing: Wheelchair Toileting: Maximal assistance Where Assessed-Toileting: Bedside Commode Toilet Transfer:  Moderate assistance Toilet Transfer Method: Stand pivot Toilet Transfer Equipment: Bedside commode Vision Baseline Vision/History: Wears glasses Patient Visual Report: No change from baseline Vision Assessment?: Yes Eye Alignment: Within Functional Limits Alignment/Gaze Preference: Within Defined Limits Tracking/Visual Pursuits: Able to track stimulus in all quads without difficulty Saccades: Decreased speed of saccadic movement Convergence: Within functional limits Visual Fields: No apparent deficits Perception    Praxis   Cognition Overall Cognitive Status: History of cognitive impairments - at baseline Arousal/Alertness: Awake/alert Orientation Level: Person;Place;Situation Person: Oriented Place: Disoriented Situation: Disoriented Year: 2018 Month: October Day of Week: Incorrect Memory: Impaired Memory Impairment: Decreased recall of new information;Decreased short term memory Decreased Short Term Memory: Verbal basic Immediate Memory Recall: Sock;Blue;Bed Attention: Sustained Sustained Attention: Appears intact Sustained Attention Impairment: Verbal basic Selective Attention: Appears intact Awareness: Impaired Awareness Impairment: Emergent impairment Problem Solving: Impaired Problem Solving Impairment: Functional basic Safety/Judgment: Impaired Sensation Sensation Light Touch: Appears Intact Hot/Cold: Appears Intact Coordination Gross Motor Movements are Fluid and Coordinated: No Fine Motor Movements are Fluid and Coordinated: Yes Finger Nose Finger Test: smooth but slowed bilaterally Motor  Motor Motor: Within Functional Limits Mobility  Bed Mobility Bed Mobility: Sit to Supine;Supine to Sit Supine to Sit: Moderate  Assistance - Patient 50-74% Sit to Supine: Moderate Assistance - Patient 50-74% Transfers Sit to Stand: Moderate Assistance - Patient 50-74% Stand to Sit: Contact Guard/Touching assist  Trunk/Postural Assessment  Postural Control Postural Control: Deficits on evaluation  Balance Static Sitting Balance Static Sitting - Level of Assistance: 5: Stand by assistance Dynamic Sitting Balance Dynamic Sitting - Level of Assistance: 5: Stand by assistance Static Standing Balance Static Standing - Level of Assistance: 3: Mod assist Dynamic Standing Balance Dynamic Standing - Level of Assistance: 3: Mod assist Extremity/Trunk Assessment RUE Assessment RUE Assessment: Exceptions to [facility] Passive Range of Motion (PROM) Comments: shoulder flexion/abd to 90, distal WFLs Active Range of Motion (AROM) Comments: shoulder flexion/abd to approx 90, distal WFLs General Strength Comments: proximal 2+/5, distal 4/5 LUE Assessment LUE Assessment: Exceptions to [facility] Passive Range of Motion (PROM) Comments: shoulder flex/abd to 90 Active Range of Motion (AROM) Comments: shoulder flex/abd to approx 90 General Strength Comments: proximal 2+/5, distal 4/5     Refer to Care Plan for Long Term Goals  Recommendations for other services: None    Discharge Criteria: Patient will be discharged from OT if patient refuses treatment 3 consecutive times without medical reason, if treatment goals not met, if there is a change in medical status, if patient makes no progress towards goals or if patient is discharged from hospital.  The above assessment, treatment plan, treatment alternatives and goals were discussed and mutually agreed upon: by patient  Carlos Levering 04/11/2019, 4:26 PM

## 2019-04-11 NOTE — Evaluation (Signed)
Speech Language Pathology Assessment and Plan  Patient Details  Name: Joshua Whitaker MRN: [id number] Date of Birth: 08-12-1941  SLP Diagnosis: Dysphagia  Rehab Potential: Good ELOS: 11-16 days   Today's Date: 04/11/2019 SLP Individual Time: 0825-0900 SLP Individual Time Calculation (min): 35 min   Problem List:  Patient Active Problem List   Diagnosis Date Noted  . Debility 04/11/2019  . Protein-calorie malnutrition, severe 04/10/2019  . Hypokalemia   . Weakness generalized   . Adult failure to thrive   . Pericardial effusion 04/02/2019  . Palliative care by specialist   . DNR (do not resuscitate) discussion   . Lobar pneumonia ([place]) 04/01/2019  . Acute on chronic respiratory failure ([place]) 04/01/2019  . Atrial flutter ([place]) 03/30/2019  . Acute on chronic respiratory failure with hypoxia ([place]) 03/16/2019  . Alcohol abuse 03/16/2019  . Tobacco abuse 03/16/2019  . Medically noncompliant 03/16/2019  . Pressure injury of skin 03/14/2019  . Coronary artery calcification 03/14/2019  . Aortic atherosclerosis ([place]) 03/14/2019  . Chronic obstructive pulmonary disease ([place])   . Hyponatremia   . ST elevation   . Transaminitis   . Nodule of right lung 10/12/2015  . H/O prostate cancer 04/21/2014  . Essential hypertension 03/20/2011  . Hyperlipidemia 03/20/2011  . Panlobular emphysema ([place]) 03/20/2011   Past Medical History:  Past Medical History:  Diagnosis Date  . Alcohol abuse   . Allergy   . Anxiety   . Asthma   . COPD (chronic obstructive pulmonary disease) ([place])   . Coronary artery calcification seen on CT scan   . Depression   . Hyponatremia   . Prostate cancer [facility])    Past Surgical History:  Past Surgical History:  Procedure Laterality Date  . EYE SURGERY    . PROSTATE SURGERY    . SPINE SURGERY      Assessment / Plan / Recommendation Clinical Impression EDVARDO HONSE is a 78 year old male with history of COPD--oxygen dependentwith recent  admission for exacerbation, chronic hyponatremia, alcohol abuse;who was admitted to [facility] on 03/30/2019 with 2-day history of cough and shortness of breath progressing to respiratory failure requiring intubation. He was transferred to [facility] on zero 4/7 and COVID-19 ruled out. Hypoxia felt to be due a is COPD exacerbation and HCAP likely due toaspiration.Hospital course significant for Aflutter requiring IV Cardizem for rate control as well as elevated troponins. He started onLovenox and transitioned toEliquis--to continue at least 6 weeks--d/c ASA and outpatient Zio monitor for evaluation of arrhthymias. He had an echo last month showing mild to moderate pericardial effusion without tamponade--to follow up with cards for echo in 1 month.He tolerated extubation on 4/11 and completed course of IV Unasyn.   Clinical swallow evaluation was completed. Patient presents with a mild oral dysphagia with a known history of pharyngeal dysphagia per MBS completed on 04/07/19. MBS completed on 04/07/19 revealed silent aspiration of thin liquids with reduced hyolaryngeal movement, epiglottic deflection, and base of tongue retraction. At bedside this date patient demonstrated mildly prolonged mastication of regular solid consistency. Patient becomes easily fatigued with mastication of solid consistency. Mild lingual residues were observed which patient cleared with a nectar thick liquid wash. No overt s/sx of aspiration were observed with puree, solid, or nectar thick consistency. Trials of thin liquids were deferred due to known hx of silent aspiration.  Patient presents with baseline cognitive deficits. Patient's performance on cognitive-linguistic evaluation was impacted by patient's limited education level. Patient reports he went to school through 5th grade. Patient  demonstrates difficulty with: delayed recall, attention, and reasoning. Patient's speech is 100% intelligible with no dysarthria or apraxia noted.  Patient's basic expressive and receptive language skills are [facility]. Patient appropriately responds to basic yes/no questions and independently follows basic single step commands.   Patient would benefit from skilled SLP services during rehab stay targeting dysphagia.   Skilled Therapeutic Interventions          Bedside swallow evaluation and cognitive-linguistic evaluation  SLP Assessment  Patient will need skilled Speech Lanaguage Pathology Services during CIR admission    Recommendations  SLP Diet Recommendations: Dysphagia 1 (Puree);Nectar Liquid Administration via: Cup;Straw Medication Administration: Whole meds with liquid Supervision: Patient able to self feed;Intermittent supervision to cue for compensatory strategies Compensations: Small sips/bites;Slow rate Postural Changes and/or Swallow Maneuvers: Seated upright 90 degrees Oral Care Recommendations: Oral care BID Patient destination: Home Follow up Recommendations: Home Health SLP;Outpatient SLP Equipment Recommended: To be determined    SLP Frequency 3 to 5 out of 7 days   SLP Duration  SLP Intensity  SLP Treatment/Interventions 11-16 days  Minumum of 1-2 x/day, 30 to 90 minutes  Dysphagia/aspiration precaution training;Patient/family education;Therapeutic Exercise    Pain Pain Assessment Pain Scale: 0-10 Pain Score: 0-No pain Faces Pain Scale: No hurt  Prior Functioning Cognitive/Linguistic Baseline: Baseline deficits Baseline deficit details: At baseline patient has a 5th grade education.  Type of Home: House  Lives With: Spouse Available Help at Discharge: Family Education: 5th grade education Vocation: Retired  Industrial/product designer Term Goals: Week 1: SLP Short Term Goal 1 (Week 1): Patient will tolerate pureed diet and nectar thick liquids without overt s/sx of aspiration. SLP Short Term Goal 2 (Week 1): Patient will complete pharyngeal strengthening exercises with mod A. SLP Short Term Goal 3 (Week 1): Patient will  demonstrate functional mastication and oral clearance of dysphagia 2 textures. SLP Short Term Goal 4 (Week 1): Patient will tolerate trials of thin liquids with no overt s/sx of aspriation.  Refer to Care Plan for Long Term Goals  Recommendations for other services: None   Discharge Criteria: Patient will be discharged from SLP if patient refuses treatment 3 consecutive times without medical reason, if treatment goals not met, if there is a change in medical status, if patient makes no progress towards goals or if patient is discharged from hospital.  The above assessment, treatment plan, treatment alternatives and goals were discussed and mutually agreed upon: by patient  Cristy Folks, [place], [place] 04/11/2019, 2:44 PM

## 2019-04-11 NOTE — Evaluation (Signed)
Physical Therapy Assessment and Plan  Patient Details  Name: Joshua Whitaker MRN: [id number] Date of Birth: [birth year]  PT Diagnosis: Abnormal posture, Abnormality of gait, Cognitive deficits, Contracture of joint: bil knees and Muscle weakness Rehab Potential: Good ELOS: 10-12   Today's Date: 04/11/2019 PT Individual Time:1120-1220  - , 60 min      Problem List:  Patient Active Problem List   Diagnosis Date Noted  . Debility 04/11/2019  . Protein-calorie malnutrition, severe 04/10/2019  . Hypokalemia   . Weakness generalized   . Adult failure to thrive   . Pericardial effusion 04/02/2019  . Palliative care by specialist   . DNR (do not resuscitate) discussion   . Lobar pneumonia ([place]) 04/01/2019  . Acute on chronic respiratory failure ([place]) 04/01/2019  . Atrial flutter ([place]) 03/30/2019  . Acute on chronic respiratory failure with hypoxia ([place]) 03/16/2019  . Alcohol abuse 03/16/2019  . Tobacco abuse 03/16/2019  . Medically noncompliant 03/16/2019  . Pressure injury of skin 03/14/2019  . Coronary artery calcification 03/14/2019  . Aortic atherosclerosis ([place]) 03/14/2019  . Chronic obstructive pulmonary disease ([place])   . Hyponatremia   . ST elevation   . Transaminitis   . Nodule of right lung 10/12/2015  . H/O prostate cancer 04/21/2014  . Essential hypertension 03/20/2011  . Hyperlipidemia 03/20/2011  . Panlobular emphysema ([place]) 03/20/2011    Past Medical History:  Past Medical History:  Diagnosis Date  . Alcohol abuse   . Allergy   . Anxiety   . Asthma   . COPD (chronic obstructive pulmonary disease) ([place])   . Coronary artery calcification seen on CT scan   . Depression   . Hyponatremia   . Prostate cancer [facility])    Past Surgical History:  Past Surgical History:  Procedure Laterality Date  . EYE SURGERY    . PROSTATE SURGERY    . SPINE SURGERY      Assessment & Plan Clinical Impression: Joshua Whitaker is a 78 year old male with history of  COPD--oxygen dependentwith recent admission for exacerbation, chronic hyponatremia, alcohol abuse;who [facility] with 2-day history of cough and shortness of breath progressing to respiratory failure requiring intubation. He was transferred to [facility] on zero 4/7 and COVID-19 ruled out. Hypoxia felt to be due a is COPD exacerbation and HCAP likely due toaspiration.Hospital course significant for Aflutter requiring IV Cardizem for rate control as well as elevated troponins. He started onLovenox and transitioned toEliquis--to continue at least 6 weeks--d/c ASA and outpatient Zio monitor for evaluation of arrhthymias. He had an echo last month showing mild to moderate pericardial effusion without tamponade--to follow up with cards for echo in 1 month.He tolerated extubation on 4/11 and completed course of IV Unasyn.   GOC discussed with family who has elected full scope of treatment. MBS 4/15 revealed aspiration of thins therefore placed on dysphagia 1 nectar liquids. Electrolyte abnormalities with hypokalemia andHypomagnesemiawithIVruns4/16.Respiratory status improving but continues to have issues with weakness impacting ability to carry out ADLs and well as mobility.  Patient transferred to CIR on 04/11/2019 .   Patient currently requires mod with mobility secondary to muscle weakness and muscle joint tightness, decreased cardiorespiratoy endurance and decreased oxygen support, decreased initiation, decreased awareness, decreased problem solving, decreased safety awareness, decreased memory and delayed processing and decreased sitting balance, decreased standing balance, decreased postural control and decreased balance strategies.  Prior to hospitalization, patient was min assist for mobility, and left the house only for medical appts, He  lives with Spouse in a House home.  Home access is  level.  Dtr states per chart that pt has poor intake.   Patient will benefit  from skilled PT intervention to maximize safe functional mobility, minimize fall risk and decrease caregiver burden for planned discharge home with 24 hour supervision.  Anticipate patient will benefit from follow up [place] at discharge.  PT - End of Session Activity Tolerance: Tolerates < 10 min activity with changes in vital signs Endurance Deficit: Yes Endurance Deficit Description: fatigue limiting w/c propulsion to 20', gait to 5' PT Assessment Rehab Potential (ACUTE/IP ONLY): Good PT Patient demonstrates impairments in the following area(s): Balance;Endurance;Motor;Safety PT Transfers Functional Problem(s): Bed Mobility;Bed to Chair;Car;Furniture PT Locomotion Functional Problem(s): Wheelchair Mobility;Ambulation PT Plan PT Intensity: Minimum of 1-2 x/day ,45 to 90 minutes PT Frequency: 5 out of 7 days PT Duration Estimated Length of Stay: 10-12 PT Treatment/Interventions: Ambulation/gait training;Community reintegration;DME/adaptive equipment instruction;Neuromuscular re-education;Psychosocial support;UE/LE Strength taining/ROM;Wheelchair propulsion/positioning;Balance/vestibular training;Discharge planning;Therapeutic Activities;UE/LE Coordination activities;Cognitive remediation/compensation;Functional mobility training;Patient/family education;Splinting/orthotics;Therapeutic Exercise PT Transfers Anticipated Outcome(s): S basic, min assist car PT Locomotion Anticipated Outcome(s): S w/c x 150', S gait x 50' with LRAD PT Recommendation Follow Up Recommendations: Home health PT Patient destination: Home Equipment Recommended: Wheelchair (measurements);Wheelchair cushion (measurements) Equipment Details: would benefit from a w/c for community distances, given frailty  Skilled Therapeutic Intervention Pt resting in bed.  He was oriented to name and being "in rehab" .  He stated that he needed to get his wife to pick him up so that he could go home.  PT provided support and explained need  to be at [facility] for now.  Pt on 2L O2 via [place].  Pt has malformation of L nare, and often pulled out the canula; PT reversed it and pt found it more comfortable.  PT informed Margaretha Sheffield, LPN .  See evaluation below.  Pt sat EOB x 5 minutes without c/o dizziness.  Sit> stand varied from CGA> mod assist as pt fatigued.  PT instructed pt in w/c propulsion using bil hands; he propelled w/c 20' before fatiguing and stating he "was out of breath".  O2 sats 96% and HR 81 after w/c use. Gait with RW over level tile; pt noted to have stooped posture primarily due to tight hamstrings.  He stated that he rests on his sofa at home, in sitting.  At end of session,  W/c> recliner with min assist and use of RW.   Pt left in recliner with bil LEs elevated and back recliner, with seat belt alarm set.      PT Evaluation Precautions/Restrictions Precautions Precautions: Fall Restrictions Weight Bearing Restrictions: No General   Vital SignsTherapy Vitals Resp: (!) 26(Notified RN) Pain Pain Assessment Pain Scale: 0-10 Pain Score: 0-No pain Home Living/Prior Functioning Home Living Available Help at Discharge: Family Type of Home: House Home Access: Level entry Home Layout: Multi-level;Able to live on main level with bedroom/bathroom Alternate Level Stairs-Number of Steps: flight Bathroom Shower/Tub: Chiropodist: Standard Additional Comments: Daughter lives about 25 min away, has 2 grown sons who can help as well. Pt and his wife live in an old farmhouse that is heated by a wood stove.  Lives With: Spouse Prior Function Level of Independence: Needs assistance with gait;Needs assistance with tranfers(chart states RW) Driving: No Vocation: Retired(dairy farmer) Comments: Daughter's biggest concern is getting Dad to eat so that he can get better -improve energy and strength Vision/Perception  Vision - Assessment Eye Alignment: Within Functional Limits Alignment/Gaze Preference:  Within Defined  Limits Tracking/Visual Pursuits: Able to track stimulus in all quads without difficulty Saccades: Decreased speed of saccadic movement Convergence: Within functional limits  Cognition Overall Cognitive Status: History of cognitive impairments - at baseline Arousal/Alertness: Awake/alert Attention: Sustained Sustained Attention: Appears intact Sustained Attention Impairment: Verbal basic Memory: Impaired Memory Impairment: Decreased recall of new information;Decreased short term memory Decreased Short Term Memory: Verbal basic Awareness: Impaired Awareness Impairment: Emergent impairment Problem Solving: Impaired Problem Solving Impairment: Functional basic Safety/Judgment: Impaired Sensation Sensation Light Touch: Appears Intact Hot/Cold: Appears Intact Proprioception: Not tested Coordination Gross Motor Movements are Fluid and Coordinated: No Fine Motor Movements are Fluid and Coordinated: Yes Finger Nose Finger Test: smooth but slowed bilaterally Heel Shin Test: excursion limited bil, L limited more than R Motor  Motor Motor: Within Functional Limits Motor - Skilled Clinical Observations: generalized weakness  Mobility Bed Mobility Bed Mobility: Rolling Right;Rolling Left;Left Sidelying to Sit Rolling Right: Supervision/verbal cueing Rolling Left: Supervision/Verbal cueing Left Sidelying to Sit: Moderate Assistance - Patient 50-74% Supine to Sit: Moderate Assistance - Patient 50-74% Sit to Supine: Moderate Assistance - Patient 50-74% Transfers Transfers: Sit to Stand;Stand to Sit;Stand Pivot Transfers Sit to Stand: Contact Guard/Touching assist Stand to Sit: Minimal Assistance - Patient > 75% Stand Pivot Transfers: Minimal Assistance - Patient > 75% Stand Pivot Transfer Details: Verbal cues for technique Transfer (Assistive device): None Locomotion  Gait Ambulation: Yes Gait Assistance: Minimal Assistance - Patient > 75% Gait Distance (Feet): 5 Feet Assistive  device: Rolling walker Gait Assistance Details: Verbal cues for technique;Verbal cues for safe use of DME/AE Gait Gait: Yes Gait Pattern: Impaired Gait Pattern: Trunk flexed;Narrow base of support;Decreased trunk rotation;Left flexed knee in stance;Right flexed knee in stance;Decreased step length - left;Decreased step length - right Gait velocity: slowed Stairs / Additional Locomotion Stairs: No Wheelchair Mobility Wheelchair Mobility: Yes Wheelchair Assistance: Dependent - Patient 0%(pt could not figure out where to put hands) Wheelchair Parts Management: Needs assistance Distance: with assistance for hand placement, pt propelled 20' using bil UEs, min assist  Trunk/Postural Assessment  Cervical Assessment Cervical Assessment: Exceptions to WFL(forward head) Thoracic Assessment Thoracic Assessment: Exceptions to WFL(kyphotic) Lumbar Assessment Lumbar Assessment: Exceptions to WFL(posterior pelvic tilt) Postural Control Postural Control: Deficits on evaluation Postural Limitations: pt's tight hamstrings and bil LE weakness result in crouched posture in standing  Balance Balance Balance Assessed: Yes Static Sitting Balance Static Sitting - Level of Assistance: 5: Stand by assistance Dynamic Sitting Balance Dynamic Sitting - Level of Assistance: 5: Stand by assistance Static Standing Balance Static Standing - Level of Assistance: 4: Min assist Dynamic Standing Balance Dynamic Standing - Level of Assistance: 4: Min assist(for gait wiht RW) Extremity Assessment  RUE Assessment RUE Assessment: Exceptions to [facility] Passive Range of Motion (PROM) Comments: shoulder flexion/abd to 90, distal WFLs Active Range of Motion (AROM) Comments: shoulder flexion/abd to approx 90, distal WFLs General Strength Comments: proximal 2+/5, distal 4/5 LUE Assessment LUE Assessment: Exceptions to [facility] Passive Range of Motion (PROM) Comments: shoulder flex/abd to 90 Active Range of Motion (AROM)  Comments: shoulder flex/abd to approx 90 General Strength Comments: proximal 2+/5, distal 4/5 RLE Assessment RLE Assessment: Within Functional Limits Passive Range of Motion (PROM) Comments: tight hamstrings General Strength Comments: grossly in sitting 4-/5 hip flexion and abduction, 4/5 knee extension and ankle DF LLE Assessment LLE Assessment: Within Functional Limits Passive Range of Motion (PROM) Comments: tight hamstrings General Strength Comments: grossly in sitting 3-/5 hip flexion, 4-/5 hip abduction , 4/5 knee extension  and ankle DF    Refer to Care Plan for Long Term Goals  Recommendations for other services: None   Discharge Criteria: Patient will be discharged from PT if patient refuses treatment 3 consecutive times without medical reason, if treatment goals not met, if there is a change in medical status, if patient makes no progress towards goals or if patient is discharged from hospital.  The above assessment, treatment plan, treatment alternatives and goals were discussed and mutually agreed upon: by patient  Kyo Cocuzza 04/11/2019, 5:52 PM

## 2019-04-11 NOTE — Progress Notes (Signed)
[place] PHYSICAL MEDICINE & REHABILITATION PROGRESS NOTE   Subjective/Complaints: No new issues this morning. Slept well. Occasional cough but no new shortness of breath. On 2L of oxygen at home  ROS: Patient denies fever, rash, sore throat, blurred vision, nausea, vomiting, diarrhea, cough,   or chest pain, joint or back pain, headache, or mood change.    Objective:   No results found. Recent Labs    04/09/19 0443  WBC 11.6*  HGB 13.4  HCT 40.2  PLT 254   Recent Labs    04/09/19 0443 04/10/19 0504  NA 136 136  K 3.3* 3.9  CL 102 103  CO2 23 29  GLUCOSE 82 85  BUN 11 14  CREATININE 0.58* 0.58*  CALCIUM 7.9* 8.1*    Intake/Output Summary (Last 24 hours) at 04/11/2019 1121 Last data filed at 04/11/2019 0635 Gross per 24 hour  Intake -  Output 300 ml  Net -300 ml     Physical Exam: Vital Signs Blood pressure 132/60, pulse 75, temperature (!) 97.5 F (36.4 C), temperature source Oral, resp. rate 17, height 5\' 10"  (1.778 m), weight 54 kg, SpO2 98 %. Constitutional: No distress . Vital signs reviewed. HEENT: EOMI, oral membranes moist,  Neck: supple Cardiovascular: RRR without murmur. No JVD    Respiratory: CTA Bilaterally without wheezes or rales. Normal effort   +[place] GI: BS +, non-tender, non-distended  Neurological: He is alert. Oriented to place. More focused, more aware of surroundings. UE 4/5 prox to distal. LE: 3/5 HF, KE and 4/5 distally. Senses pain and light touch in all 4's. Skin: Skin iswarm. He isnot diaphoretic.  Psychiatric: Less distracted today. No anxiety or restlessness    Assessment/Plan: 1. Functional deficits secondary to debility/encephalopathy which require 3+ hours per day of interdisciplinary therapy in a comprehensive inpatient rehab setting.  Physiatrist is providing close team supervision and 24 hour management of active medical problems listed below.  Physiatrist and rehab team continue to assess barriers to  discharge/monitor patient progress toward functional and medical goals  Care Tool:  Bathing              Bathing assist       Upper Body Dressing/Undressing Upper body dressing        Upper body assist      Lower Body Dressing/Undressing Lower body dressing            Lower body assist       Toileting Toileting    Toileting assist Assist for toileting: Maximal Assistance - Patient 25 - 49%     Transfers Chair/bed transfer  Transfers assist           Locomotion Ambulation   Ambulation assist              Walk 10 feet activity   Assist           Walk 50 feet activity   Assist           Walk 150 feet activity   Assist           Walk 10 feet on uneven surface  activity   Assist           Wheelchair     Assist               Wheelchair 50 feet with 2 turns activity    Assist            Wheelchair 150 feet activity  Assist          Medical Problem List and Plan: 1.Functional and mobility deficitssecondary to debility and encephalopathy after respiratory failure Patient is beginning CIR therapies today including PT and OT and SLP 2. Antithrombotics: -DVT/anticoagulation:Pharmaceutical:Other (comment)--Eliquis -antiplatelet therapy: Plavix (no ASA) 3. Pain Management:tylenol prn as well as local measures prn. 4. Mood:LCSW to follow for evaluation and support. -antipsychotic agents: N/A 5. Neuropsych: This patientis notcapable of making decisions onhisown behalf. 6. Skin/Wound Care:routine pressure relief measures. 7. Fluids/Electrolytes/Nutrition:Monitor I/O. Check BMET and Mg levels Monday.  8. CAD/A Flutter: Continue metoprolol 12.5 mg bid with Apixaban and Plavix.  9. COPD- oxygen dependent: Respiratory status improving. Continue Incruse and Ellipta daily with albuterol prn.   -on home O2 10 Abnormal LFTs: Improving--recheck  on Monday.  11. Leukocytosis: Resolving.     -afebrile. No active signs of infection 12. Moderae protein Malnutrition: continue prostat. Recheck Mg, Phos levels on Monday. Encourage PO,  -consider app stimulant    LOS: 0 days A FACE TO FACE EVALUATION WAS PERFORMED  Meredith Staggers 04/11/2019, 11:21 AM

## 2019-04-11 NOTE — Progress Notes (Signed)
Pt. transported to 4W-26 via bed; alert and oriented x3; no c/o's noted. Pt.'s goldtone wedding band ring in chart transported to unit.

## 2019-04-11 NOTE — Progress Notes (Signed)
Patient transported to unit from [address]. Patient alert and oriented x 3 can be forgetful at times.  Patient in bed, vital signs stable WNL. Patient complains of no pain. 3 side rails up on bed, bed alarm in place, patient oriented to call light is within reach. Patient has condom cath, is intact draining well. IV to left antecubital intact, dressing dry. No concerns or complaints voiced, will continue to monitor and assess.

## 2019-04-11 NOTE — H&P (Signed)
Physical Medicine and Rehabilitation Admission H&P        Chief Complaint  Patient presents with  . Debility      HPI:  Joshua Whitaker is a 78 year old male with history of COPD --oxygen dependent with recent admission for exacerbation, chronic hyponatremia, alcohol abuse; who was admitted to [facility] on 03/30/2019 with 2-day history of cough and shortness of breath progressing to respiratory failure requiring intubation.  He was transferred to [facility] on zero 4/7 and COVID-19 ruled out.  Hypoxia felt to be due a is COPD exacerbation and HCAP likely due to aspiration.  Hospital course significant for A flutter requiring IV Cardizem for rate control as well as elevated troponins.  He started on  Lovenox and transitioned to Eliquis--to continue at least 6 weeks--d/c ASA and outpatient Zio monitor for evaluation of arrhthymias.   He had an echo last month showing mild to moderate pericardial effusion without tamponade--to follow up with cards for echo in 1 month.  He tolerated extubation on 4/11 and completed course of IV Unasyn.     GOC discussed with family who has elected full scope of treatment.  MBS 4/15 revealed aspiration of thins therefore placed on dysphagia 1 nectar liquids.  Electrolyte abnormalities with hypokalemia and  Hypomagnesemia with IV runs 4/16.  Respiratory status improving but continues to have issues with weakness impacting ability to carry out ADLs and well as mobility. CIR recommended due to functional decline.      Review of Systems  Constitutional: Negative for chills and fever.  HENT: Negative for hearing loss and tinnitus.   Eyes: Negative for blurred vision and double vision.  Respiratory: Positive for shortness of breath. Negative for cough.   Cardiovascular: Negative for chest pain, palpitations and leg swelling.  Gastrointestinal: Negative for heartburn and nausea.  Genitourinary: Negative for dysuria and urgency.  Musculoskeletal: Positive for myalgias.  Skin:  Negative for rash.  Neurological: Positive for weakness. Negative for dizziness, speech change and headaches.  Psychiatric/Behavioral: The patient is not nervous/anxious.           Past Medical History:  Diagnosis Date  . Alcohol abuse    . Allergy    . Anxiety    . Asthma    . COPD (chronic obstructive pulmonary disease) ([place])    . Coronary artery calcification seen on CT scan    . Depression    . Hyponatremia    . Prostate cancer [facility])             Past Surgical History:  Procedure Laterality Date  . EYE SURGERY      . PROSTATE SURGERY      . SPINE SURGERY               Family History  Problem Relation Age of Onset  . Heart disease Mother    . Arthritis Mother    . Heart disease Father    . Cancer Sister          kidney  . Cancer Brother          lung  . Cancer Brother          lung  . Stroke Brother    . Cancer Brother          prostate  . Cancer Sister          colon  . Cancer Sister          bone  . Diabetes Sister    .  Chromosomal disorder Sister    . Alcohol abuse Daughter    . Rheum arthritis Daughter    . Rheum arthritis Daughter        Social History:  Married. Used to work on a dairy farm. Has needed assist with ADLs and non-ambulatory since last admission.  He reports that he has been smoking cigarettes. He has been smoking about  200 pack per day. He has never used smokeless tobacco. He reports current alcohol use--2 beers daily and a pint of liquor per week. He reports that he does not use drugs.          Allergies  Allergen Reactions  . Tiotropium Bromide Monohydrate Other (See Comments)      Severe urinary retention  . Varenicline Other (See Comments)      Psychiatric and dreams  . Iodinated Diagnostic Agents    . Statins        Medications Prior to Admission  Medication Sig Dispense Refill  . aspirin 325 MG tablet Take 650 mg by mouth daily as needed for mild pain.       Marland Kitchen atorvastatin (LIPITOR) 80 MG tablet Take 1 tablet (80 mg  total) by mouth daily at 6 PM. 90 tablet 1  . clopidogrel (PLAVIX) 75 MG tablet Take 1 tablet (75 mg total) by mouth daily. 30 tablet 0  . escitalopram (LEXAPRO) 5 MG tablet Take 5 mg by mouth every morning.      . fluticasone (FLONASE) 50 MCG/ACT nasal spray Place 1 spray into both nostrils daily. 16 g 5  . Fluticasone-Umeclidin-Vilant (TRELEGY ELLIPTA) 100-62.5-25 MCG/INH AEPB Inhale 1 puff into the lungs daily. 28 each 5  . folic acid (FOLVITE) 1 MG tablet Take 1 tablet (1 mg total) by mouth daily. 30 tablet 0  . Ipratropium-Albuterol (COMBIVENT RESPIMAT) 20-100 MCG/ACT AERS respimat Inhale 2 puffs into the lungs every 6 (six) hours. 1 Inhaler 5  . megestrol (MEGACE) 400 MG/10ML suspension Take 10 mLs (400 mg total) by mouth 2 (two) times daily. For appetite stimulation 600 mL 2  . predniSONE (DELTASONE) 10 MG tablet Take 5 daily for 3 days followed by 4,3,2 and 1 for 3 days each. 45 tablet 0  . PROAIR HFA 108 (90 Base) MCG/ACT inhaler Inhale 1-2 puffs into the lungs every 6 (six) hours as needed for wheezing or shortness of breath. (Patient taking differently: Inhale 1-2 puffs into the lungs every 6 (six) hours as needed for wheezing or shortness of breath. ) 8.5 g 2  . thiamine 100 MG tablet Take 1 tablet (100 mg total) by mouth daily. 30 tablet 0      Drug Regimen Review  Drug regimen was reviewed and remains appropriate with no significant issues identified   Home: Home Living Family/patient expects to be discharged to:: Private residence Living Arrangements: Spouse/significant other Available Help at Discharge: Family Type of Home: House Home Access: Level entry Home Layout: Multi-level, Able to live on main level with bedroom/bathroom Alternate Level Stairs-Number of Steps: flight Bathroom Shower/Tub: Chiropodist: Standard Home Equipment: Cane - single point, Bedside commode Additional Comments: Daughter lives about 25 min away, has 2 grown sons who can help  as well. Pt and his wife live in an old farmhouse that is heated by a wood stove.  Lives With: Spouse   Functional History: Prior Function Level of Independence: Needs assistance Gait / Transfers Assistance Needed: ambulated with RW ADL's / Homemaking Assistance Needed: assist from wife for bathing and dressing  Comments: Daughter's biggest concern is getting Dad to eat so that he can get better -improve energy and strength   Functional Status:  Mobility: Bed Mobility Overal bed mobility: Needs Assistance Bed Mobility: Supine to Sit Supine to sit: Min assist Sit to supine: Mod assist General bed mobility comments: minA to bring trunk to upright, pt able to move LE across bed to floor Transfers Overall transfer level: Needs assistance Equipment used: Rolling walker (2 wheeled) Transfer via Lift Equipment: Stedy Transfers: Sit to/from Stand Sit to Stand: From elevated surface, +2 physical assistance, Min assist, Min guard General transfer comment: minAx2 for initial power up to [place] from bed, once on Stedy pt able to perform 3 more x sit>stands, before requesting to use BSC. After toileting pt requires minA for powerup from lower surface, min guard to lower to recliner Ambulation/Gait Ambulation/Gait assistance: Mod assist, +2 physical assistance Gait Distance (Feet): 3 Feet Assistive device: Rolling walker (2 wheeled) Gait Pattern/deviations: Step-to pattern, Shuffle, Trunk flexed General Gait Details: modAx2 for steadying, pt only able to take lateral steps towards HoB with continued support posteriorly on LE from bed Gait velocity: slowed Gait velocity interpretation: <1.31 ft/sec, indicative of household ambulator   ADL: ADL Overall ADL's : Needs assistance/impaired Eating/Feeding: Maximal assistance Grooming: Moderate assistance, Sitting Grooming Details (indicate cue type and reason): requires supported sitting, or BUE support for grooming tasks Upper Body Bathing:  Moderate assistance Lower Body Bathing: Maximal assistance Upper Body Dressing : Moderate assistance Lower Body Dressing: Maximal assistance Lower Body Dressing Details (indicate cue type and reason): Pt unable to take off socks at this time Toilet Transfer: Maximal assistance, +2 for safety/equipment, [facility] Toilet Transfer Details (indicate cue type and reason): with stedy Toileting- Clothing Manipulation and Hygiene: Total assistance, Sit to/from stand Toileting - Clothing Manipulation Details (indicate cue type and reason): from stedy Tub/ Shower Transfer: Maximal assistance, +2 for physical assistance, +2 for safety/equipment Functional mobility during ADLs: Maximal assistance, +2 for physical assistance, +2 for safety/equipment, Rolling walker, Cueing for safety, Cueing for sequencing General ADL Comments: Pt demonstrates deficits in cognition, balance, also demonstrates generalized weakness and deconditioning impacting ability to perform ADL   Cognition: Cognition Overall Cognitive Status: Impaired/Different from baseline Arousal/Alertness: Awake/alert Orientation Level: Oriented to person, Oriented to place, Oriented to time, Disoriented to situation Attention: Selective Selective Attention: Impaired Selective Attention Impairment: Verbal basic Memory: Impaired Memory Impairment: Storage deficit, Retrieval deficit, Decreased recall of new information Awareness: Impaired Awareness Impairment: Emergent impairment Problem Solving: Impaired Problem Solving Impairment: Verbal basic Behaviors: Impulsive Safety/Judgment: Impaired Cognition Arousal/Alertness: Awake/alert Behavior During Therapy: Flat affect Overall Cognitive Status: Impaired/Different from baseline Area of Impairment: Memory, Following commands, Safety/judgement, Awareness, Problem solving Memory: Decreased short-term memory Following Commands: Follows multi-step commands inconsistently, Follows one step commands with  increased time Safety/Judgement: Decreased awareness of deficits Awareness: Intellectual Problem Solving: Slow processing, Decreased initiation, Difficulty sequencing, Requires verbal cues, Requires tactile cues General Comments: Pt seems to have improved cognition from previous sessions with SLP and other medical staff, but remains confused, able to follow simple commands with increased time.      Blood pressure 119/66, pulse 80, temperature 97.8 F (36.6 C), temperature source Oral, resp. rate 18, height 5\' 10"  (1.778 m), weight 52.5 kg, SpO2 99 %. Physical Exam  Nursing note and vitals reviewed. Constitutional: He appears well-developed. No distress. Nasal cannula in place.  Thin, frail appearing.   HENT:  Head: Normocephalic and atraumatic.  Eyes: Pupils are equal, round, and reactive to light.  EOM are normal.  Neck: Normal range of motion. No thyromegaly present.  Cardiovascular: Normal rate. Exam reveals no friction rub.  Respiratory: Effort normal. No respiratory distress.  [place]  Neurological: He is alert.  More oriented today but does not recall events leading to hospitalization. Able to follow simple motor commands without difficulty. Perseverating on calling wife, wants her to take him home. UE 4/5 prox to distal. LE: 3/5 HF, KE and 4/5 distally. Senses pain and light touch in all 4's.   Skin: Skin is warm. He is not diaphoretic.  Psychiatric:  Distracted, anxious at times      Lab Results Last 48 Hours        Results for orders placed or performed during the hospital encounter of 03/30/19 (from the past 48 hour(s))  Glucose, capillary     Status: Abnormal    Collection Time: 04/08/19 12:17 PM  Result Value Ref Range    Glucose-Capillary 139 (H) 70 - 99 mg/dL  Glucose, capillary     Status: Abnormal    Collection Time: 04/08/19  5:11 PM  Result Value Ref Range    Glucose-Capillary 112 (H) 70 - 99 mg/dL  Glucose, capillary     Status: None    Collection Time: 04/09/19  12:21 AM  Result Value Ref Range    Glucose-Capillary 70 70 - 99 mg/dL    Comment 1 Notify RN      Comment 2 Document in Chart    CBC     Status: Abnormal    Collection Time: 04/09/19  4:43 AM  Result Value Ref Range    WBC 11.6 (H) 4.0 - 10.5 K/uL    RBC 4.61 4.22 - 5.81 MIL/uL    Hemoglobin 13.4 13.0 - 17.0 g/dL    HCT 40.2 39.0 - 52.0 %    MCV 87.2 80.0 - 100.0 fL    MCH 29.1 26.0 - 34.0 pg    MCHC 33.3 30.0 - 36.0 g/dL    RDW 13.3 11.5 - 15.5 %    Platelets 254 150 - 400 K/uL    nRBC 0.0 0.0 - 0.2 %      Comment: Performed at [facility], [place] [address] [place], [place] [address]  Comprehensive metabolic panel     Status: Abnormal    Collection Time: 04/09/19  4:43 AM  Result Value Ref Range    Sodium 136 135 - 145 mmol/L    Potassium 3.3 (L) 3.5 - 5.1 mmol/L      Comment: NO VISIBLE HEMOLYSIS    Chloride 102 98 - 111 mmol/L    CO2 23 22 - 32 mmol/L    Glucose, Bld 82 70 - 99 mg/dL    BUN 11 8 - 23 mg/dL    Creatinine, Ser 0.58 (L) 0.61 - 1.24 mg/dL    Calcium 7.9 (L) 8.9 - 10.3 mg/dL    Total Protein 5.4 (L) 6.5 - 8.1 g/dL    Albumin 2.5 (L) 3.5 - 5.0 g/dL    AST 61 (H) 15 - 41 U/L    ALT 247 (H) 0 - 44 U/L    Alkaline Phosphatase 63 38 - 126 U/L    Total Bilirubin 0.8 0.3 - 1.2 mg/dL    GFR calc non Af Amer >60 >60 mL/min    GFR calc Af Amer >60 >60 mL/min    Anion gap 11 5 - 15      Comment: Performed at [facility], [place]  [address] [place], [place] [address]  Magnesium     Status: Abnormal    Collection Time: 04/09/19  4:43 AM  Result Value Ref Range    Magnesium 1.6 (L) 1.7 - 2.4 mg/dL      Comment: Performed at [facility] [address], [place], [place] [address]  Phosphorus     Status: Abnormal    Collection Time: 04/09/19  4:43 AM  Result Value Ref Range    Phosphorus 2.2 (L) 2.5 - 4.6 mg/dL      Comment: Performed at [facility] [address], [place], [place] [address]  Glucose, capillary     Status: None     Collection Time: 04/09/19  5:54 AM  Result Value Ref Range    Glucose-Capillary 76 70 - 99 mg/dL    Comment 1 Notify RN      Comment 2 Document in Chart    Glucose, capillary     Status: None    Collection Time: 04/09/19 11:53 AM  Result Value Ref Range    Glucose-Capillary 98 70 - 99 mg/dL  Glucose, capillary     Status: None    Collection Time: 04/09/19  4:26 PM  Result Value Ref Range    Glucose-Capillary 82 70 - 99 mg/dL  Glucose, capillary     Status: None    Collection Time: 04/09/19 10:18 PM  Result Value Ref Range    Glucose-Capillary 77 70 - 99 mg/dL    Comment 1 Notify RN      Comment 2 Document in Chart    Glucose, capillary     Status: None    Collection Time: 04/10/19 12:04 AM  Result Value Ref Range    Glucose-Capillary 79 70 - 99 mg/dL    Comment 1 Notify RN      Comment 2 Document in Chart    Glucose, capillary     Status: None    Collection Time: 04/10/19  4:21 AM  Result Value Ref Range    Glucose-Capillary 76 70 - 99 mg/dL    Comment 1 Notify RN      Comment 2 Document in Chart    Comprehensive metabolic panel     Status: Abnormal    Collection Time: 04/10/19  5:04 AM  Result Value Ref Range    Sodium 136 135 - 145 mmol/L    Potassium 3.9 3.5 - 5.1 mmol/L    Chloride 103 98 - 111 mmol/L    CO2 29 22 - 32 mmol/L    Glucose, Bld 85 70 - 99 mg/dL    BUN 14 8 - 23 mg/dL    Creatinine, Ser 0.58 (L) 0.61 - 1.24 mg/dL    Calcium 8.1 (L) 8.9 - 10.3 mg/dL    Total Protein 5.1 (L) 6.5 - 8.1 g/dL    Albumin 2.5 (L) 3.5 - 5.0 g/dL    AST 47 (H) 15 - 41 U/L    ALT 199 (H) 0 - 44 U/L    Alkaline Phosphatase 60 38 - 126 U/L    Total Bilirubin 1.0 0.3 - 1.2 mg/dL    GFR calc non Af Amer >60 >60 mL/min    GFR calc Af Amer >60 >60 mL/min    Anion gap 4 (L) 5 - 15      Comment: Performed at [facility], [address] [place], [place] [address]  Magnesium     Status: None    Collection Time: 04/10/19  5:04 AM  Result Value Ref Range    Magnesium 1.8 1.7  - 2.4 mg/dL      Comment: Performed at [facility] [address] [place], [place] [address]  Glucose, capillary     Status: None    Collection Time: 04/10/19  6:06 AM  Result Value Ref Range    Glucose-Capillary 75 70 - 99 mg/dL    Comment 1 Notify RN      Comment 2 Document in Chart         Imaging Results (Last 48 hours)  Dg Chest 2 View   Result Date: 04/09/2019 CLINICAL DATA:  Respiratory failure.  Possible pneumonia. EXAM: CHEST - 2 VIEW COMPARISON:  04/02/2019 and 01/06/2019 FINDINGS: Patient slightly rotated to the right. Lungs are adequately inflated and demonstrate evidence of emphysematous disease with left upper lobe scarring. No focal lobar consolidation. Small amount of bilateral pleural fluid seen posteriorly on the lateral film. No pneumothorax. Cardiomediastinal silhouette is within normal. Remainder of the exam is unchanged. IMPRESSION: Small bilateral pleural effusions. Emphysematous disease. Electronically Signed   By: Marin Olp M.D.   On: 04/09/2019 09:08             Medical Problem List and Plan: 1.  Functional and mobility deficits secondary to debility and encephalopathy after respiratory failure             -admit to inpatient rehab 2.  Antithrombotics: -DVT/anticoagulation:  Pharmaceutical: Other (comment)--Eliquis             -antiplatelet therapy: Plavix (no ASA) 3. Pain Management: tylenol prn as well as local measures prn.  4. Mood: LCSW to follow for evaluation and support.              -antipsychotic agents: N/A 5. Neuropsych: This patient is not capable of making decisions on his own behalf. 6. Skin/Wound Care: routine pressure relief measures.  7. Fluids/Electrolytes/Nutrition: Monitor I/O. Check BMET and Mg levels Monday.  8. CAD/A Flutter: Continue metoprolol 12.5 mg bid with Apixaban and Plavix.  9. COPD- oxygen dependent: Respiratory status improving. Continue Incruse and Ellipta daily with albuterol prn.  10 Abnormal LFTs:  Improving--recheck on Monday.  11. Leucocytosis: Resolving. Continue to monitor for fevers or other signs of infection.  12. Malnutrition: Will add prostat. Recheck Mg, Phos levels on Monday.              Encourage PO, ?appetite stimulant      Post Admission Physician Evaluation: 1. Functional deficits secondary  to debility and encephalopathy. 2. Patient is admitted to receive collaborative, interdisciplinary care between the physiatrist, rehab nursing staff, and therapy team. 3. Patient's level of medical complexity and substantial therapy needs in context of that medical necessity cannot be provided at a lesser intensity of care such as a SNF. 4. Patient has experienced substantial functional loss from his/her baseline which was documented above under the "Functional History" and "Functional Status" headings.  Judging by the patient's diagnosis, physical exam, and functional history, the patient has potential for functional progress which will result in measurable gains while on inpatient rehab.  These gains will be of substantial and practical use upon discharge  in facilitating mobility and self-care at the household level. 5. Physiatrist will provide 24 hour management of medical needs as well as oversight of the therapy plan/treatment and provide guidance as appropriate regarding the interaction of the two. 6. The Preadmission Screening has been reviewed and patient status is unchanged unless otherwise stated above. 7. 24  hour rehab nursing will assist with bladder management, bowel management, safety, skin/wound care, disease management, medication administration, pain management and patient education  and help integrate therapy concepts, techniques,education, etc. 8. PT will assess and treat for/with: Lower extremity strength, range of motion, stamina, balance, functional mobility, safety, adaptive techniques and equipment, NMR, community reentry, activity tolerance.   Goals are: supervision  to mod I. 9. OT will assess and treat for/with: ADL's, functional mobility, safety, upper extremity strength, adaptive techniques and equipment, NMR, family ed.   Goals are: supervision to mod I. Therapy may proceed with showering this patient. 10. SLP will assess and treat for/with: cognition, communication.  Goals are: mod I to supervision. 11. Case Management and Social Worker will assess and treat for psychological issues and discharge planning. 12. Team conference will be held weekly to assess progress toward goals and to determine barriers to discharge. 13. Patient will receive at least 3 hours of therapy per day at least 5 days per week. 14. ELOS: 11-16 days       15. Prognosis:  excellent   I have personally performed a face to face diagnostic evaluation of this patient and formulated the key components of the plan.  Additionally, I have personally reviewed laboratory data, imaging studies, as well as relevant notes and concur with the physician assistant's documentation above.  Meredith Staggers, MD, [facility]     Bary Leriche, PA-C 04/10/2019

## 2019-04-12 MED ORDER — MEGESTROL ACETATE 400 MG/10ML PO SUSP
400.0000 mg | Freq: Two times a day (BID) | ORAL | Status: DC
  Administered 2019-04-12 – 2019-04-23 (×23): 400 mg via ORAL
  Filled 2019-04-12 (×23): qty 10

## 2019-04-12 NOTE — Progress Notes (Signed)
[facility] PHYSICAL MEDICINE & REHABILITATION PROGRESS NOTE   Subjective/Complaints: Slept fairly well. No sob beyond baseline. Not much appetite  ROS: Patient denies fever, rash, sore throat, blurred vision, nausea, vomiting, diarrhea, cough,   chest pain, joint or back pain, headache, or mood change.    Objective:   No results found. No results for input(s): WBC, HGB, HCT, PLT in the last 72 hours. Recent Labs    04/10/19 0504  NA 136  K 3.9  CL 103  CO2 29  GLUCOSE 85  BUN 14  CREATININE 0.58*  CALCIUM 8.1*    Intake/Output Summary (Last 24 hours) at 04/12/2019 1103 Last data filed at 04/12/2019 0800 Gross per 24 hour  Intake 120 ml  Output 600 ml  Net -480 ml     Physical Exam: Vital Signs Blood pressure (!) 130/52, pulse 70, temperature 98.3 F (36.8 C), temperature source Oral, resp. rate 14, height 5\' 10"  (1.778 m), weight 51.3 kg, SpO2 97 %. Constitutional: No distress . Vital signs reviewed. HEENT: EOMI, oral membranes moist Neck: supple Cardiovascular: RRR without murmur. No JVD    Respiratory: CTA Bilaterally without wheezes or rales. Normal effort    GI: BS +, non-tender, non-distended  Neurological: He is alert. Oriented to person, place, reason. improving awareness. UE 4/5 prox to distal. LE: 3/5 HF, KE and 4/5 distally. Senses pain and light touch in all 4's. Skin: Skin iswarm. He isnot diaphoretic.  Psychiatric: Less distracted today. No anxiety or restlessness    Assessment/Plan: 1. Functional deficits secondary to debility/encephalopathy which require 3+ hours per day of interdisciplinary therapy in a comprehensive inpatient rehab setting.  Physiatrist is providing close team supervision and 24 hour management of active medical problems listed below.  Physiatrist and rehab team continue to assess barriers to discharge/monitor patient progress toward functional and medical goals  Care Tool:  Bathing    Body parts bathed by patient:  Chest, Abdomen   Body parts bathed by helper: Right arm, Left arm, Front perineal area, Buttocks, Right upper leg, Left upper leg, Right lower leg, Left lower leg, Face     Bathing assist Assist Level: Maximal Assistance - Patient 24 - 49%     Upper Body Dressing/Undressing Upper body dressing   What is the patient wearing?: Pull over shirt    Upper body assist Assist Level: Moderate Assistance - Patient 50 - 74%    Lower Body Dressing/Undressing Lower body dressing      What is the patient wearing?: Pants, Incontinence brief     Lower body assist Assist for lower body dressing: Maximal Assistance - Patient 25 - 49%     Toileting Toileting    Toileting assist Assist for toileting: Maximal Assistance - Patient 25 - 49%     Transfers Chair/bed transfer  Transfers assist     Chair/bed transfer assist level: Moderate Assistance - Patient 50 - 74%     Locomotion Ambulation   Ambulation assist      Assist level: Minimal Assistance - Patient > 75% Assistive device: Walker-rolling Max distance: 5   Walk 10 feet activity   Assist           Walk 50 feet activity   Assist           Walk 150 feet activity   Assist           Walk 10 feet on uneven surface  activity   Assist  Wheelchair     Assist Will patient use wheelchair at discharge?: Yes Type of Wheelchair: Manual    Wheelchair assist level: Dependent - Patient 0%      Wheelchair 50 feet with 2 turns activity    Assist    Wheelchair 50 feet with 2 turns activity did not occur: Safety/medical concerns(SOB)       Wheelchair 150 feet activity     Assist Wheelchair 150 feet activity did not occur: Safety/medical concerns(SOB)        Medical Problem List and Plan: 1.Functional and mobility deficitssecondary to debility and encephalopathy after respiratory failure Patient is beginning CIR therapies today including PT and OT and  SLP 2. Antithrombotics: -DVT/anticoagulation:Pharmaceutical:Other (comment)--Eliquis -antiplatelet therapy: Plavix (no ASA) 3. Pain Management:tylenol prn as well as local measures prn. 4. Mood:LCSW to follow for evaluation and support. -antipsychotic agents: N/A 5. Neuropsych: This patientis notcapable of making decisions onhisown behalf. 6. Skin/Wound Care:routine pressure relief measures. 7. Fluids/Electrolytes/Nutrition: intake has been poor.   -he is emaciated  -discussed improved intake with him this morning, and he ate 60% breakfast. Otherwise his intake has been next to nothing  8. CAD/A Flutter: Continue metoprolol 12.5 mg bid with Apixaban and Plavix.  9. COPD- oxygen dependent: Respiratory status improving. Continue Incruse and Ellipta daily with albuterol prn.   -on home O2 10 Abnormal LFTs: Improving--recheck on Monday.  11. Leukocytosis: Resolving.     -afebrile. No active signs of infection 12. Moderae protein Malnutrition: continue prostat. Recheck Mg, Phos levels on Monday. Encourage PO,  - megace trial    LOS: 1 days A FACE TO FACE EVALUATION WAS PERFORMED  Meredith Staggers 04/12/2019, 11:03 AM

## 2019-04-13 ENCOUNTER — Inpatient Hospital Stay (HOSPITAL_COMMUNITY): Payer: Medicare Other

## 2019-04-13 ENCOUNTER — Inpatient Hospital Stay (HOSPITAL_COMMUNITY): Payer: Medicare Other | Admitting: Physical Therapy

## 2019-04-13 ENCOUNTER — Inpatient Hospital Stay (HOSPITAL_COMMUNITY): Payer: Medicare Other | Admitting: Speech Pathology

## 2019-04-13 LAB — CBC WITH DIFFERENTIAL/PLATELET
Abs Immature Granulocytes: 0.17 10*3/uL — ABNORMAL HIGH (ref 0.00–0.07)
Basophils Absolute: 0 10*3/uL (ref 0.0–0.1)
Basophils Relative: 0 %
Eosinophils Absolute: 0.1 10*3/uL (ref 0.0–0.5)
Eosinophils Relative: 1 %
HCT: 36.5 % — ABNORMAL LOW (ref 39.0–52.0)
Hemoglobin: 12 g/dL — ABNORMAL LOW (ref 13.0–17.0)
Immature Granulocytes: 1 %
Lymphocytes Relative: 11 %
Lymphs Abs: 1.4 10*3/uL (ref 0.7–4.0)
MCH: 29 pg (ref 26.0–34.0)
MCHC: 32.9 g/dL (ref 30.0–36.0)
MCV: 88.2 fL (ref 80.0–100.0)
Monocytes Absolute: 1.1 10*3/uL — ABNORMAL HIGH (ref 0.1–1.0)
Monocytes Relative: 8 %
Neutro Abs: 10.2 10*3/uL — ABNORMAL HIGH (ref 1.7–7.7)
Neutrophils Relative %: 79 %
Platelets: 244 10*3/uL (ref 150–400)
RBC: 4.14 MIL/uL — ABNORMAL LOW (ref 4.22–5.81)
RDW: 13.7 % (ref 11.5–15.5)
WBC: 12.9 10*3/uL — ABNORMAL HIGH (ref 4.0–10.5)
nRBC: 0 % (ref 0.0–0.2)

## 2019-04-13 LAB — COMPREHENSIVE METABOLIC PANEL
ALT: 80 U/L — ABNORMAL HIGH (ref 0–44)
AST: 19 U/L (ref 15–41)
Albumin: 2.2 g/dL — ABNORMAL LOW (ref 3.5–5.0)
Alkaline Phosphatase: 58 U/L (ref 38–126)
Anion gap: 5 (ref 5–15)
BUN: 20 mg/dL (ref 8–23)
CO2: 28 mmol/L (ref 22–32)
Calcium: 7.8 mg/dL — ABNORMAL LOW (ref 8.9–10.3)
Chloride: 105 mmol/L (ref 98–111)
Creatinine, Ser: 0.57 mg/dL — ABNORMAL LOW (ref 0.61–1.24)
GFR calc Af Amer: >60 mL/min (ref >60)
GFR calc non Af Amer: >60 mL/min (ref >60)
Glucose, Bld: 99 mg/dL (ref 70–99)
Potassium: 3.4 mmol/L — ABNORMAL LOW (ref 3.5–5.1)
Sodium: 138 mmol/L (ref 135–145)
Total Bilirubin: 0.8 mg/dL (ref 0.3–1.2)
Total Protein: 4.9 g/dL — ABNORMAL LOW (ref 6.5–8.1)

## 2019-04-13 LAB — PHOSPHORUS: Phosphorus: 3.1 mg/dL (ref 2.5–4.6)

## 2019-04-13 LAB — AMMONIA: Ammonia: 21 umol/L (ref 9–35)

## 2019-04-13 LAB — MAGNESIUM: Magnesium: 1.7 mg/dL (ref 1.7–2.4)

## 2019-04-13 MED ORDER — MAGNESIUM GLUCONATE 500 MG PO TABS
500.0000 mg | ORAL_TABLET | Freq: Every day | ORAL | Status: DC
  Administered 2019-04-13 – 2019-04-23 (×11): 500 mg via ORAL
  Filled 2019-04-13 (×11): qty 1

## 2019-04-13 MED ORDER — POTASSIUM CHLORIDE CRYS ER 20 MEQ PO TBCR
20.0000 meq | EXTENDED_RELEASE_TABLET | Freq: Every day | ORAL | Status: DC
  Administered 2019-04-13 – 2019-04-19 (×7): 20 meq via ORAL
  Filled 2019-04-13 (×8): qty 1

## 2019-04-13 NOTE — Progress Notes (Signed)
[facility] PHYSICAL MEDICINE & REHABILITATION PROGRESS NOTE   Subjective/Complaints: Had a good night. No new problems this morning  ROS: Patient denies fever, rash, sore throat, blurred vision, nausea, vomiting, diarrhea, cough,   chest pain, joint or back pain, headache, or mood change.    Objective:   No results found. Recent Labs    04/13/19 0624  WBC 12.9*  HGB 12.0*  HCT 36.5*  PLT 244   Recent Labs    04/13/19 0624  NA 138  K 3.4*  CL 105  CO2 28  GLUCOSE 99  BUN 20  CREATININE 0.57*  CALCIUM 7.8*    Intake/Output Summary (Last 24 hours) at 04/13/2019 1015 Last data filed at 04/13/2019 0743 Gross per 24 hour  Intake 238 ml  Output -  Net 238 ml     Physical Exam: Vital Signs Blood pressure (!) 120/53, pulse 72, temperature 97.6 F (36.4 C), temperature source Oral, resp. rate 14, height 5\' 10"  (1.778 m), weight 52.2 kg, SpO2 97 %. Constitutional: No distress . Vital signs reviewed. HEENT: EOMI, oral membranes moist Neck: supple Cardiovascular: RRR without murmur. No JVD    Respiratory: CTA Bilaterally without wheezes or rales. Normal effort,     GI: BS +, non-tender, non-distended  Neurological: He is alert. Oriented to person, place, reason. Improving insight and awareness. UE 4/5 prox to distal. LE: 3/5 HF, KE and 4/5 distally. Senses pain and light touch in all 4's. Skin: Skin iswarm. He isnot diaphoretic.  Psychiatric: Pleasant, a little flat    Assessment/Plan: 1. Functional deficits secondary to debility/encephalopathy which require 3+ hours per day of interdisciplinary therapy in a comprehensive inpatient rehab setting.  Physiatrist is providing close team supervision and 24 hour management of active medical problems listed below.  Physiatrist and rehab team continue to assess barriers to discharge/monitor patient progress toward functional and medical goals  Care Tool:  Bathing    Body parts bathed by patient: Chest, Abdomen    Body parts bathed by helper: Right arm, Left arm, Front perineal area, Buttocks, Right upper leg, Left upper leg, Right lower leg, Left lower leg, Face     Bathing assist Assist Level: Maximal Assistance - Patient 24 - 49%     Upper Body Dressing/Undressing Upper body dressing   What is the patient wearing?: Pull over shirt    Upper body assist Assist Level: Moderate Assistance - Patient 50 - 74%    Lower Body Dressing/Undressing Lower body dressing    Lower body dressing activity did not occur: N/A What is the patient wearing?: Pants, Incontinence brief     Lower body assist Assist for lower body dressing: Maximal Assistance - Patient 25 - 49%     Toileting Toileting    Toileting assist Assist for toileting: Maximal Assistance - Patient 25 - 49%     Transfers Chair/bed transfer  Transfers assist     Chair/bed transfer assist level: Moderate Assistance - Patient 50 - 74%     Locomotion Ambulation   Ambulation assist      Assist level: Minimal Assistance - Patient > 75% Assistive device: Walker-rolling Max distance: 5   Walk 10 feet activity   Assist           Walk 50 feet activity   Assist           Walk 150 feet activity   Assist           Walk 10 feet on uneven surface  activity  Assist           Wheelchair     Assist Will patient use wheelchair at discharge?: Yes Type of Wheelchair: Manual    Wheelchair assist level: Dependent - Patient 0%      Wheelchair 50 feet with 2 turns activity    Assist    Wheelchair 50 feet with 2 turns activity did not occur: Safety/medical concerns(SOB)       Wheelchair 150 feet activity     Assist Wheelchair 150 feet activity did not occur: Safety/medical concerns(SOB)        Medical Problem List and Plan: 1.Functional and mobility deficitssecondary to debility and encephalopathy after respiratory failure -Continue CIR therapies including PT,  OT, and SLP  2. Antithrombotics: -DVT/anticoagulation:Pharmaceutical:Other (comment)--Eliquis -antiplatelet therapy: Plavix (no ASA) 3. Pain Management:tylenol prn as well as local measures prn. 4. Mood:LCSW to follow for evaluation and support. -antipsychotic agents: N/A 5. Neuropsych: This patientis notcapable of making decisions onhisown behalf. 6. Skin/Wound Care:routine pressure relief measures. 7. Fluids/Electrolytes/Nutrition:   -he is emaciated  -I personally reviewed the patient's labs today.    -replete K+  -replete Mg++ borderline 8. CAD/A Flutter: Continue metoprolol 12.5 mg bid with Apixaban and Plavix.  9. COPD- oxygen dependent: Respiratory status improving. Continue Incruse and Ellipta daily with albuterol prn.   -on home O2 10 Abnormal LFTs: Improving--recheck on Monday.  11. Leukocytosis:12.9k 4/20  -afebrile. No active signs of infection  -recheck this week 12. Moderae protein Malnutrition: continue prostat.  . Encourage PO,  - megace trial  -demonstrated improved intake yesterday---continue to monitor    LOS: 2 days A FACE TO FACE EVALUATION WAS PERFORMED  Meredith Staggers 04/13/2019, 10:15 AM

## 2019-04-13 NOTE — Progress Notes (Signed)
Inpatient Rehabilitation  Patient information reviewed and entered into eRehab system by Maelle Sheaffer M. Barney Russomanno, M.A., CCC/SLP, PPS Coordinator.  Information including medical coding, functional ability and quality indicators will be reviewed and updated through discharge.    

## 2019-04-13 NOTE — Progress Notes (Signed)
Speech Language Pathology Daily Session Note  Patient Details  Name: Joshua Whitaker MRN: [id number] Date of Birth: 02-14-1941  Today's Date: 04/13/2019 SLP Individual Time: 0800-0840 SLP Individual Time Calculation (min): 40 min  Short Term Goals: Week 1: SLP Short Term Goal 1 (Week 1): Patient will consume current diet of Dys. 1 textures with nectar thick liquids without overt s/sx of aspiration and supervision verbal cues for use of swallowing strategies.  SLP Short Term Goal 2 (Week 1): Patient will complete pharyngeal strengthening exercises with mod A. SLP Short Term Goal 3 (Week 1): Patient will demonstrate functional mastication and oral clearance of dysphagia 2 textures over 2 sessions prior to upgrade with supervision verbal cues. SLP Short Term Goal 4 (Week 1): Patient will consume trials of thin liquids with no overt s/sx of aspriation over 2 sessions with supervision verbal cues to assess readiness for repeat MBS.  Skilled Therapeutic Interventions: Skilled treatment session focused on dysphagia goals. SLP facilitated session by educating patient in regards to pharyngeal strengthening exercises. Patient required Mod-Max A verbal and visual cues to perform the exercises accurately and Min-Mod A verbal cues for recall of exercises after a 10 minute delay. Therefore, a basic visual aid was created to maximize recall and carryover. RN present and administered medications crushed in puree with nectar-thick liquids without overt s/s of aspiration noted.  Trials were not attempted this session due to h/o silent aspiration and just receiving medications. Recommend patient continue current diet. Patient also noted to be lethargic and required Mod verbal cues for arousal and participation. Patient left upright in bed with alarm on and all needs within reach. Continue with current plan of care.      Pain Pain Assessment Pain Scale: 0-10 Pain Score: 0-No pain  Therapy/Group: Individual  Therapy  Camiya Vinal 04/13/2019, 12:26 PM

## 2019-04-13 NOTE — Progress Notes (Signed)
Physical Therapy Session Note  Patient Details  Name: Joshua Whitaker MRN: [id number] Date of Birth: Jan 10, 1941  Today's Date: 04/13/2019 PT Individual Time: 4481-8563 PT Individual Time Calculation (min): 24 min   Short Term Goals: Week 1:  PT Short Term Goal 1 (Week 1): pt will move supine> sit with min assist PT Short Term Goal 2 (Week 1): pt will sit> stand with min assist PT Short Term Goal 3 (Week 1): pt will propel w/c x 50' with supervision PT Short Term Goal 4 (Week 1): pt will perform gait with LRAD x 25'  Skilled Therapeutic Interventions/Progress Updates:  Pt received in bed requiring extensive encouragement to participate in therapy session. Pt continues to report "let's not do it today" when asked to transfer to sitting EOB. Pt agreeable to bed level exercises & performed BLE heel slides, short arc quads, hip adduction pillow squeezes, and bridging (unable to clear entire buttocks from bed 2/2 weakness) for BLE strengthening with instructional cuing for technique and verbal/tactile cuing for sustained attention to task as pt frequently closing eyes or with limited engagement. Pt left in bed with alarm set & all needs at hand. Pt missed 55 skilled PT treatment time 2/2 fatigue.  Pt on 2L/min supplemental oxygen via nasal cannula requiring cuing to close mouth to breathe through nose during session. SpO2 = 100%, HR = 81 bpm.  Therapy Documentation Precautions:  Precautions Precautions: Fall Restrictions Weight Bearing Restrictions: No  General: PT Amount of Missed Time (min): 51 Minutes PT Missed Treatment Reason: Patient fatigue   Pain: Denies c/o pain.  Therapy/Group: Individual Therapy  Waunita Schooner 04/13/2019, 2:05 PM

## 2019-04-13 NOTE — Progress Notes (Signed)
Physical Therapy Session Note  Patient Details  Name: Joshua Whitaker MRN: [id number] Date of Birth: 1941/05/21  Today's Date: 04/13/2019 PT Individual Time: 1150-1240 PT Individual Time Calculation (min): 50 min   Short Term Goals: Week 1:  PT Short Term Goal 1 (Week 1): pt will move supine> sit with min assist PT Short Term Goal 2 (Week 1): pt will sit> stand with min assist PT Short Term Goal 3 (Week 1): pt will propel w/c x 50' with supervision PT Short Term Goal 4 (Week 1): pt will perform gait with LRAD x 25'  Skilled Therapeutic Interventions/Progress Updates:    Patient in supine on 2L O2 via [place].  Cues, encouragement and increased time for pt to move towards EOB, then mod A for legs off bed and to lift trunk.  Patient seated EOB x 20 min prior to initiating sit to stand to get to chair, but finally moved with min A to squat pivot to w/c after encouragement and reasoning why best to get up and not lay back down.  Patient assisted in w/c to ortho gym.  Performed sit to stand to RW min A with cues for hand placement (again with increased time), ambulate 5' to car with RW and min A, then performed car transfer with mod A.  Patient ambulated back to w/c and assisted in w/c to room.  Patient wanting to get to bed, but encouraged OOB at least up in recliner.  Stood min A and ambulated 5' to recliner with RW.  Left in chair with alarm belt and NT in room to assist with lunch tray. Note throughout session, pt on 2L portable O2 and spot check SpO2 100% with HR 89.  Therapy Documentation Precautions:  Precautions Precautions: Fall Restrictions Weight Bearing Restrictions: No Pain: Pain Assessment Pain Scale: 0-10 Pain Score: 0-No pain    Therapy/Group: Individual Therapy  Reginia Naas  Magda Kiel, PT 04/13/2019, 1:31 PM

## 2019-04-13 NOTE — Progress Notes (Signed)
Social Work  Social Work Assessment and Plan  Patient Details  Name: Joshua Whitaker MRN: [id number] Date of Birth: January 24, 1941  Today's Date: 04/13/2019  Problem List:  Patient Active Problem List   Diagnosis Date Noted  . Debility 04/11/2019  . Protein-calorie malnutrition, severe 04/10/2019  . Hypokalemia   . Weakness generalized   . Adult failure to thrive   . Pericardial effusion 04/02/2019  . Palliative care by specialist   . DNR (do not resuscitate) discussion   . Lobar pneumonia ([place]) 04/01/2019  . Acute on chronic respiratory failure ([place]) 04/01/2019  . Atrial flutter ([place]) 03/30/2019  . Acute on chronic respiratory failure with hypoxia ([place]) 03/16/2019  . Alcohol abuse 03/16/2019  . Tobacco abuse 03/16/2019  . Medically noncompliant 03/16/2019  . Pressure injury of skin 03/14/2019  . Coronary artery calcification 03/14/2019  . Aortic atherosclerosis ([place]) 03/14/2019  . Chronic obstructive pulmonary disease ([place])   . Hyponatremia   . ST elevation   . Transaminitis   . Nodule of right lung 10/12/2015  . H/O prostate cancer 04/21/2014  . Essential hypertension 03/20/2011  . Hyperlipidemia 03/20/2011  . Panlobular emphysema ([place]) 03/20/2011   Past Medical History:  Past Medical History:  Diagnosis Date  . Alcohol abuse   . Allergy   . Anxiety   . Asthma   . COPD (chronic obstructive pulmonary disease) ([place])   . Coronary artery calcification seen on CT scan   . Depression   . Hyponatremia   . Prostate cancer [facility])    Past Surgical History:  Past Surgical History:  Procedure Laterality Date  . EYE SURGERY    . PROSTATE SURGERY    . SPINE SURGERY     Social History:  reports that he has been smoking cigarettes. He has been smoking about 1.00 pack per day. He has never used smokeless tobacco. He reports current alcohol use. He reports that he does not use drugs.  Family / Support Systems Marital Status: Married Patient Roles: Spouse,  Parent Spouse/Significant Other: wife, Roylee Chaffin @ [phone] Children: daughter, Ivonne [facility]) @ [phone];  son, Sanav Remer @ [phone]; daughter, Jorge Ny (lives "close by" to pt) @ [phone];  and one daughter, Lynelle Smoke, in a local SNF Anticipated Caregiver: wife and daughter, [name] Ability/Limitations of Caregiver: None Caregiver Availability: 24/7 Family Dynamics: Wife very concerned and asking questions about pt's care needs at home as she commits to providing "anything he needs..."  She notes it is "difficult" not being able to visit and that she "worries".  Pt speack very affectionately about his family.  Social History Preferred language: English Religion: Baptist Cultural Background: NA Read: Yes Write: Yes Employment Status: Retired Public relations account executive Issues: None Guardian/Conservator: None - per MD, pt is not capable of making decisions on his own behalf - defer to spouse.   Abuse/Neglect Abuse/Neglect Assessment Can Be Completed: Yes Physical Abuse: Denies Verbal Abuse: Denies Sexual Abuse: Denies Exploitation of patient/patient's resources: Denies Self-Neglect: Denies  Emotional Status Pt's affect, behavior and adjustment status: Pt lying in bed and very frail appearing.  Answers to questions are very soft and several incorrect per review with wife.  Pt is eager to get home.  Wife notes he is "sad because he can't see any of [place] while he's there..."  Will monitor mood and refer for neuropsychology as indicated. Recent Psychosocial Issues: None Psychiatric History: None Substance Abuse History: Known ETOH abuse  Patient / Family Perceptions, Expectations & Goals Pt/Family understanding of illness &  functional limitations: Pt with very limited understanding of his medical issues.  He states, "I don't know... I came here to check on things and just ended up here."  Wife with good, basic understanding of medical issues and of current  functional limitations/ need for CIR. Premorbid pt/family roles/activities: Pt's wife does assist with b/d and transportation. Anticipated changes in roles/activities/participation: Little change anticipated as family was assisting PTA Pt/family expectations/goals: "I want to go home."  Community Resources Premorbid Home Care/DME Agencies: Other (Comment)([facility]) Transportation available at discharge: yes Resource referrals recommended: Neuropsychology  Discharge Planning Living Arrangements: Spouse/significant other Support Systems: Spouse/significant other, Children Type of Residence: Private residence Insurance Resources: [employer], [place] (specify county)([employer]) Financial Resources: Radio broadcast assistant Screen Referred: No Living Expenses: Own Money Management: Spouse Does the patient have any problems obtaining your medications?: No Home Management: wife Patient/Family Preliminary Plans: Pt to return home with wife and daughter providing any needed assistance. Social Work Anticipated Follow Up Needs: HH/OP Expected length of stay: 10-14 days  Clinical Impression Frail appearing, elderly gentleman here for debility following resp failure.  Pt unable to fully engage in assessment interview - most information gathered from wife.  Pt states he is eager to go home.  Family prepared to provide 24/7 assistance at d/c.  Will follow for support and d/c planning needs.  Daaiel Starlin 04/13/2019, 1:29 PM

## 2019-04-13 NOTE — Progress Notes (Signed)
Physical Therapy Note  Patient Details  Name: Joshua Whitaker MRN: [id number] Date of Birth: 10/05/1941 Today's Date: 04/13/2019    Pt's plan of care adjusted to 15/7 after speaking with care team as pt currently unable to tolerate current therapy schedule with OT, PT, and SLP.     Waunita Schooner 04/13/2019, 2:31 PM

## 2019-04-13 NOTE — Progress Notes (Signed)
Speech Language Pathology Daily Session Note  Patient Details  Name: Joshua Whitaker MRN: [id number] Date of Birth: 1941-08-23  Today's Date: 04/13/2019 SLP Individual Time: 1150-1220 SLP Individual Time Calculation (min): 30 min  Short Term Goals: Week 1: SLP Short Term Goal 1 (Week 1): Patient will consume current diet of Dys. 1 textures with nectar thick liquids without overt s/sx of aspiration and supervision verbal cues for use of swallowing strategies.  SLP Short Term Goal 2 (Week 1): Patient will complete pharyngeal strengthening exercises with mod A. SLP Short Term Goal 3 (Week 1): Patient will demonstrate functional mastication and oral clearance of dysphagia 2 textures over 2 sessions prior to upgrade with supervision verbal cues. SLP Short Term Goal 4 (Week 1): Patient will consume trials of thin liquids with no overt s/sx of aspriation over 2 sessions with supervision verbal cues to assess readiness for repeat MBS.  Skilled Therapeutic Interventions:  Skilled treatment session focused on dysphagia goals. SLP facilitated session by providing Max A multimodal cues to perform strengthening exercises that were initiated in previous ST session. Rationale for exercises provided as pt was not able to recall the exercises or the need for them. Pt left upright in bed, bed alarm on and all needs within reach. Continue per current plan of care.      Pain Pain Assessment Pain Scale: 0-10 Pain Score: 0-No pain  Therapy/Group: Individual Therapy  Shrinika Blatz 04/13/2019, 1:02 PM

## 2019-04-13 NOTE — IPOC Note (Signed)
Overall Plan of Care [facility]) Patient Details Name: Joshua Whitaker MRN: [id number] DOB: [birth year]  Admitting Diagnosis: <principal problem not specified>  Hospital Problems: Active Problems:   Debility     Functional Problem List: Nursing Motor, Bladder, Endurance  PT Balance, Endurance, Motor, Safety  OT Balance, Endurance, Safety, Cognition, Nutrition  SLP    TR         Basic ADL's: OT Eating, Grooming, Bathing, Dressing, Toileting     Advanced  ADL's: OT       Transfers: PT Bed Mobility, Bed to Chair, Car, Manufacturing systems engineer, Metallurgist: PT Emergency planning/management officer, Ambulation     Additional Impairments: OT Fuctional Use of Upper Extremity  SLP Swallowing      TR      Anticipated Outcomes Item Anticipated Outcome  Self Feeding mod I  Swallowing  Mod I with least restrictive diet   Basic self-care  supervision/min a  Toileting  min a   Bathroom Transfers supervision  Bowel/Bladder  pt will be continent of urine with min assist  Transfers  S basic, min assist car  Locomotion  S w/c x 150', S gait x 50' with LRAD  Communication     Cognition     Pain     Safety/Judgment      Therapy Plan: PT Intensity: Minimum of 1-2 x/day ,45 to 90 minutes PT Frequency: 5 out of 7 days PT Duration Estimated Length of Stay: 10-12 OT Intensity: Minimum of 1-2 x/day, 45 to 90 minutes OT Frequency: 5 out of 7 days OT Duration/Estimated Length of Stay: 10-14 days SLP Intensity: Minumum of 1-2 x/day, 30 to 90 minutes SLP Frequency: 3 to 5 out of 7 days SLP Duration/Estimated Length of Stay: 11-16 days   Due to the current state of emergency, patients may not be receiving their 3-hours of Medicare-mandated therapy.   Team Interventions: Nursing Interventions Patient/Family Education, Skin Care/Wound Management, Dysphagia/Aspiration Precaution Training, Discharge Planning, Bladder Management, Disease Management/Prevention, Medication Management   PT interventions Ambulation/gait training, Community reintegration, DME/adaptive equipment instruction, Neuromuscular re-education, Psychosocial support, UE/LE Strength taining/ROM, Wheelchair propulsion/positioning, Training and development officer, Discharge planning, Therapeutic Activities, UE/LE Coordination activities, Cognitive remediation/compensation, Functional mobility training, Patient/family education, Splinting/orthotics, Therapeutic Exercise  OT Interventions Training and development officer, Academic librarian, Barrister's clerk education, Self Care/advanced ADL retraining, Therapeutic Exercise, UE/LE Coordination activities, Cognitive remediation/compensation, Discharge planning, DME/adaptive equipment instruction, Functional mobility training, Therapeutic Activities  SLP Interventions Dysphagia/aspiration precaution training, Patient/family education, Therapeutic Exercise  TR Interventions    SW/CM Interventions Discharge Planning, Psychosocial Support, Patient/Family Education   Barriers to Discharge MD  Medical stability  Nursing      PT      OT      SLP      SW       Team Discharge Planning: Destination: PT-Home ,OT- Home , SLP-Home Projected Follow-up: PT-Home health PT, OT-  Home health OT, SLP-Home Health SLP, Outpatient SLP Projected Equipment Needs: PT-Wheelchair (measurements), Wheelchair cushion (measurements), OT-  , SLP-To be determined Equipment Details: PT-would benefit from a w/c for community distances, given frailty, OT-  Patient/family involved in discharge planning: PT- Patient,  OT-Patient, SLP-Patient  MD ELOS: 11-14 days Medical Rehab Prognosis:  Excellent Assessment: The patient has been admitted for CIR therapies with the diagnosis of debility and encephalopathy. The team will be addressing functional mobility, strength, stamina, balance, safety, adaptive techniques and equipment, self-care, bowel and bladder mgt, patient and caregiver education, NMR,  breathing techniques, cognition, swallowing, communication. Goals  have been set at supervision for self-care and mobility and mod I for cognition and swallowing/communication.   Due to the current state of emergency, patients may not be receiving their 3 hours per day of Medicare-mandated therapy.    Meredith Staggers, MD, FAAPMR      See Team Conference Notes for weekly updates to the plan of care

## 2019-04-13 NOTE — Progress Notes (Signed)
Occupational Therapy Session Note  Patient Details  Name: Joshua Whitaker MRN: [id number] Date of Birth: 1941/01/07  Today's Date: 04/13/2019 OT Individual Time: 0600-4599 OT Individual Time Calculation (min): 45 min  and Today's Date: 04/13/2019 OT Missed Time: 30 Minutes Missed Time Reason: Patient fatigue   Short Term Goals: Week 1:  OT Short Term Goal 1 (Week 1): patient will perform UB bathing and dressing with CS and min cues for thoroughness OT Short Term Goal 2 (Week 1): patient will complete LB bathing and dressing with mod A using ADs as needed and min cues for technique OT Short Term Goal 3 (Week 1): patient will complete bed mobility with CS OT Short Term Goal 4 (Week 1): patient will complete SPT with RW min A/CG with good safety  Skilled Therapeutic Interventions/Progress Updates:    Pt received supine, easily awoken to verbal cues. Pt on 2L [place] throughout session with SpO2 intermittently measured and all VSS. Pt reported needing to have BM and asked for bed pan. Pt was encouraged to use BSC instead and he agreed. Pt completed SPT with min A to [facility] with cueing for UE placement. Pt took several minutes to void bowels. Pt disoriented to time, thinking it was the middle of the night despite several re-orienting cues including environment (I.e. looking outside) and reading the wall clock. Pt completed peri hygiene seated with forward lean, close (S) provided. Pt required mod A to don pants, min A to don shirt while seated. Pt completed SPT back to bed with min A. Pt now perseverating on returning to bed to rest and unable to be re-directed to task. 30 min skilled OT missed.   Therapy Documentation Precautions:  Precautions Precautions: Fall Restrictions Weight Bearing Restrictions: No    Vital Signs: Therapy Vitals Temp: 97.6 F (36.4 C) Temp Source: Oral Pulse Rate: 72 Resp: 14 BP: (!) 120/53 Patient Position (if appropriate): Lying Oxygen Therapy SpO2: 97 % O2  Device: Nasal Cannula O2 Flow Rate (L/min): 2 L/min   Therapy/Group: Individual Therapy  Curtis Sites 04/13/2019, 9:10 AM

## 2019-04-14 ENCOUNTER — Inpatient Hospital Stay (HOSPITAL_COMMUNITY): Payer: Medicare Other

## 2019-04-14 ENCOUNTER — Inpatient Hospital Stay (HOSPITAL_COMMUNITY): Payer: Medicare Other | Admitting: Speech Pathology

## 2019-04-14 ENCOUNTER — Inpatient Hospital Stay (HOSPITAL_COMMUNITY): Payer: Medicare Other | Admitting: Physical Therapy

## 2019-04-14 NOTE — Progress Notes (Signed)
Speech Language Pathology Daily Session Note  Patient Details  Name: Joshua Whitaker MRN: [id number] Date of Birth: 1941-07-02  Today's Date: 04/14/2019 SLP Individual Time: 1230-1255 SLP Individual Time Calculation (min): 25 min  Missed Time: 35 mins, fatigue  Short Term Goals: Week 1: SLP Short Term Goal 1 (Week 1): Patient will consume current diet of Dys. 1 textures with nectar thick liquids without overt s/sx of aspiration and supervision verbal cues for use of swallowing strategies.  SLP Short Term Goal 2 (Week 1): Patient will complete pharyngeal strengthening exercises with mod A. SLP Short Term Goal 3 (Week 1): Patient will demonstrate functional mastication and oral clearance of dysphagia 2 textures over 2 sessions prior to upgrade with supervision verbal cues. SLP Short Term Goal 4 (Week 1): Patient will consume trials of thin liquids with no overt s/sx of aspriation over 2 sessions with supervision verbal cues to assess readiness for repeat MBS.  Skilled Therapeutic Interventions: Skilled treatment session focused on dysphagia goals. This was the 2nd attempt this clinician attempted to see patient. Upon arrival, patient was lethargic but agreeable to participate in treatment session.  SLP facilitated session by providing Mod A verbal cues for use of swallowing compensatory strategies with lunch meal of Dys. 1 textures with nectar-thick liquids. Patient with a wet, congested cough throughout meal but difficult to differentiate between possible aspiration due to consistent wet, congested cough at baseline. Recommend continue current diet. Patient with increased work of breathing throughout session but O2 at 99% with supplemental oxygen. RN aware. Patient left upright in bed with alarm on and all needs within reach. Continue with current plan of care.   Pain No/Denies Pain   Therapy/Group: Individual Therapy  Abagail Limb 04/14/2019, 2:27 PM

## 2019-04-14 NOTE — Progress Notes (Signed)
Initial Nutrition Assessment  DOCUMENTATION CODES:   Underweight, severe malnutrition diagnosed on 4/17 during acute hospitalization and suspect this persists but unable to diagnose at this time without NFPE  INTERVENTION:   - Vital Cuisine Shake TID, each supplement provides 520 kcal and 22 grams of protein  - Continue MVI with minerals daily  NUTRITION DIAGNOSIS:   Increased nutrient needs related to wound healing, chronic illness, other (therapies) as evidenced by estimated needs.  GOAL:   Patient will meet greater than or equal to 90% of their needs  MONITOR:   PO intake, Supplement acceptance, Diet advancement, Labs, Skin, Weight trends  REASON FOR ASSESSMENT:   Other (underweight BMI)    ASSESSMENT:   78 year old male with PMH of COPDand oxygen-dependentwith recent admission for exacerbation, chronic hyponatremia, EtOH abuse. Pt was admitted to [facility] on 4/6 with 2-day history of cough and SOB progressing to respiratory failure requiring intubation. Pt was transferred to [facility] on 4/7 and COVID-19 ruled out. Hypoxia felt to be due to COPD exacerbation and HCAP likely due toaspiration.Pt tolerated extubation on 4/11.GOC were discussed with family who as elected full scope of treatment. MBS on 4/15 revealed aspiration of thin liquids therefore pt placed on dysphagia 1 with nectar-thick liquids. Pt admitted to CIR on 4/18.  Pt diagnosed with severe malnutrition on 4/17 by RD after completion of NFPE revealed severe fat wasting and severe muscle wasting. Suspect malnutrition persists but unable to diagnose at this time without NFPE.  Pt with weight loss of 3 lbs since admission to CIR. Reviewed weight history in chart. Pt with 12.3 kg weight loss since 06/17/18. This is an 18.9% weight loss in 10 months which is significant for timeframe.  Reviewed RN edema assessment. On 4/20, pt with very deep pitting edema to RUE.  Attempted to speak with pt via phone call to room after  scheduled therapies ended for the day but no answer.  Given history of malnutrition and variable po intake, will order thickened oral nutrition supplements to aid in pt meeting kcal and protein needs.  Meal Completion: 10-90% x 4 meals  Medications reviewed and include: Pro-stat 30 ml BID, folic acid 1 mg daily, magnesium gluconate 500 mg daily, Megace 400 mg BID, MVI with minerals daily, Protonix, K-dur 20 mEq daily, Florastor, thiamine  Labs reviewed: potassium 3.4 (L) CBG's: 75, 76, 79, 77, 82 x 24 hours  NUTRITION - FOCUSED PHYSICAL EXAM:  Unable to complete on this date due to RD working remotely. On 4/17, pt found to have severe muscle and fat depletions. Suspect these persist.  Diet Order:   Diet Order            DIET - DYS 1 Room service appropriate? Yes with Assist; Fluid consistency: Nectar Thick  Diet effective now              EDUCATION NEEDS:   Not appropriate for education at this time  Skin:  Skin Assessment: Skin Integrity Issues: Stage I: sacrum, left elbow, right elbow, left heel, right heel  Last BM:  04/14/19 medium type 5  Height:   Ht Readings from Last 1 Encounters:  04/11/19 5\' 10"  (1.778 m)    Weight:   Wt Readings from Last 1 Encounters:  04/14/19 52.8 kg    Ideal Body Weight:  75.5 kg  BMI:  Body mass index is 16.7 kg/m.  Estimated Nutritional Needs:   Kcal:  1850-2050  Protein:  90-105 grams  Fluid:  1.8-2.0 L  Kate Jablonski Nachman Sundt, MS, RD, LDN Inpatient Clinical Dietitian Pager: [phone] Weekend/After Hours: [phone]  

## 2019-04-14 NOTE — Progress Notes (Signed)
[facility] PHYSICAL MEDICINE & REHABILITATION PROGRESS NOTE   Subjective/Complaints: Feeling stronger. Appetite improving too. No oxygen this morning  ROS: Patient denies fever, rash, sore throat, blurred vision, nausea, vomiting, diarrhea, cough, shortness of breath or chest pain, joint or back pain, headache, or mood change.   Objective:   No results found. Recent Labs    04/13/19 0624  WBC 12.9*  HGB 12.0*  HCT 36.5*  PLT 244   Recent Labs    04/13/19 0624  NA 138  K 3.4*  CL 105  CO2 28  GLUCOSE 99  BUN 20  CREATININE 0.57*  CALCIUM 7.8*    Intake/Output Summary (Last 24 hours) at 04/14/2019 0859 Last data filed at 04/14/2019 0800 Gross per 24 hour  Intake 631 ml  Output -  Net 631 ml     Physical Exam: Vital Signs Blood pressure 127/60, pulse 72, temperature 98.1 F (36.7 C), resp. rate 20, height 5\' 10"  (1.778 m), weight 52.8 kg, SpO2 100 %. Constitutional: No distress . Vital signs reviewed. Frail appearing HEENT: EOMI, oral membranes moist Neck: supple Cardiovascular: RRR without murmur. No JVD    Respiratory: CTA Bilaterally without wheezes or rales. Normal effort    GI: BS +, non-tender, non-distended  Neurological: He is alert. Oriented to person, place, reason. Improving insight and awareness. UE 4/5 prox to distal. LE: 3 to 3+/5 HF, KE and 4/5 distally. Senses pain and light touch in all 4's. Skin: Skin iswarm. He isnot diaphoretic.  Psychiatric: Pleasant, remains flat    Assessment/Plan: 1. Functional deficits secondary to debility/encephalopathy which require 3+ hours per day of interdisciplinary therapy in a comprehensive inpatient rehab setting.  Physiatrist is providing close team supervision and 24 hour management of active medical problems listed below.  Physiatrist and rehab team continue to assess barriers to discharge/monitor patient progress toward functional and medical goals  Care Tool:  Bathing    Body parts bathed  by patient: Chest, Abdomen   Body parts bathed by helper: Front perineal area, Buttocks     Bathing assist Assist Level: Dependent - Patient 0%     Upper Body Dressing/Undressing Upper body dressing   What is the patient wearing?: Pull over shirt    Upper body assist Assist Level: Moderate Assistance - Patient 50 - 74%    Lower Body Dressing/Undressing Lower body dressing    Lower body dressing activity did not occur: N/A What is the patient wearing?: Hospital gown only     Lower body assist Assist for lower body dressing: Maximal Assistance - Patient 25 - 49%     Toileting Toileting    Toileting assist Assist for toileting: Minimal Assistance - Patient > 75%     Transfers Chair/bed transfer  Transfers assist     Chair/bed transfer assist level: Minimal Assistance - Patient > 75%     Locomotion Ambulation   Ambulation assist      Assist level: Minimal Assistance - Patient > 75% Assistive device: Walker-rolling Max distance: 5'   Walk 10 feet activity   Assist  Walk 10 feet activity did not occur: Safety/medical concerns        Walk 50 feet activity   Assist Walk 50 feet with 2 turns activity did not occur: Safety/medical concerns         Walk 150 feet activity   Assist Walk 150 feet activity did not occur: Safety/medical concerns    Assistive device: Walker-rolling    Walk 10 feet on uneven surface  activity   Assist Walk 10 feet on uneven surfaces activity did not occur: Safety/medical concerns         Wheelchair     Assist Will patient use wheelchair at discharge?: Yes Type of Wheelchair: Manual    Wheelchair assist level: Dependent - Patient 0%      Wheelchair 50 feet with 2 turns activity    Assist    Wheelchair 50 feet with 2 turns activity did not occur: Safety/medical concerns(SOB)       Wheelchair 150 feet activity     Assist Wheelchair 150 feet activity did not occur: Safety/medical  concerns(SOB)        Medical Problem List and Plan: 1.Functional and mobility deficitssecondary to debility and encephalopathy after respiratory failure -Continue CIR therapies including PT, OT, and SLP   -team conference today 2. Antithrombotics: -DVT/anticoagulation:Pharmaceutical:Other (comment)--Eliquis -antiplatelet therapy: Plavix (no ASA) 3. Pain Management:tylenol prn as well as local measures prn. 4. Mood:LCSW to follow for evaluation and support. -antipsychotic agents: N/A 5. Neuropsych: This patientis notcapable of making decisions onhisown behalf. 6. Skin/Wound Care:routine pressure relief measures. 7. Fluids/Electrolytes/Nutrition:   -he is emaciated   -replete K+  -replete Mg++ borderline 8. CAD/A Flutter: Continue metoprolol 12.5 mg bid with Apixaban and Plavix.  9. COPD- oxygen dependent: Respiratory status improving. Continue Incruse and Ellipta daily with albuterol prn.   -on home O2 (at night only apparently)  -continue to wean as able 10 Abnormal LFTs: returning to normal.   11. Leukocytosis:12.9k 4/20  -afebrile. No active signs of infection  -recheck this week 12. Moderate protein Malnutrition: continue prostat.  . Encourage PO,  - megace trial  -intake poor to inconsistent yesterday    LOS: 3 days A FACE TO FACE EVALUATION WAS PERFORMED  Meredith Staggers 04/14/2019, 8:59 AM

## 2019-04-14 NOTE — Plan of Care (Signed)
  Problem: Consults Goal: Skin Care Protocol Initiated - if Braden Score 18 or less Description If consults are not indicated, leave blank or document N/A no further skin breakdown healing of stage I pressure ulcers with min assist  Outcome: Progressing   Problem: RH BOWEL ELIMINATION Goal: RH STG MANAGE BOWEL WITH ASSISTANCE Description STG Manage Bowel with  Mod Assistance.   Outcome: Progressing   Problem: RH BLADDER ELIMINATION Goal: RH STG MANAGE BLADDER WITH ASSISTANCE Description STG Manage Bladder With  Min Assistance  Outcome: Progressing Goal: RH STG MANAGE BLADDER WITH EQUIPMENT WITH ASSISTANCE Description STG Manage Bladder With Equipment With Assistance Outcome: Progressing   Problem: RH SKIN INTEGRITY Goal: RH STG SKIN FREE OF INFECTION/BREAKDOWN Description Stage I pressure ulcers will improve with min assist  Outcome: Progressing Goal: RH STG MAINTAIN SKIN INTEGRITY WITH ASSISTANCE Description STG Maintain Skin Integrity With Assistance. Outcome: Progressing   Problem: RH SAFETY Goal: RH STG ADHERE TO SAFETY PRECAUTIONS W/ASSISTANCE/DEVICE Description STG Adhere to Safety Precautions With Min  Assistance/Device.  Outcome: Progressing   Problem: RH KNOWLEDGE DEFICIT GENERAL Goal: RH STG INCREASE KNOWLEDGE OF SELF CARE AFTER HOSPITALIZATION Description Pt and family will be able to care for self upon discharge  Outcome: Progressing

## 2019-04-14 NOTE — Discharge Instructions (Addendum)
Inpatient Rehab Discharge Instructions  Joshua Whitaker Discharge date and time:  04/23/19  Activities/Precautions/ Functional Status: Activity: no lifting, driving, or strenuous exercise till cleared by MD Diet: cardiac diet--Pureed foods. Thicken liquids to nectar consistency ( to prevent aspiration) Wound Care: none needed    Functional status:  ___ No restrictions     ___ Walk up steps independently _X__ 24/7 supervision/assistance   ___ Walk up steps with assistance ___ Intermittent supervision/assistance  ___ Bathe/dress independently ___ Walk with walker     ___ Bathe/dress with assistance ___ Walk Independently    ___ Shower independently ___ Walk with assistance    ___ Shower with assistance _X__ No alcohol     ___ Return to work/school ________    COMMUNITY REFERRALS UPON DISCHARGE:    Home Health:   PT     OT     ST    RN                      Agency:  [facility] Phone: [phone]   Medical Equipment/Items Ordered:  Suction machine, wheelchair, walker, tub bench                                                      Agency/Supplier:  [facility] @ [phone]  Special Instructions: 1. Needs to be toiled before bed to empty bladder. 2. Family needs to offer supplements during the day and encourage intake. Follow aspiration precautions given by speech therapy.   3. He needs hands on assistance. Need to put support stockings on during the day. Elevate head of bed and take time sitting at edge of bed before transfers to prevent BP drop.  4. Continue to use oxygen around the clock till endurance improves. Needs to follow up with primary MD in 1-2 weeks.    My questions have been answered and I understand these instructions. I will adhere to these goals and the provided educational materials after my discharge from the hospital.  Patient/Caregiver Signature _______________________________ Date __________  Clinician Signature  _______________________________________ Date __________  Please bring this form and your medication list with you to all your follow-up doctor's appointments.   Information on my medicine - ELIQUIS (apixaban)  This medication education was reviewed with me or my healthcare representative as part of my discharge preparation.    Why was Eliquis prescribed for you? Eliquis was prescribed for you to reduce the risk of a blood clot forming that can cause a stroke if you have a medical condition called atrial fibrillation (a type of irregular heartbeat).  What do You need to know about Eliquis ? Take your Eliquis TWICE DAILY - one tablet in the morning and one tablet in the evening with or without food. If you have difficulty swallowing the tablet whole please discuss with your pharmacist how to take the medication safely.  Take Eliquis exactly as prescribed by your doctor and DO NOT stop taking Eliquis without talking to the doctor who prescribed the medication.  Stopping may increase your risk of developing a stroke.  Refill your prescription before you run out.  After discharge, you should have regular check-up appointments with your healthcare provider that is prescribing your Eliquis.  In the future your dose may need to be changed if your kidney function or weight changes  by a significant amount or as you get older.  What do you do if you miss a dose? If you miss a dose, take it as soon as you remember on the same day and resume taking twice daily.  Do not take more than one dose of ELIQUIS at the same time to make up a missed dose.  Important Safety Information A possible side effect of Eliquis is bleeding. You should call your healthcare provider right away if you experience any of the following: ? Bleeding from an injury or your nose that does not stop. ? Unusual colored urine (red or dark brown) or unusual colored stools (red or black). ? Unusual bruising for unknown  reasons. ? A serious fall or if you hit your head (even if there is no bleeding).  Some medicines may interact with Eliquis and might increase your risk of bleeding or clotting while on Eliquis. To help avoid this, consult your healthcare provider or pharmacist prior to using any new prescription or non-prescription medications, including herbals, vitamins, non-steroidal anti-inflammatory drugs (NSAIDs) and supplements.  This website has more information on Eliquis (apixaban): http://[email or web address].com/eliquis/home

## 2019-04-14 NOTE — Progress Notes (Signed)
Occupational Therapy Session Note  Patient Details  Name: Joshua Whitaker MRN: [id number] Date of Birth: Oct 14, 1941  Today's Date: 04/14/2019 OT Individual Time: [phone] OT Individual Time Calculation (min): 69 min    Short Term Goals: Week 1:  OT Short Term Goal 1 (Week 1): patient will perform UB bathing and dressing with CS and min cues for thoroughness OT Short Term Goal 2 (Week 1): patient will complete LB bathing and dressing with mod A using ADs as needed and min cues for technique OT Short Term Goal 3 (Week 1): patient will complete bed mobility with CS OT Short Term Goal 4 (Week 1): patient will complete SPT with RW min A/CG with good safety  Skilled Therapeutic Interventions/Progress Updates:    Pt received supine with no c/o pain, more alert than previous sessions yesterday. Pt required moderate cueing to initiate mobility to sit EOB. Pt required min A to elevate trunk. Pt was set up to eat breakfast with intermittent (S) provided as items were obtained and prepared for shower. Pt had no [place] on upon entry to room and SpO2 was intermittently monitored as he sat EOB, with no desaturations under 95% obtained. 2L [place] was donned in preparation for functional mobility into bathroom,which he completed with CGA. Pt required cueing for UE placement/positioning prior to transfer but completed sit > stand with CGA. Pt required min A for clothing management in standing. Pt voided bowels and required min A to complete peri hygiene. While on 2L [place] Pt began having increased RR (30) and OT was unable to get an accurate SpO2 or HR reading from pulse oximeter. RT entered room to administer inhaler and assisted in warming up hands to attempt and get better reading. Preliminary readings measured SpO2 in the low 80% but following functional mobility back to bed and reading on ear lobe- reading obtained was 100% SpO2. Pt still stating he feels "funny" and he was returned to supine in bed. Pt left with all  needs met, bed alarm set.   Therapy Documentation Precautions:  Precautions Precautions: Fall Restrictions Weight Bearing Restrictions: No   Therapy/Group: Individual Therapy  Curtis Sites 04/14/2019, 7:13 AM

## 2019-04-14 NOTE — Progress Notes (Signed)
Physical Therapy Session Note  Patient Details  Name: Joshua Whitaker MRN: [id number] Date of Birth: 05-23-1941  Today's Date: 04/14/2019 PT Individual Time: 3888-2800 PT Individual Time Calculation (min): 53 min   Short Term Goals: Week 1:  PT Short Term Goal 1 (Week 1): pt will move supine> sit with min assist PT Short Term Goal 2 (Week 1): pt will sit> stand with min assist PT Short Term Goal 3 (Week 1): pt will propel w/c x 50' with supervision PT Short Term Goal 4 (Week 1): pt will perform gait with LRAD x 25'  Skilled Therapeutic Interventions/Progress Updates:  Pt received in bed reporting need to use restroom. Pt transferred supine>sitting EOB with min assist with assistance to place UE on rails to assist with movement. Pt transfers bed>w/c via stand pivot with min assist with physical assist to place hands on armrest/bed rail for transfer. Pt transfers w/c<>toilet with grab bars & min assist, pulling pants & brief down over hips with extra time & CGA for balance. Pt with soiled brief 2/2 urinary incontinence and with void & BM on toilet with extra time. Pt performs peri hygiene with therapist completing after pt to ensure cleanliness. Therapist provides total assist for donning clean brief & pt returns to w/c with min assist stand pivot. Pt completes hand hygiene at sink from w/c level with supervision. SpO2 = 100%, HR = 85 bpm (via ear 2/2 cold fingers) & pt reports fatigue. Pt transfers sit<>stand with min assist with cuing for hand placement to push up on armrests. Pt ambulates 20 ft + 20 ft with RW & min assist with mod assist and cuing to problem solve turning RW to sit in w/c at end of activity; SpO2 = 99 %, HR = 86 bpm. Pt performed BLE long arc quads with cuing to attend to repetitions with task focusing on BLE strengthening. Pt requested water & provided thickened liquid with occasional cuing for small sips. Pt left sitting in recliner with BLE elevated, chair alarm donned, all  needs in reach. Reviewed use of call bell with pt return demonstrating.  Therapy Documentation Precautions:  Precautions Precautions: Fall Restrictions Weight Bearing Restrictions: No   Vital Signs: Pt on 2L/min supplemental oxygen via nasal cannula during session.   Therapy/Group: Individual Therapy  Waunita Schooner 04/14/2019, 2:34 PM

## 2019-04-14 NOTE — Progress Notes (Signed)
Physical Therapy Session Note  Patient Details  Name: Joshua Whitaker MRN: [id number] Date of Birth: 1941-11-15  Today's Date: 04/14/2019 PT Individual Time: 4076-8088 PT Individual Time Calculation (min): 30 min   Short Term Goals: Week 1:  PT Short Term Goal 1 (Week 1): pt will move supine> sit with min assist PT Short Term Goal 2 (Week 1): pt will sit> stand with min assist PT Short Term Goal 3 (Week 1): pt will propel w/c x 50' with supervision PT Short Term Goal 4 (Week 1): pt will perform gait with LRAD x 25'  Skilled Therapeutic Interventions/Progress Updates:    Patient in supine.  Needed max encouragement, increased time and mod A to perform supine to sit.  Seated EOB x 15 min again max encouragement provided with increased time given for pt to perform sit to stand and ambulate.  Patient instead returned to supine.  Cues and min A to scoot to [facility].  Left in supine with call bell in reach and bed alarm activated.  Repositioned O2 for improved fit.     Therapy Documentation Precautions:  Precautions Precautions: Fall Restrictions Weight Bearing Restrictions: No Pain: Pain Assessment Pain Scale: Faces Pain Score: 0-No pain Faces Pain Scale: No hurt    Therapy/Group: Individual Therapy  Reginia Naas  Magda Kiel, PT 04/14/2019, 4:43 PM

## 2019-04-14 NOTE — Care Management (Signed)
[facility] Individual Statement of Services  Patient Name:  Joshua Whitaker  Date:  04/14/2019  Welcome to the [facility].  Our goal is to provide you with an individualized program based on your diagnosis and situation, designed to meet your specific needs.  With this comprehensive rehabilitation program, you will be expected to participate in at least 3 hours of rehabilitation therapies Monday-Friday, with modified therapy programming on the weekends.  Your rehabilitation program will include the following services:  Physical Therapy (PT), Occupational Therapy (OT), Speech Therapy (ST), 24 hour per day rehabilitation nursing, Therapeutic Recreaction (TR), Neuropsychology, Case Management (Social Worker), Rehabilitation Medicine, Nutrition Services and Pharmacy Services  Weekly team conferences will be held on Tuesdays to discuss your progress.  Your Social Worker will talk with you frequently to get your input and to update you on team discussions.  Team conferences with you and your family in attendance may also be held.  Expected length of stay: 10-14 days   Overall anticipated outcome: supervision  Depending on your progress and recovery, your program may change. Your Social Worker will coordinate services and will keep you informed of any changes. Your Social Worker's name and contact numbers are listed  below.  The following services may also be recommended but are not provided by the [facility] will be made to provide these services after discharge if needed.  Arrangements include referral to agencies that provide these services.  Your insurance has been verified to be:  [facility] Medicare/ Medicaid Your primary doctor is:  [name]  Pertinent information will be shared with your doctor and your insurance  company.  Social Worker:  [place], [place] or [phone]   Information discussed with and copy given to patient by: Lennart Pall, 04/14/2019, 11:33 AM

## 2019-04-15 ENCOUNTER — Inpatient Hospital Stay (HOSPITAL_COMMUNITY): Payer: Medicare Other

## 2019-04-15 ENCOUNTER — Inpatient Hospital Stay (HOSPITAL_COMMUNITY): Payer: Medicare Other | Admitting: Speech Pathology

## 2019-04-15 ENCOUNTER — Inpatient Hospital Stay (HOSPITAL_COMMUNITY): Payer: Medicare Other | Admitting: Physical Therapy

## 2019-04-15 NOTE — Progress Notes (Signed)
Speech Language Pathology Daily Session Notes  Patient Details  Name: Joshua Whitaker MRN: [id number] Date of Birth: 24-Sep-1941  Today's Date: 04/15/2019  Session 1: SLP Individual Time: 0830-0920 SLP Individual Time Calculation (min): 50 min   Session 2: Missed Time: 60 minutes Reason: Fatigue, unwilling to participate    Short Term Goals: Week 1: SLP Short Term Goal 1 (Week 1): Patient will consume current diet of Dys. 1 textures with nectar thick liquids without overt s/sx of aspiration and supervision verbal cues for use of swallowing strategies.  SLP Short Term Goal 2 (Week 1): Patient will complete pharyngeal strengthening exercises with mod A. SLP Short Term Goal 3 (Week 1): Patient will demonstrate functional mastication and oral clearance of dysphagia 2 textures over 2 sessions prior to upgrade with supervision verbal cues. SLP Short Term Goal 4 (Week 1): Patient will consume trials of thin liquids with no overt s/sx of aspriation over 2 sessions with supervision verbal cues to assess readiness for repeat MBS.  Skilled Therapeutic Interventions:  Session 1: Skilled treatment session focused on dysphagia goals. SLP facilitated session by providing supervision verbal cues for use of swallowing compensatory strategies with breakfast meal of Dys. 1 textures with nectar-thick liquids. Patient consumed meal without overt s/s of aspiration but required Min verbal cues to self-monitor and correct anterior spillage of liquids. Patient appeared brighter and more alert this session with increased overall social engagement. Patient left upright in bed with alarm on and all needs within reach. Continue with current plan of care.   Session 2: Patient declined 60 minutes of skilled SLP intervention due to fatigue despite Max encouragement. Patient declined trials of advanced solids/liquids and declined to perform pharyngeal strengthening exercises due to fatigue. Patient left upright in recliner  with alarm on and all needs within reach. Continue with current plan of care.    Pain Pain Assessment Pain Scale: Faces Faces Pain Scale: No hurt  Therapy/Group: Individual Therapy  Ayahna Solazzo 04/15/2019, 12:34 PM

## 2019-04-15 NOTE — Progress Notes (Signed)
Occupational Therapy Session Note  Patient Details  Name: Joshua Whitaker MRN: [id number] Date of Birth: 12/02/1941  Today's Date: 04/15/2019 OT Individual Time: 1430-1515 OT Individual Time Calculation (min): 45 min    Short Term Goals: Week 1:  OT Short Term Goal 1 (Week 1): patient will perform UB bathing and dressing with CS and min cues for thoroughness OT Short Term Goal 2 (Week 1): patient will complete LB bathing and dressing with mod A using ADs as needed and min cues for technique OT Short Term Goal 3 (Week 1): patient will complete bed mobility with CS OT Short Term Goal 4 (Week 1): patient will complete SPT with RW min A/CG with good safety  Skilled Therapeutic Interventions/Progress Updates:    Pt received in recliner, no c/o pain. Pt VERY slow to respond this session and requiring MAX multimodal cueing to initiate any movement. Pt's vitals overall WNL and pt unable to verbalize any discomfort. Pt reported needing to use bathroom and requiring heavy increased time to initiate movement. Pt used RW to complete functional mobility into bathroom with CGA. Pt stopped several times and when questioned stated he was "resting". Pt required mod A for clothing management in standing. Pt had BM and required max A for hygiene. Pt transferred to w/c d/t time constraints and returned to supine in bed. Pt left with all needs met. Pt on 2L [place] throughout session.   Therapy Documentation Precautions:  Precautions Precautions: Fall Restrictions Weight Bearing Restrictions: No   Therapy/Group: Individual Therapy  Curtis Sites 04/15/2019, 5:16 PM

## 2019-04-15 NOTE — Progress Notes (Signed)
Social Work Patient ID: Joshua Whitaker, male   DOB: 04/11/1941, 78 y.o.   MRN: [id number]   Have reviewed team conference with pt who remains fairly disengaged.  He is aware of ELS ~ 3 weeks, however, still eager to go home.  He was a little more alert than yesterday and sitting up in w/c.  Thanks me for the update.  Spoke yesterday with pt's daughter, [name], to review team conference and gather more info about pt's prior functioning overall. She expressed much concerned about pt's decline over the past year (approx).  She reports that his drinking was fairly minimal with "a couple of beers" each day that slowly increased and, over the past ~6 months, notes he was drinking constantly and "not eating".  She notes he has had several falls at home which she feels were directly caused by his intoxication.  She reports that, over that past 6 months, he lost several siblings and "that really pushed him over the edge and the drinking really got worse.  He was so depressed."  She also reports that he has been asking family members to bring him beer to the hospital.  She is very hopeful that he will participate in rehab and work to rebuild his overall strength and nutritional status as she felt he was drinking himself to death PTA.  She also notes that one of pt's daughters is in a nursing home now at the age of 51 "because she drank herself into nothing...she doesn't weigh anything anymore and can't get out of the bed and my dad was on his way to that, too."  Daughter feels strongly that there was a large depressive component driving his ETOH abuse.   Daughter reports that family will no longer allow alcohol in the home and that he "won't be able to drive so he can't get any."  She also notes that they have secured a new home rental with better accessibility and "no more wood stove!"  Family is supportive but, clearly, were feeling overwhelmed with concern for him prior to his admit.  Will discuss further with MD  and request neuropsychology involvement while here.  Trilby Way, LCSW

## 2019-04-15 NOTE — Progress Notes (Signed)
[facility] PHYSICAL MEDICINE & REHABILITATION PROGRESS NOTE   Subjective/Complaints: Asleep when I arrived. No new issues. Slow going with therapies so far  ROS: Patient denies fever, rash, sore throat, blurred vision, nausea, vomiting, diarrhea, cough,  chest pain, joint or back pain, headache, or mood change.   Objective:   No results found. Recent Labs    04/13/19 0624  WBC 12.9*  HGB 12.0*  HCT 36.5*  PLT 244   Recent Labs    04/13/19 0624  NA 138  K 3.4*  CL 105  CO2 28  GLUCOSE 99  BUN 20  CREATININE 0.57*  CALCIUM 7.8*    Intake/Output Summary (Last 24 hours) at 04/15/2019 0829 Last data filed at 04/14/2019 1530 Gross per 24 hour  Intake 236 ml  Output -  Net 236 ml     Physical Exam: Vital Signs Blood pressure 122/66, pulse 80, temperature 98.2 F (36.8 C), temperature source Oral, resp. rate 19, height 5\' 10"  (1.778 m), weight 52.1 kg, SpO2 99 %. Constitutional: No distress . Vital signs reviewed. HEENT: EOMI, oral membranes moist Neck: supple Cardiovascular: RRR without murmur. No JVD    Respiratory: CTA Bilaterally without wheezes or rales. Normal effort at rest. [place]  GI: BS +, non-tender, non-distended  Neurological: He is alert. Oriented to person, place, reason. Improving insight and awareness. UE 4/5 prox to distal. LE: 3 to 3+/5 HF, KE and 4/5 distally. Senses pain and light touch in all 4's. Skin: Skin iswarm. He isnot diaphoretic.  Psychiatric: Flat. Doesn't initiate much   Assessment/Plan: 1. Functional deficits secondary to debility/encephalopathy which require 3+ hours per day of interdisciplinary therapy in a comprehensive inpatient rehab setting.  Physiatrist is providing close team supervision and 24 hour management of active medical problems listed below.  Physiatrist and rehab team continue to assess barriers to discharge/monitor patient progress toward functional and medical goals  Care Tool:  Bathing    Body parts  bathed by patient: Chest, Abdomen   Body parts bathed by helper: Front perineal area, Buttocks     Bathing assist Assist Level: Dependent - Patient 0%     Upper Body Dressing/Undressing Upper body dressing   What is the patient wearing?: Hospital gown only    Upper body assist Assist Level: Moderate Assistance - Patient 50 - 74%    Lower Body Dressing/Undressing Lower body dressing    Lower body dressing activity did not occur: N/A What is the patient wearing?: Hospital gown only     Lower body assist Assist for lower body dressing: Moderate Assistance - Patient 50 - 74%     Toileting Toileting    Toileting assist Assist for toileting: Contact Guard/Touching assist     Transfers Chair/bed transfer  Transfers assist     Chair/bed transfer assist level: Minimal Assistance - Patient > 75%     Locomotion Ambulation   Ambulation assist      Assist level: Minimal Assistance - Patient > 75% Assistive device: Walker-rolling Max distance: 20 ft    Walk 10 feet activity   Assist  Walk 10 feet activity did not occur: Safety/medical concerns  Assist level: Minimal Assistance - Patient > 75% Assistive device: Walker-rolling   Walk 50 feet activity   Assist Walk 50 feet with 2 turns activity did not occur: Safety/medical concerns         Walk 150 feet activity   Assist Walk 150 feet activity did not occur: Safety/medical concerns    Assistive device: Walker-rolling  Walk 10 feet on uneven surface  activity   Assist Walk 10 feet on uneven surfaces activity did not occur: Safety/medical concerns         Wheelchair     Assist Will patient use wheelchair at discharge?: Yes Type of Wheelchair: Manual    Wheelchair assist level: Dependent - Patient 0%      Wheelchair 50 feet with 2 turns activity    Assist    Wheelchair 50 feet with 2 turns activity did not occur: Safety/medical concerns(SOB)       Wheelchair 150 feet  activity     Assist Wheelchair 150 feet activity did not occur: Safety/medical concerns(SOB)        Medical Problem List and Plan: 1.Functional and mobility deficitssecondary to debility and encephalopathy after respiratory failure -Continue CIR therapies including PT, OT, and SLP    -exercise stamina an issue 2. Antithrombotics: -DVT/anticoagulation:Pharmaceutical:Other (comment)--Eliquis -antiplatelet therapy: Plavix (no ASA) 3. Pain Management:tylenol prn as well as local measures prn. 4. Mood:LCSW to follow for evaluation and support. -antipsychotic agents: N/A 5. Neuropsych: This patientis notcapable of making decisions onhisown behalf. 6. Skin/Wound Care:routine pressure relief measures. 7. Fluids/Electrolytes/Nutrition:   -he is emaciated   -repleting K+  -repleting Mg++ borderline 8. CAD/A Flutter: Continue metoprolol 12.5 mg bid with Apixaban and Plavix.  9. COPD- oxygen dependent: Respiratory status improving. Continue Incruse and Ellipta daily with albuterol prn.   -on home O2 (at night only apparently)  -continue to wean as able 10 Abnormal LFTs: returning to normal.   11. Leukocytosis:12.9k 4/20  -afebrile. No active signs of infection  -recheck Thursday 12. Moderate protein Malnutrition: continue prostat.  . Encourage PO,  - megace trial  -ate a bit more yesterdaay    LOS: 4 days A FACE TO FACE EVALUATION WAS PERFORMED  Meredith Staggers 04/15/2019, 8:29 AM

## 2019-04-15 NOTE — Progress Notes (Addendum)
Physical Therapy Session Note  Patient Details  Name: Joshua Whitaker MRN: [id number] Date of Birth: 10/28/1941  Today's Date: 04/15/2019 PT Individual Time: 1050-1122 PT Individual Time Calculation (min): 32 min   Short Term Goals: Week 1:  PT Short Term Goal 1 (Week 1): pt will move supine> sit with min assist PT Short Term Goal 2 (Week 1): pt will sit> stand with min assist PT Short Term Goal 3 (Week 1): pt will propel w/c x 50' with supervision PT Short Term Goal 4 (Week 1): pt will perform gait with LRAD x 25'  Skilled Therapeutic Interventions/Progress Updates:  Pt received in bed requesting water & therapist provided pt with thickened liquids & provided occasional cuing for small sips. Pt denies c/o pain. Pt oriented to location but not situation with therapist educating him. Pt found to be incontinent of urine & bed saturated with pt unaware. Pt requires max multimodal cuing to initiate supine>sitting EOB with max cuing to use bed rail to pull to sit vs pt reaching to hold onto therapist, but pt able to complete overall movement with supervision. Pt requires significantly prolonged sitting break once EOB before initiating sit>stand and stand pivot to recliner with RW & min assist. Therapist provides total assist for donning clean brief and pants. Pt requires MAX multimodal cuing & encouragement to transfer sit>stand in preparation for a walk but pt unable to initiate despite multiple attempts & encouragement & pt with limited engagement. Pt continues to report fatigue. Pt  Missed 13 minutes of skilled PT treatment 2/2 fatigue.   Pt on 2L/min supplemental oxygen via nasal cannula throughout session & SpO2 >90% but pt frequently c/o feeling SOB. Pt requires cuing to close mouth & breathe through nose. Pt with very wet sounding cough during session.  Therapy Documentation Precautions:  Precautions Precautions: Fall Restrictions Weight Bearing Restrictions: No   General: PT Amount  of Missed Time (min): 12 Minutes PT Missed Treatment Reason: Patient fatigue     Therapy/Group: Individual Therapy  Waunita Schooner 04/15/2019, 11:30 AM

## 2019-04-15 NOTE — Patient Care Conference (Signed)
Inpatient RehabilitationTeam Conference and Plan of Care Update Date: 04/14/2019   Time: 2:50 PM    Patient Name: Joshua Whitaker      Medical Record Number: [id number]  Date of Birth: [birth year] Sex: Male         Room/Bed: 4W26C/4W26C-01 Payor Info: Payor: [employer] MEDICARE / Plan: [facility] MEDICARE / Product Type: *No Product type* /    Admitting Diagnosis: debility  Admit Date/Time:  04/11/2019  5:17 AM Admission Comments: No comment available   Primary Diagnosis:  <principal problem not specified> Principal Problem: <principal problem not specified>  Patient Active Problem List   Diagnosis Date Noted  . Debility 04/11/2019  . Protein-calorie malnutrition, severe 04/10/2019  . Hypokalemia   . Weakness generalized   . Adult failure to thrive   . Pericardial effusion 04/02/2019  . Palliative care by specialist   . DNR (do not resuscitate) discussion   . Lobar pneumonia ([place]) 04/01/2019  . Acute on chronic respiratory failure ([place]) 04/01/2019  . Atrial flutter ([place]) 03/30/2019  . Acute on chronic respiratory failure with hypoxia ([place]) 03/16/2019  . Alcohol abuse 03/16/2019  . Tobacco abuse 03/16/2019  . Medically noncompliant 03/16/2019  . Pressure injury of skin 03/14/2019  . Coronary artery calcification 03/14/2019  . Aortic atherosclerosis ([place]) 03/14/2019  . Chronic obstructive pulmonary disease ([place])   . Hyponatremia   . ST elevation   . Transaminitis   . Nodule of right lung 10/12/2015  . H/O prostate cancer 04/21/2014  . Essential hypertension 03/20/2011  . Hyperlipidemia 03/20/2011  . Panlobular emphysema ([place]) 03/20/2011    Expected Discharge Date: Expected Discharge Date: (3 weeks)  Team Members Present: Physician leading conference: Dr. Alger Simons Social Worker Present: Lennart Pall, LCSW Nurse Present: Mohammed Kindle, LPN PT Present: Lavone Nian, PT OT Present: Other (comment)(Sandra Rosana Hoes, OT) SLP Present: Weston Anna, SLP PPS  Coordinator present : Gunnar Fusi     Current Status/Progress Goal Weekly Team Focus  Medical   admitted after respiratory failure and debility/encephalopathy related to this. on [place] oxygen, nutrition has been poor and stamina has been poor  improve stamina  nutrition, oxygen consumption, pulmonary mgt   Bowel/Bladder   Incontinent of bowel/bladder; LBm 04/13/19  To be continent of bowel/bladder with Mod assist  Assist with bowel/bladder needs PRN   Swallow/Nutrition/ Hydration   Dys. 1 textures with nectar-thick liquids, Mod A  Supervision-Mod I  tolerance of current diet, use of swallowing compensatory strategies    ADL's   Low activity tolerance, fatigues quickly. Mod A LB ADLs, min A UB. Min-mod A transfers  Supervision overall ADLs and transfers  functional activity tolerance, ADL retraining, ADL transfers   Mobility   min assist gait x 8 ft with RW, mod assist bed mobility & transfers, very poor endurance/activity tolerance  supervision overall with LRAD except min assist car transfer  endurance/activity tolerance, strengthening, sitting/standing balance, gait, bed mobility, transfers, pt education   Communication             Safety/Cognition/ Behavioral Observations            Pain   No complain of pain  <2  Assess and treat pain q shift and as needed   Skin   Stage II to the secrun with foam dsg  Maintain skin integrity with min assist  Assess skin q shift and as needed    Rehab Goals Patient on target to meet rehab goals: Yes *See Care Plan and progress notes for long and  short-term goals.     Barriers to Discharge  Current Status/Progress Possible Resolutions Date Resolved   Physician    Medical stability        profound debility      Nursing                  PT  Decreased caregiver support  unsure if family can provide 24 hr assist              OT                  SLP                SW                Discharge Planning/Teaching Needs:  Pt to return home  with family/ wife providing 24/7 assistance.  Teaching to be planned closer to d/c.   Team Discussion:  Multiple medical issues;  Malnourished.  Easily fatigues and +/- cont.  Min/ mod assist b/d. Min bed movility and transfers today.  ST notes D1 nectar currently.hope to wean down O2.  Not ready to set d/c date.  Anticipate ~ 3 week LOS.  Revisions to Treatment Plan:  NA    Continued Need for Acute Rehabilitation Level of Care: The patient requires daily medical management by a physician with specialized training in physical medicine and rehabilitation for the following conditions: Daily direction of a multidisciplinary physical rehabilitation program to ensure safe treatment while eliciting the highest outcome that is of practical value to the patient.: Yes Daily medical management of patient stability for increased activity during participation in an intensive rehabilitation regime.: Yes Daily analysis of laboratory values and/or radiology reports with any subsequent need for medication adjustment of medical intervention for : Neurological problems;Pulmonary problems;Other   I attest that I was present, lead the team conference, and concur with the assessment and plan of the team.   Donata Clay, Shemaiah Round 04/15/2019, 11:13 AM    Team conference was held via web/ teleconference due to [facility] - 19

## 2019-04-16 ENCOUNTER — Inpatient Hospital Stay (HOSPITAL_COMMUNITY): Payer: Medicare Other

## 2019-04-16 ENCOUNTER — Inpatient Hospital Stay (HOSPITAL_COMMUNITY): Payer: Medicare Other | Admitting: Speech Pathology

## 2019-04-16 LAB — CBC
HCT: 35.8 % — ABNORMAL LOW (ref 39.0–52.0)
Hemoglobin: 11.8 g/dL — ABNORMAL LOW (ref 13.0–17.0)
MCH: 30 pg (ref 26.0–34.0)
MCHC: 33 g/dL (ref 30.0–36.0)
MCV: 91.1 fL (ref 80.0–100.0)
Platelets: 193 10*3/uL (ref 150–400)
RBC: 3.93 MIL/uL — ABNORMAL LOW (ref 4.22–5.81)
RDW: 14 % (ref 11.5–15.5)
WBC: 11.1 10*3/uL — ABNORMAL HIGH (ref 4.0–10.5)
nRBC: 0 % (ref 0.0–0.2)

## 2019-04-16 NOTE — Progress Notes (Signed)
Occupational Therapy Session Note  Patient Details  Name: Joshua Whitaker MRN: [id number] Date of Birth: 1941/09/17  Today's Date: 04/16/2019 OT Individual Time: 1173-5670 OT Individual Time Calculation (min): 57 min    Short Term Goals: Week 1:  OT Short Term Goal 1 (Week 1): patient will perform UB bathing and dressing with CS and min cues for thoroughness OT Short Term Goal 2 (Week 1): patient will complete LB bathing and dressing with mod A using ADs as needed and min cues for technique OT Short Term Goal 3 (Week 1): patient will complete bed mobility with CS OT Short Term Goal 4 (Week 1): patient will complete SPT with RW min A/CG with good safety  Skilled Therapeutic Interventions/Progress Updates:    1:1. Pt received in bed agreeable to toileting. Pt does not report pain. Pt O2 >98% on 2L throughout session with mobility. Pt completes ambulation with RW and CGA overall (occasional min A sit to stand) with VC for body mechanics/foot/hand placement on RW for transfers. Pt completes toileting with MOD A overall for thorough hygiene. Pt requires increased time to void B&B on BSC over toilet. Pt sits to wash hands demoing decreased attention requiring cues to continue with task. Pt dons shirt with MIN A for head threading and pants with MOD A and VC for sustained attention to task. Exited session with pt seated in bed, exit alarm on and all needs met.  Therapy Documentation Precautions:  Precautions Precautions: Fall Restrictions Weight Bearing Restrictions: No General:   Vital Signs:   Pain:   ADL: ADL Grooming: Moderate assistance Where Assessed-Grooming: Wheelchair Upper Body Bathing: Moderate assistance Where Assessed-Upper Body Bathing: Sitting at sink, Wheelchair Lower Body Bathing: Maximal assistance Where Assessed-Lower Body Bathing: Sitting at sink, Wheelchair, Standing at sink Upper Body Dressing: Moderate assistance Where Assessed-Upper Body Dressing:  Wheelchair Lower Body Dressing: Maximal assistance Where Assessed-Lower Body Dressing: Wheelchair Toileting: Maximal assistance Where Assessed-Toileting: Bedside Commode Toilet Transfer: Moderate assistance Toilet Transfer Method: Stand pivot Toilet Transfer Equipment: Art gallery manager    Praxis   Exercises:   Other Treatments:     Therapy/Group: Individual Therapy  Tonny Branch 04/16/2019, 10:29 AM

## 2019-04-16 NOTE — Progress Notes (Signed)
Speech Language Pathology Daily Session Note  Patient Details  Name: Joshua Whitaker MRN: [id number] Date of Birth: 1941-10-27  Today's Date: 04/16/2019 SLP Individual Time: 0825-0840 SLP Individual Time Calculation (min): 15 min and Today's Date: 04/16/2019 SLP Missed Time: 30 Minutes Missed Time Reason: X-ray  Short Term Goals: Week 1: SLP Short Term Goal 1 (Week 1): Patient will consume current diet of Dys. 1 textures with nectar thick liquids without overt s/sx of aspiration and supervision verbal cues for use of swallowing strategies.  SLP Short Term Goal 2 (Week 1): Patient will complete pharyngeal strengthening exercises with mod A. SLP Short Term Goal 3 (Week 1): Patient will demonstrate functional mastication and oral clearance of dysphagia 2 textures over 2 sessions prior to upgrade with supervision verbal cues. SLP Short Term Goal 4 (Week 1): Patient will consume trials of thin liquids with no overt s/sx of aspriation over 2 sessions with supervision verbal cues to assess readiness for repeat MBS.  Skilled Therapeutic Interventions: Skilled treatment session focused on dysphagia goals. Upon arrival, patient was consuming his breakfast meal of Dys. 1 textures with nectar-thick liquids. Patient consumed meal without overt s/s of aspiration but reported increased shortness of breath. MD aware and chest x-ray ordered with transport present to take patient. Therefore, patient missed remaining 30 minutes of session. Continue with current plan of care.      Pain No/Denies Pain   Therapy/Group: Individual Therapy  Reann Dobias 04/16/2019, 2:57 PM

## 2019-04-16 NOTE — Progress Notes (Signed)
Joshua Whitaker PHYSICAL MEDICINE & REHABILITATION PROGRESS NOTE   Subjective/Complaints: Feels more short of breath this morning. Occasional cough. No wheezing  ROS: Patient denies fever, rash, sore throat, blurred vision, nausea, vomiting, diarrhea,   chest pain, joint or back pain, headache, or mood change.    Objective:   No results found. Recent Labs    04/16/19 0458  WBC 11.1*  HGB 11.8*  HCT 35.8*  PLT 193   No results for input(s): NA, K, CL, CO2, GLUCOSE, BUN, CREATININE, CALCIUM in the last 72 hours.  Intake/Output Summary (Last 24 hours) at 04/16/2019 0817 Last data filed at 04/16/2019 0529 Gross per 24 hour  Intake 610 ml  Output -  Net 610 ml     Physical Exam: Vital Signs Blood pressure 121/66, pulse 82, temperature 98.4 F (36.9 C), temperature source Oral, resp. rate 20, height 5\' 10"  (1.778 m), weight 52.9 kg, SpO2 95 %. Constitutional: No distress . Vital signs reviewed. HEENT: EOMI, oral membranes moist Neck: supple Cardiovascular: RRR without murmur. No JVD    Respiratory: breathing non-labored. No wheezing. Decreased air movement overall    GI: BS +, non-tender, non-distended   Neurological: He is alert. Oriented to person, place, reason. Improving insight and awareness. UE 4/5 prox to distal. LE: 3 to 3+/5 HF, KE and 4/5 distally. Senses pain and light touch in all 4's.motor and sensroy exam stable Skin: Skin iswarm. He isnot diaphoretic.  Psychiatric: Flat but pleasant   Assessment/Plan: 1. Functional deficits secondary to debility/encephalopathy which require 3+ hours per day of interdisciplinary therapy in a comprehensive inpatient rehab setting.  Physiatrist is providing close team supervision and 24 hour management of active medical problems listed below.  Physiatrist and rehab team continue to assess barriers to discharge/monitor patient progress toward functional and medical goals  Care Tool:  Bathing    Body parts bathed by  patient: Chest, Abdomen, Right arm, Left arm, Front perineal area   Body parts bathed by helper: Buttocks     Bathing assist Assist Level: Moderate Assistance - Patient 50 - 74%     Upper Body Dressing/Undressing Upper body dressing   What is the patient wearing?: Pull over shirt    Upper body assist Assist Level: Moderate Assistance - Patient 50 - 74%    Lower Body Dressing/Undressing Lower body dressing    Lower body dressing activity did not occur: N/A What is the patient wearing?: Underwear/pull up, Pants     Lower body assist Assist for lower body dressing: Moderate Assistance - Patient 50 - 74%     Toileting Toileting    Toileting assist Assist for toileting: Maximal Assistance - Patient 25 - 49%     Transfers Chair/bed transfer  Transfers assist     Chair/bed transfer assist level: Minimal Assistance - Patient > 75%     Locomotion Ambulation   Ambulation assist      Assist level: Minimal Assistance - Patient > 75% Assistive device: Walker-rolling Max distance: 20 ft    Walk 10 feet activity   Assist  Walk 10 feet activity did not occur: Safety/medical concerns  Assist level: Minimal Assistance - Patient > 75% Assistive device: Walker-rolling   Walk 50 feet activity   Assist Walk 50 feet with 2 turns activity did not occur: Safety/medical concerns         Walk 150 feet activity   Assist Walk 150 feet activity did not occur: Safety/medical concerns    Assistive device: Walker-rolling    Walk  10 feet on uneven surface  activity   Assist Walk 10 feet on uneven surfaces activity did not occur: Safety/medical concerns         Wheelchair     Assist Will patient use wheelchair at discharge?: Yes Type of Wheelchair: Manual    Wheelchair assist level: Dependent - Patient 0%      Wheelchair 50 feet with 2 turns activity    Assist    Wheelchair 50 feet with 2 turns activity did not occur: Safety/medical  concerns(SOB)       Wheelchair 150 feet activity     Assist Wheelchair 150 feet activity did not occur: Safety/medical concerns(SOB)        Medical Problem List and Plan: 1.Functional and mobility deficitssecondary to debility and encephalopathy after respiratory failure -Continue CIR therapies including PT, OT, and SLP    -exercise stamina remains an issue 2. Antithrombotics: -DVT/anticoagulation:Pharmaceutical:Other (comment)--Eliquis -antiplatelet therapy: Plavix (no ASA) 3. Pain Management:tylenol prn as well as local measures prn. 4. Mood:LCSW to follow for evaluation and support. -antipsychotic agents: N/A 5. Neuropsych: This patientis notcapable of making decisions onhisown behalf. 6. Skin/Wound Care:routine pressure relief measures. 7. Fluids/Electrolytes/Nutrition:   -he is emaciated   -repleting K+  -repleting Mg++ borderline  -recheck labs tomorrow including prealbumin 8. CAD/A Flutter: Continue metoprolol 12.5 mg bid with Apixaban and Plavix.  9. COPD- oxygen dependent: Respiratory status improving. Continue Incruse and Ellipta daily with albuterol prn.   -on home O2 (at night only apparently)  -increased SOB today with cough.   -check cxr 10 Abnormal LFTs: returning to normal.   11. Leukocytosis: 11.1 4/23  -afebrile. No active signs of infection    12. Moderate protein Malnutrition: continue prostat.  . Encourage PO,  - megace trial  -intake remains inconsistent  -labs tomorrow    LOS: 5 days A FACE TO FACE EVALUATION WAS PERFORMED  Meredith Staggers 04/16/2019, 8:17 AM

## 2019-04-17 ENCOUNTER — Inpatient Hospital Stay (HOSPITAL_COMMUNITY): Payer: Medicare Other | Admitting: Speech Pathology

## 2019-04-17 ENCOUNTER — Inpatient Hospital Stay (HOSPITAL_COMMUNITY): Payer: Medicare Other | Admitting: Physical Therapy

## 2019-04-17 ENCOUNTER — Ambulatory Visit: Payer: Medicare Other | Admitting: Family Medicine

## 2019-04-17 ENCOUNTER — Inpatient Hospital Stay (HOSPITAL_COMMUNITY): Payer: Medicare Other

## 2019-04-17 LAB — COMPREHENSIVE METABOLIC PANEL
ALT: 42 U/L (ref 0–44)
AST: 21 U/L (ref 15–41)
Albumin: 2.3 g/dL — ABNORMAL LOW (ref 3.5–5.0)
Alkaline Phosphatase: 65 U/L (ref 38–126)
Anion gap: 12 (ref 5–15)
BUN: 22 mg/dL (ref 8–23)
CO2: 21 mmol/L — ABNORMAL LOW (ref 22–32)
Calcium: 8.5 mg/dL — ABNORMAL LOW (ref 8.9–10.3)
Chloride: 103 mmol/L (ref 98–111)
Creatinine, Ser: 0.62 mg/dL (ref 0.61–1.24)
GFR calc Af Amer: >60 mL/min (ref >60)
GFR calc non Af Amer: >60 mL/min (ref >60)
Glucose, Bld: 103 mg/dL — ABNORMAL HIGH (ref 70–99)
Potassium: 3.9 mmol/L (ref 3.5–5.1)
Sodium: 136 mmol/L (ref 135–145)
Total Bilirubin: 0.8 mg/dL (ref 0.3–1.2)
Total Protein: 5.5 g/dL — ABNORMAL LOW (ref 6.5–8.1)

## 2019-04-17 LAB — PREALBUMIN: Prealbumin: 13.1 mg/dL — ABNORMAL LOW (ref 18–38)

## 2019-04-17 NOTE — Progress Notes (Signed)
Occupational Therapy Session Note  Patient Details  Name: Joshua Whitaker MRN: [id number] Date of Birth: [birth year]  Today's Date: 04/17/2019 OT Individual Time: 0700-0745 OT Individual Time Calculation (min): 45 min    Short Term Goals: Week 1:  OT Short Term Goal 1 (Week 1): patient will perform UB bathing and dressing with CS and min cues for thoroughness OT Short Term Goal 2 (Week 1): patient will complete LB bathing and dressing with mod A using ADs as needed and min cues for technique OT Short Term Goal 3 (Week 1): patient will complete bed mobility with CS OT Short Term Goal 4 (Week 1): patient will complete SPT with RW min A/CG with good safety  Skilled Therapeutic Interventions/Progress Updates:    1:1. Pt received in bed agreeable to OT with no c/o pain. OT EOB >93% on RA. Pt completes eating EOB seated with S and VC for smaller bites/sips according to SLP plan. No overt s/s of aspiration. Pt completes bathing seated EOB with S for initation/sequencing of dressing/bathing tasks. Pt requires min VC for pulling shirt sleeve past elbows and dons shirt with S. O2 applied for LB dressing. Pt dons pants with MOD A overall and MIN A sit to stand for advancing pants past hips. Exited sessio with pt seated in bed, exit alarm on and call light inreach  Therapy Documentation Precautions:  Precautions Precautions: Fall Restrictions Weight Bearing Restrictions: No General:   Vital Signs: Therapy Vitals Resp: 20 Oxygen Therapy SpO2: 98 % O2 Device: Nasal Cannula O2 Flow Rate (L/min): 2 L/min Pain:   ADL: ADL Grooming: Moderate assistance Where Assessed-Grooming: Wheelchair Upper Body Bathing: Moderate assistance Where Assessed-Upper Body Bathing: Sitting at sink, Wheelchair Lower Body Bathing: Maximal assistance Where Assessed-Lower Body Bathing: Sitting at sink, Wheelchair, Standing at sink Upper Body Dressing: Moderate assistance Where Assessed-Upper Body Dressing:  Wheelchair Lower Body Dressing: Maximal assistance Where Assessed-Lower Body Dressing: Wheelchair Toileting: Maximal assistance Where Assessed-Toileting: Bedside Commode Toilet Transfer: Moderate assistance Toilet Transfer Method: Stand pivot Toilet Transfer Equipment: Art gallery manager    Praxis   Exercises:   Other Treatments:     Therapy/Group: Individual Therapy  Tonny Branch 04/17/2019, 7:24 AM

## 2019-04-17 NOTE — Progress Notes (Addendum)
Physical Therapy Weekly Progress Note  Patient Details  Name: Joshua Whitaker MRN: [id number] Date of Birth: 01/31/1941  Beginning of progress report period: April 11, 2019 End of progress report period: April 17, 2019  Today's Date: 04/17/2019 PT Individual Time: 0175-1025 PT Individual Time Calculation (min): 38 min   Patient has met 2 of 4 short term goals.  Pt is making very slow progress as he is limited by poor activity tolerance and endurance. Pt is capable of ambulating short distances (20 ft or less) with RW & min assist and requires min assist overall for bed mobility & transfers. Pt demonstrates impaired awareness and initiation. Pt would benefit from continued skilled PT treatment to focus on bed mobility, transfers, gait, stair negotiation for strengthening, endurance, balance, and awareness. Pt's schedule is currently 15/7.   Patient continues to demonstrate the following deficits muscle weakness, decreased cardiorespiratory endurance and decreased oxygen support, decreased coordination 2/2 muscle weakness, decreased initiation, decreased attention, decreased awareness, decreased problem solving, decreased safety awareness and decreased memory, and decreased standing balance, decreased postural control and decreased balance strategies and therefore will continue to benefit from skilled PT intervention to increase functional independence with mobility.  Patient progressing toward long term goals..  Continue plan of care.  PT Short Term Goals Week 1:  PT Short Term Goal 1 (Week 1): pt will move supine> sit with min assist PT Short Term Goal 1 - Progress (Week 1): Met PT Short Term Goal 2 (Week 1): pt will sit> stand with min assist PT Short Term Goal 2 - Progress (Week 1): Met PT Short Term Goal 3 (Week 1): pt will propel w/c x 65' with supervision PT Short Term Goal 3 - Progress (Week 1): Not met PT Short Term Goal 4 (Week 1): pt will perform gait with LRAD x 25' PT Short  Term Goal 4 - Progress (Week 1): Progressing toward goal Week 2:  PT Short Term Goal 1 (Week 2): Pt will negotiate 4 steps with B rails & mod assist for BLE strengthening.  PT Short Term Goal 2 (Week 2): Pt willl ambulate 50 ft with LRAD & min assist.  PT Short Term Goal 3 (Week 2): Pt will complete bed mobility with min assist.   Skilled Therapeutic Interventions/Progress Updates:  Pt received in bed with nasal cannula off - therapist donned nasal cannula & educated pt on purpose of wearing it. SpO2 then = 92%, & HR = 82 bpm, pt remained on 2L/min supplemental oxygen throughout session with SpO2 remaining >90%. Pt transfers to sitting EOB with hospital bed features & supervision. Pt c/o back itching so therapist applied lotion with pt reporting this helps. Pt tranfers bed>w/c via stand pivot without AD and supervision. Pt performs hand hygiene at sink from w/c level with supervision. In gym, pt negotiates 4 steps (6") with B rails and min assist with pt self selecting gait pattern but requiring cuing to descend steps vs resting at top of stairs. Back in room, therapist provided total assist for donning fresh brief as pt with small incontinent BM smear. At end of session pt left sitting in recliner with BLE elevated, chair alarm donned & all needs in reach.   Therapy Documentation Precautions:  Precautions Precautions: Fall Restrictions Weight Bearing Restrictions: No     Therapy/Group: Individual Therapy  Waunita Schooner 04/17/2019, 2:18 PM

## 2019-04-17 NOTE — Progress Notes (Signed)
Speech Language Pathology Daily Session Note  Patient Details  Name: Joshua Whitaker MRN: [id number] Date of Birth: 1941/04/18  Today's Date: 04/17/2019 SLP Individual Time: 0930-1010 SLP Individual Time Calculation (min): 40 min  Short Term Goals: Week 2: SLP Short Term Goal 1 (Week 2): Patient will complete pharyngeal strengthening exercises with mod A. SLP Short Term Goal 2 (Week 2): Patient will demonstrate functional mastication and oral clearance of dysphagia 2 textures over 2 sessions prior to upgrade with supervision verbal cues. SLP Short Term Goal 3 (Week 2): Patient will consume trials of thin liquids with no overt s/sx of aspriation over 2 sessions with supervision verbal cues to assess readiness for repeat MBS. SLP Short Term Goal 4 (Week 2): Patient will consume current diet of Dys. 1 textures with nectar thick liquids without overt s/sx of aspiration with Mod I for use of swallowing compensatory strategies.   Skilled Therapeutic Interventions:  Skilled treatment session focused on dysphagia and education. SLP received pt in bed, awake but unmotivated to "do much of anything." He states that he "just feels lazy today." SLP provided skilled observation of pt consuming a total of 8 oz of thin water via straw with no overt s/s of aspiraton, however pt is known silent aspirator. Pt unmotivated to perform pharyngeal strengthening exercises. Education provided on need to perform I order to improve swallow function. MBS scheduled for 04/20/19 to assess for possibility of upgrade to thin liquids.      Pain Pain Assessment Pain Scale: Faces Faces Pain Scale: No hurt  Therapy/Group: Individual Therapy  [name] 04/17/2019, 5:05 PM

## 2019-04-17 NOTE — Progress Notes (Signed)
Speech Language Pathology Weekly Progress Note  Patient Details  Name: Joshua Whitaker MRN: [id number] Date of Birth: [birth year]  Beginning of progress report period: April 10, 2019 End of progress report period: March 17, 2019  Short Term Goals: Week 1: SLP Short Term Goal 1 (Week 1): Patient will consume current diet of Dys. 1 textures with nectar thick liquids without overt s/sx of aspiration and supervision verbal cues for use of swallowing strategies.  SLP Short Term Goal 1 - Progress (Week 1): Met SLP Short Term Goal 2 (Week 1): Patient will complete pharyngeal strengthening exercises with mod A. SLP Short Term Goal 2 - Progress (Week 1): Not met SLP Short Term Goal 3 (Week 1): Patient will demonstrate functional mastication and oral clearance of dysphagia 2 textures over 2 sessions prior to upgrade with supervision verbal cues. SLP Short Term Goal 3 - Progress (Week 1): Not met SLP Short Term Goal 4 (Week 1): Patient will consume trials of thin liquids with no overt s/sx of aspriation over 2 sessions with supervision verbal cues to assess readiness for repeat MBS. SLP Short Term Goal 4 - Progress (Week 1): Not met    New Short Term Goals: Week 2: SLP Short Term Goal 1 (Week 2): Patient will complete pharyngeal strengthening exercises with mod A. SLP Short Term Goal 2 (Week 2): Patient will demonstrate functional mastication and oral clearance of dysphagia 2 textures over 2 sessions prior to upgrade with supervision verbal cues. SLP Short Term Goal 3 (Week 2): Patient will consume trials of thin liquids with no overt s/sx of aspriation over 2 sessions with supervision verbal cues to assess readiness for repeat MBS. SLP Short Term Goal 4 (Week 2): Patient will consume current diet of Dys. 1 textures with nectar thick liquids without overt s/sx of aspiration with Mod I for use of swallowing compensatory strategies.   Weekly Progress Updates: Patient had made minimal gains this  reprting period and has met 1 of 4 STGs. Patient's progress has been limited by fatigue and decreased participation required overall Max A to perform pharyngeal strengthening exercises and Max encouragement to consume trials of advanced solids/liquids. Hopeful as patient's strength and endurance improves patient's overall participation will improve as well. Anticipate patient will be ready for repeat MBS this upcoming week.      Intensity: Minumum of 1-2 x/day, 30 to 90 minutes Frequency: 3 to 5 out of 7 days Duration/Length of Stay: 3 weeks  Treatment/Interventions: Dysphagia/aspiration precaution training;Patient/family education;Therapeutic Exercise;Therapeutic Activities;Functional tasks;Cueing hierarchy;Environmental controls;Internal/external aids    Tiandra Swoveland 04/17/2019, 3:30 PM

## 2019-04-17 NOTE — Progress Notes (Signed)
[facility] PHYSICAL MEDICINE & REHABILITATION PROGRESS NOTE   Subjective/Complaints: No new problems. Still sob at times and fatigues easily  ROS: Patient denies fever, rash, sore throat, blurred vision, nausea, vomiting, diarrhea,   joint or back pain, headache, or mood change.   Objective:   Dg Chest 2 View  Result Date: 04/16/2019 CLINICAL DATA:  78 year old male with shortness of breath and cough. Negative for COVID-19 on 04/01/2019. EXAM: CHEST - 2 VIEW COMPARISON:  04/09/2019 and earlier. FINDINGS: Upright AP and lateral views of the chest. Chronic pulmonary hyperinflation. Calcified aortic atherosclerosis. Stable cardiac size and mediastinal contours. Chronic architectural distortion in the peripheral left upper lobe. No pneumothorax, pulmonary edema, pleural effusion or acute pulmonary opacity identified. Osteopenia. No acute osseous abnormality identified. IMPRESSION: Chronic lung disease with Emphysema (ICD10-J43.9). No acute cardiopulmonary abnormality identified. Aortic Atherosclerosis (ICD10-I70.0). Electronically Signed   By: Genevie Ann M.D.   On: 04/16/2019 09:12   Recent Labs    04/16/19 0458  WBC 11.1*  HGB 11.8*  HCT 35.8*  PLT 193   No results for input(s): NA, K, CL, CO2, GLUCOSE, BUN, CREATININE, CALCIUM in the last 72 hours.  Intake/Output Summary (Last 24 hours) at 04/17/2019 1003 Last data filed at 04/16/2019 1300 Gross per 24 hour  Intake 360 ml  Output -  Net 360 ml     Physical Exam: Vital Signs Blood pressure (!) 124/56, pulse 75, temperature 97.9 F (36.6 C), temperature source Oral, resp. rate 20, height 5\' 10"  (1.778 m), weight 52.9 kg, SpO2 93 %. Constitutional: No distress . Vital signs reviewed. HEENT: EOMI, oral membranes moist Neck: supple Cardiovascular: RRR without murmur. No JVD    Respiratory: CTA Bilaterally without wheezes or rales. Normal effort, decreased air movement overall  GI: BS +, non-tender, non-distended  Neurological: He is  alert. Fair insight and awareness.  UE 4/5 prox to distal. LE: 3 to 3+/5 HF, KE and 4/5 distally. Senses pain and light touch in all 4's.no motor changes today Skin: Skin iswarm. He isnot diaphoretic.  Psychiatric: flat, cooeprative   Assessment/Plan: 1. Functional deficits secondary to debility/encephalopathy which require 3+ hours per day of interdisciplinary therapy in a comprehensive inpatient rehab setting.  Physiatrist is providing close team supervision and 24 hour management of active medical problems listed below.  Physiatrist and rehab team continue to assess barriers to discharge/monitor patient progress toward functional and medical goals  Care Tool:  Bathing    Body parts bathed by patient: Chest, Abdomen, Right arm, Left arm, Front perineal area   Body parts bathed by helper: Buttocks     Bathing assist Assist Level: Moderate Assistance - Patient 50 - 74%     Upper Body Dressing/Undressing Upper body dressing   What is the patient wearing?: Pull over shirt    Upper body assist Assist Level: Minimal Assistance - Patient > 75%    Lower Body Dressing/Undressing Lower body dressing    Lower body dressing activity did not occur: N/A What is the patient wearing?: Pants     Lower body assist Assist for lower body dressing: Moderate Assistance - Patient 50 - 74%     Toileting Toileting    Toileting assist Assist for toileting: Moderate Assistance - Patient 50 - 74%     Transfers Chair/bed transfer  Transfers assist     Chair/bed transfer assist level: Minimal Assistance - Patient > 75%     Locomotion Ambulation   Ambulation assist      Assist level: Minimal Assistance -  Patient > 75% Assistive device: Walker-rolling Max distance: 20 ft    Walk 10 feet activity   Assist  Walk 10 feet activity did not occur: Safety/medical concerns  Assist level: Minimal Assistance - Patient > 75% Assistive device: Walker-rolling   Walk 50 feet  activity   Assist Walk 50 feet with 2 turns activity did not occur: Safety/medical concerns         Walk 150 feet activity   Assist Walk 150 feet activity did not occur: Safety/medical concerns    Assistive device: Walker-rolling    Walk 10 feet on uneven surface  activity   Assist Walk 10 feet on uneven surfaces activity did not occur: Safety/medical concerns         Wheelchair     Assist Will patient use wheelchair at discharge?: Yes Type of Wheelchair: Manual    Wheelchair assist level: Dependent - Patient 0%      Wheelchair 50 feet with 2 turns activity    Assist    Wheelchair 50 feet with 2 turns activity did not occur: Safety/medical concerns(SOB)       Wheelchair 150 feet activity     Assist Wheelchair 150 feet activity did not occur: Safety/medical concerns(SOB)        Medical Problem List and Plan: 1.Functional and mobility deficitssecondary to debility and encephalopathy after respiratory failure --Continue CIR therapies including PT, OT, and SLP  2. Antithrombotics: -DVT/anticoagulation:Pharmaceutical:Other (comment)--Eliquis -antiplatelet therapy: Plavix (no ASA) 3. Pain Management:tylenol prn as well as local measures prn. 4. Mood:LCSW to follow for evaluation and support. -antipsychotic agents: N/A 5. Neuropsych: This patientis notcapable of making decisions onhisown behalf. 6. Skin/Wound Care:routine pressure relief measures. 7. Fluids/Electrolytes/moderate protein deficient malnutrition:   -intake better yesterday   -continue megace   -repleting K+  -repleting Mg++ borderline  - check cmet, mg++ today 8. CAD/A Flutter: Continue metoprolol 12.5 mg bid with Apixaban and Plavix.  9. COPD- oxygen dependent: Respiratory status improving. Continue Incruse and Ellipta daily with albuterol prn.   -on home O2 (at night only apparently)  -cxr reviewed personally and without  acute disease 10 Abnormal LFTs: returning to normal.   11. Leukocytosis: 11.1 4/23  -afebrile. No active signs of infection        LOS: 6 days A FACE TO FACE EVALUATION WAS PERFORMED  Meredith Staggers 04/17/2019, 10:03 AM

## 2019-04-18 ENCOUNTER — Inpatient Hospital Stay (HOSPITAL_COMMUNITY): Payer: Medicare Other

## 2019-04-18 ENCOUNTER — Inpatient Hospital Stay (HOSPITAL_COMMUNITY): Payer: Medicare Other | Admitting: Physical Therapy

## 2019-04-18 DIAGNOSIS — G9340 Encephalopathy, unspecified: Secondary | ICD-10-CM

## 2019-04-18 NOTE — Progress Notes (Signed)
[place] PHYSICAL MEDICINE & REHABILITATION PROGRESS NOTE   Subjective/Complaints: Non productive cough when moving sounds  A little wet Oriented to day and rehab but thinks he is in [place]  ROS: Patient denies CP, SOB, N/V/D  Objective:   No results found. Recent Labs    04/16/19 0458  WBC 11.1*  HGB 11.8*  HCT 35.8*  PLT 193   Recent Labs    04/17/19 1030  NA 136  K 3.9  CL 103  CO2 21*  GLUCOSE 103*  BUN 22  CREATININE 0.62  CALCIUM 8.5*    Intake/Output Summary (Last 24 hours) at 04/18/2019 1002 Last data filed at 04/18/2019 0625 Gross per 24 hour  Intake 354 ml  Output 150 ml  Net 204 ml     Physical Exam: Vital Signs Blood pressure 117/60, pulse 82, temperature 98.3 F (36.8 C), temperature source Oral, resp. rate 17, height 5\' 10"  (1.778 m), weight 52.3 kg, SpO2 92 %. Constitutional: No distress . Vital signs reviewed. HEENT: EOMI, oral membranes moist Neck: supple Cardiovascular: RRR without murmur. No JVD    Respiratory: CTA Bilaterally without wheezes or rales. Normal effort, decreased air movement overall  GI: BS +, non-tender, non-distended  Neurological: He is alert. Fair insight and awareness.  UE 4/5 prox to distal. LE: 3 to 3+/5 HF, KE and 4/5 distally. no motor changes today Skin: Skin iswarm. He isnot diaphoretic.  Psychiatric: flat, cooeprative   Assessment/Plan: 1. Functional deficits secondary to debility/encephalopathy which require 3+ hours per day of interdisciplinary therapy in a comprehensive inpatient rehab setting.  Physiatrist is providing close team supervision and 24 hour management of active medical problems listed below.  Physiatrist and rehab team continue to assess barriers to discharge/monitor patient progress toward functional and medical goals  Care Tool:  Bathing    Body parts bathed by patient: Chest, Abdomen, Right arm, Left arm, Face   Body parts bathed by helper: Buttocks     Bathing assist  Assist Level: Supervision/Verbal cueing     Upper Body Dressing/Undressing Upper body dressing   What is the patient wearing?: Pull over shirt    Upper body assist Assist Level: Supervision/Verbal cueing    Lower Body Dressing/Undressing Lower body dressing    Lower body dressing activity did not occur: N/A What is the patient wearing?: Pants     Lower body assist Assist for lower body dressing: Moderate Assistance - Patient 50 - 74%     Toileting Toileting    Toileting assist Assist for toileting: Moderate Assistance - Patient 50 - 74%     Transfers Chair/bed transfer  Transfers assist     Chair/bed transfer assist level: Supervision/Verbal cueing     Locomotion Ambulation   Ambulation assist      Assist level: Minimal Assistance - Patient > 75% Assistive device: Walker-rolling Max distance: 20 ft    Walk 10 feet activity   Assist  Walk 10 feet activity did not occur: Safety/medical concerns  Assist level: Minimal Assistance - Patient > 75% Assistive device: Walker-rolling   Walk 50 feet activity   Assist Walk 50 feet with 2 turns activity did not occur: Safety/medical concerns         Walk 150 feet activity   Assist Walk 150 feet activity did not occur: Safety/medical concerns    Assistive device: Walker-rolling    Walk 10 feet on uneven surface  activity   Assist Walk 10 feet on uneven surfaces activity did not occur: Safety/medical concerns  Wheelchair     Assist Will patient use wheelchair at discharge?: Yes Type of Wheelchair: Manual    Wheelchair assist level: Dependent - Patient 0%      Wheelchair 50 feet with 2 turns activity    Assist    Wheelchair 50 feet with 2 turns activity did not occur: Safety/medical concerns(SOB)       Wheelchair 150 feet activity     Assist Wheelchair 150 feet activity did not occur: Safety/medical concerns(SOB)        Medical Problem List and  Plan: 1.Functional and mobility deficitssecondary to debility and encephalopathy after respiratory failure --Continue CIR therapies including PT, OT, and SLP  2. Antithrombotics: -DVT/anticoagulation:Pharmaceutical:Other (comment)--Eliquis -antiplatelet therapy: Plavix (no ASA) 3. Pain Management:tylenol prn as well as local measures prn. 4. Mood:LCSW to follow for evaluation and support. -antipsychotic agents: N/A 5. Neuropsych: This patientis notcapable of making decisions onhisown behalf. 6. Skin/Wound Care:routine pressure relief measures. 7. Fluids/Electrolytes/moderate protein deficient malnutrition: prealb low 13.1, alb low 2.3   -intake better yesterday   -continue megace   -repleting K+, K+ nl on 4/24  -repleting Mg++ borderline repeat not done    8. CAD/A Flutter: Continue metoprolol 12.5 mg bid with Apixaban and Plavix.  Vitals:   04/18/19 0447 04/18/19 0804  BP: 117/60   Pulse: 82   Resp: 17   Temp: 98.3 F (36.8 C)   SpO2: 97% 92%  rate controlled 4/25 9. COPD- oxygen dependent: Respiratory status improving. Continue Incruse and Ellipta daily with albuterol prn.   -on home O2 (at night only apparently)  -cxr reviewed personally and without acute disease 10 Abnormal LFTs: returning to normal.   11. Leukocytosis: 11.1 4/23  -afebrile. No active signs of infection 12.  Decreased pulmonary toilet add flutter valve       LOS: 7 days A FACE TO FACE EVALUATION WAS PERFORMED  Charlett Blake 04/18/2019, 10:02 AM

## 2019-04-18 NOTE — Progress Notes (Signed)
Attempted to work with patient, upon entering room noted nasal cannula were off of patient and he was very short of breath/with labored breathing just with conversation. Replaced cannula on 2LPM O2 and sats recovered to 95%. Made multiple attempts and modifications to session to attempt to convince patient to participate, including offering bed exercise, in room session,and turning up heat in room (he stated "I will freeze to death if I get out of bed"). Unable to convince patient to participate, plan to re-attempt soon.  Deniece Ree PT, DPT, CBIS  Supplemental Physical Therapist [facility]    Pager [phone] Acute Rehab Office [phone]

## 2019-04-18 NOTE — Progress Notes (Signed)
Occupational Therapy Weekly Progress Note  Patient Details  Name: Joshua Whitaker MRN: [id number] Date of Birth: [birth year]  Beginning of progress report period: April 11, 2019 End of progress report period: April 18, 2019  Today's Date: 04/18/2019 OT Individual Time: 9476-5465 OT Individual Time Calculation (min): 75 min    Patient has met 4 of 4 short term goals.  Pt is making slow but steady progress this reporting period d/t decreased participation, initiation and self limiting behaviors. Pt is currently at min-CGA for mobility throughout ADLs requiring VC for body mechanics and initiation of mobility. Pt supervision for UB ADLs and MIN A for LB ADLs. Pt currently on 2L O2 with sats >95% with mobility but pt continues to complaining of SOB with activity. Pt participtation has slowly improved throughout reporting period despite saying, "im just being lazy."  Patient continues to demonstrate the following deficits: muscle weakness, decreased cardiorespiratoy endurance and decreased oxygen support, decreased initiation, decreased attention, decreased awareness, decreased problem solving, decreased safety awareness, decreased memory and delayed processing and decreased standing balance, decreased postural control and decreased balance strategies and therefore will continue to benefit from skilled OT intervention to enhance overall performance with BADL.  Patient progressing toward long term goals..  Continue plan of care.  OT Short Term Goals Week 1:  OT Short Term Goal 1 (Week 1): patient will perform UB bathing and dressing with CS and min cues for thoroughness OT Short Term Goal 1 - Progress (Week 1): Met OT Short Term Goal 2 (Week 1): patient will complete LB bathing and dressing with mod A using ADs as needed and min cues for technique OT Short Term Goal 2 - Progress (Week 1): Met OT Short Term Goal 3 (Week 1): patient will complete bed mobility with CS OT Short Term Goal 3 - Progress  (Week 1): Met OT Short Term Goal 4 (Week 1): patient will complete SPT with RW min A/CG with good safety OT Short Term Goal 4 - Progress (Week 1): Met Week 2:  OT Short Term Goal 1 (Week 2): stg=ltg d/t ELOS  Skilled Therapeutic Interventions/Progress Updates:    1:1. Pt received in bed with no c/o pain. Pt initially resistant to tx, however agreeable after encouragement. Ceiling begins leaking and RN alerted to call facilities. This was good motivation for pt to put on pants in timely fashion. Supine>sit with supervision and stand pivot transfer to w/c with min A. Pt dons pants iwht MIN A overall for balance in standing. Pt completes standing trials at high low table while completing connect 4 game on 1L O2 with CGA. Pt completes standing trials with increasing length 25s, 1 min 18s, 2 min 22s, and 1 min 35 seconds. Pt O2 on rest breaks 97% immediately after sitting however measuring on during last standing trial pt dropped to high 80s rebounding to >95% with sitting. Pt eats breakfast at high low table with VC for small bites and sips throughout meal. Exited session with pt seated in bed, exit alarm on and all needs met.  Therapy Documentation Precautions:  Precautions Precautions: Fall Restrictions Weight Bearing Restrictions: No General:   Vital Signs: Therapy Vitals Temp: 98.3 F (36.8 C) Temp Source: Oral Pulse Rate: 82 Resp: 17 BP: 117/60 Patient Position (if appropriate): Lying Oxygen Therapy SpO2: 97 % O2 Device: Room Air Pain:   ADL: ADL Grooming: Moderate assistance Where Assessed-Grooming: Wheelchair Upper Body Bathing: Moderate assistance Where Assessed-Upper Body Bathing: Sitting at sink, Wheelchair Lower Body Bathing: Maximal assistance  Where Assessed-Lower Body Bathing: Sitting at sink, Wheelchair, Standing at sink Upper Body Dressing: Moderate assistance Where Assessed-Upper Body Dressing: Wheelchair Lower Body Dressing: Maximal assistance Where  Assessed-Lower Body Dressing: Wheelchair Toileting: Maximal assistance Where Assessed-Toileting: Bedside Commode Toilet Transfer: Moderate assistance Toilet Transfer Method: Stand pivot Toilet Transfer Equipment: Art gallery manager    Praxis   Exercises:   Other Treatments:     Therapy/Group: Individual Therapy  Tonny Branch 04/18/2019, 6:59 AM

## 2019-04-18 NOTE — Progress Notes (Signed)
Physical Therapy Session Note  Patient Details  Name: Joshua Whitaker MRN: [id number] Date of Birth: [birth year]  Today's Date: 04/18/2019 PT Individual Time: 1510-1525 PT Individual Time Calculation (min): 15 min   Short Term Goals: Week 2:  PT Short Term Goal 1 (Week 2): Pt will negotiate 4 steps with B rails & mod assist for BLE strengthening.  PT Short Term Goal 2 (Week 2): Pt willl ambulate 50 ft with LRAD & min assist.  PT Short Term Goal 3 (Week 2): Pt will complete bed mobility with min assist.   Skilled Therapeutic Interventions/Progress Updates:     Patient received in bed, asleep but easily woken and willing to participate in session, states "I'd like to change my scrubs". SpO2 at rest 98% on 2LPM, continues to have ongoing labored breathing. Performed supine to sit with ModA and extended time, once up at EOB patient refused to continue with changing clothing and laid back down, declined therapist attempts at any other activities. He was repositioned in bed with totalA and positioned to comfort, all other needs met and bed alarm active. 45 minutes of skilled PT time missed due to patient refusal.   Therapy Documentation Precautions:  Precautions Precautions: Fall Restrictions Weight Bearing Restrictions: No General: PT Amount of Missed Time (min): 45 Minutes PT Missed Treatment Reason: Patient unwilling to participate;Patient fatigue Pain: Pain Assessment Pain Scale: 0-10 Pain Score: 0-No pain    Therapy/Group: Individual Therapy   Deniece Ree PT, DPT, CBIS  Supplemental Physical Therapist [facility]    Pager [phone] Acute Rehab Office [phone]    04/18/2019, 3:34 PM

## 2019-04-19 ENCOUNTER — Inpatient Hospital Stay (HOSPITAL_COMMUNITY): Payer: Medicare Other | Admitting: Physical Therapy

## 2019-04-19 ENCOUNTER — Inpatient Hospital Stay (HOSPITAL_COMMUNITY): Payer: Medicare Other

## 2019-04-19 NOTE — Progress Notes (Signed)
Occupational Therapy Session Note  Patient Details  Name: Joshua Whitaker MRN: [id number] Date of Birth: 1941-11-29  Today's Date: 04/19/2019 OT Individual Time: 0900-1000 OT Individual Time Calculation (min): 60 min    Short Term Goals: Week 2:  OT Short Term Goal 1 (Week 2): stg=ltg d/t ELOS  Skilled Therapeutic Interventions/Progress Updates:    Pt supine, lethargic initially with no c/o pain. Skilled monitoring of hemodynamic stability throughout session with overall hypotension- MD and RN aware. Pt on 2L [place] throughout session.Thigh high teds donned at bed level following 95/43 reading supine. Once EOB BP was initially 70/56 but with 1 min EOB sitting, rose to 100/46. Pt asymptomatic throughout session. Following bathing EOB pt's BP 111/52. Pt able to complete sit > stand from EOB slightly raised with CGA this session. Min A for brief management and CGA for standing balance during peri hygiene. Pt more talkative this session than usual and making several jokes to therapist. Pt reported needing to have BM and completed functional mobility to bathroom with CGA. Min A for posterior peri hygiene d/t pt becoming fatigued.  Pt returned to supine and left with all needs met, bed alarm set.   Therapy Documentation Precautions:  Precautions Precautions: Fall Restrictions Weight Bearing Restrictions: No Pain:  No pain reported.    Therapy/Group: Individual Therapy  Curtis Sites 04/19/2019, 7:13 AM

## 2019-04-19 NOTE — Progress Notes (Signed)
[place] PHYSICAL MEDICINE & REHABILITATION PROGRESS NOTE   Subjective/Complaints: Discussed BP drop with OT and RN Oriented to day and rehab but thinks he is in [employer]  ROS: Patient denies CP, SOB, N/V/D  Objective:   No results found. No results for input(s): WBC, HGB, HCT, PLT in the last 72 hours. Recent Labs    04/17/19 1030  NA 136  K 3.9  CL 103  CO2 21*  GLUCOSE 103*  BUN 22  CREATININE 0.62  CALCIUM 8.5*    Intake/Output Summary (Last 24 hours) at 04/19/2019 0931 Last data filed at 04/19/2019 0400 Gross per 24 hour  Intake 914 ml  Output -  Net 914 ml     Physical Exam: Vital Signs Blood pressure (!) 109/52, pulse 90, temperature (!) 97.5 F (36.4 C), temperature source Oral, resp. rate 19, height 5\' 10"  (1.778 m), weight 53 kg, SpO2 100 %. Constitutional: No distress . Vital signs reviewed. HEENT: EOMI, oral membranes moist Neck: supple Cardiovascular: RRR without murmur. No JVD    Respiratory: CTA Bilaterally without wheezes or rales. Normal effort, decreased air movement overall  GI: BS +, non-tender, non-distended  Neurological: He is alert. Fair insight and awareness.  UE 4/5 prox to distal. LE: 3 to 3+/5 HF, KE and 4/5 distally. no motor changes today Skin: Skin iswarm. He isnot diaphoretic.  Psychiatric: flat, cooeprative   Assessment/Plan: 1. Functional deficits secondary to debility/encephalopathy which require 3+ hours per day of interdisciplinary therapy in a comprehensive inpatient rehab setting.  Physiatrist is providing close team supervision and 24 hour management of active medical problems listed below.  Physiatrist and rehab team continue to assess barriers to discharge/monitor patient progress toward functional and medical goals  Care Tool:  Bathing    Body parts bathed by patient: Chest, Abdomen, Right arm, Left arm, Face   Body parts bathed by helper: Buttocks     Bathing assist Assist Level: Supervision/Verbal  cueing     Upper Body Dressing/Undressing Upper body dressing   What is the patient wearing?: Pull over shirt    Upper body assist Assist Level: Supervision/Verbal cueing    Lower Body Dressing/Undressing Lower body dressing    Lower body dressing activity did not occur: N/A What is the patient wearing?: Pants     Lower body assist Assist for lower body dressing: Moderate Assistance - Patient 50 - 74%     Toileting Toileting    Toileting assist Assist for toileting: Moderate Assistance - Patient 50 - 74%     Transfers Chair/bed transfer  Transfers assist     Chair/bed transfer assist level: Supervision/Verbal cueing     Locomotion Ambulation   Ambulation assist      Assist level: Minimal Assistance - Patient > 75% Assistive device: Walker-rolling Max distance: 20 ft    Walk 10 feet activity   Assist  Walk 10 feet activity did not occur: Safety/medical concerns  Assist level: Minimal Assistance - Patient > 75% Assistive device: Walker-rolling   Walk 50 feet activity   Assist Walk 50 feet with 2 turns activity did not occur: Safety/medical concerns         Walk 150 feet activity   Assist Walk 150 feet activity did not occur: Safety/medical concerns    Assistive device: Walker-rolling    Walk 10 feet on uneven surface  activity   Assist Walk 10 feet on uneven surfaces activity did not occur: Safety/medical concerns         Wheelchair  Assist Will patient use wheelchair at discharge?: Yes Type of Wheelchair: Manual    Wheelchair assist level: Dependent - Patient 0%      Wheelchair 50 feet with 2 turns activity    Assist    Wheelchair 50 feet with 2 turns activity did not occur: Safety/medical concerns(SOB)       Wheelchair 150 feet activity     Assist Wheelchair 150 feet activity did not occur: Safety/medical concerns(SOB)        Medical Problem List and Plan: 1.Functional and mobility  deficitssecondary to debility and encephalopathy after respiratory failure --Continue CIR therapies including PT, OT, and SLP  2. Antithrombotics: -DVT/anticoagulation:Pharmaceutical:Other (comment)--Eliquis -antiplatelet therapy: Plavix (no ASA) 3. Pain Management:tylenol prn as well as local measures prn. 4. Mood:LCSW to follow for evaluation and support. -antipsychotic agents: N/A 5. Neuropsych: This patientis notcapable of making decisions onhisown behalf. 6. Skin/Wound Care:routine pressure relief measures. 7. Fluids/Electrolytes/moderate protein deficient malnutrition: prealb low 13.1, alb low 2.3   -intake better yesterday   -continue megace   -repleting K+, K+ nl on 4/24  -repleting Mg++ borderline repeat not done    8. CAD/A Flutter: Continue metoprolol 12.5 mg bid with Apixaban and Plavix.  Vitals:   04/18/19 1913 04/19/19 0322  BP: (!) 102/46 (!) 109/52  Pulse: 99 90  Resp: 18 19  Temp: 98 F (36.7 C) (!) 97.5 F (36.4 C)  SpO2: 99% 100%  rate controlled 4/26, had orthostatic drop with therapy but asympomatic now has thigh hi teds 9. COPD- oxygen dependent: Respiratory status improving. Continue Incruse and Ellipta daily with albuterol prn.   -on home O2 (at night only apparently)  -cxr reviewed personally and without acute disease 10 Abnormal LFTs: returning to normal.   11. Leukocytosis: 11.1 4/23  -afebrile. No active signs of infection 12.  Decreased pulmonary toilet add flutter valve       LOS: 8 days A FACE TO FACE EVALUATION WAS PERFORMED  Charlett Blake 04/19/2019, 9:31 AM

## 2019-04-19 NOTE — Progress Notes (Addendum)
Physical Therapy Session Note  Patient Details  Name: Joshua Whitaker MRN: [id number] Date of Birth: 12-06-1941  Today's Date: 04/19/2019 PT Individual Time: 1035-1101 and 1351-1404 PT Individual Time Calculation (min): 26 min and 13 min  Short Term Goals: Week 2:  PT Short Term Goal 1 (Week 2): Pt will negotiate 4 steps with B rails & mod assist for BLE strengthening.  PT Short Term Goal 2 (Week 2): Pt willl ambulate 50 ft with LRAD & min assist.  PT Short Term Goal 3 (Week 2): Pt will complete bed mobility with min assist.   Skilled Therapeutic Interventions/Progress Updates:  Treatment 1: Pt received asleep in bed but awakened & increased alertness with encouragement from therapist. Pt requires MAX cuing to initiate transferring to EOB but does so with supervision and hospital bed features. Provided pt with scrub pants & max assist for threading pants on BLE with pt able to pull them over hips in standing with supervision. Pt with thigh high ted hose already donned. BP assessed, see below:  Sitting EOB: BP = 113/49 mmHg (RUE), HR =98 bpm Standing EOB: BP = 100/44 mmHg (RUE), HR = 115 bpm Pt denies any adverse symptoms in standing.   Pt transfers sit<>stand with max cuing for hand placement with poor carryover within session & pt requiring supervision<>CGA for sit<>stand with pt demonstrating poor eccentric control with stand>sit. Pt ambulates 35 ft with RW & close supervision for endurance training. At end of session pt left sitting in recliner with BLE elevated, chair alarm donned & call bell in hand.   Pt on 2L/min supplemental oxygen throughout session & SpO2 remains >90% with spot checking; utilized ear to obtain reading 2/2 fingers with poor circulation/feeling cold. Pt does require cuing to close mouth & breathe through nose.   Pads of fingers on B hands (R worse than L) are blue during session & RN made aware.   Provided pt with thickened liquid upon his request & educated pt  on small sips.   Pain: pt denies c/o pain    Treatment 2: Pt received in bed requiring MAX encouragement to participate in session. Pt requires MAX tactile cuing to initiate supine>sitting EOB with use of hospital bed features but is able to complete movement with supervision. Pt sits EOB and is encouraged to stand but does not, instead pt lies back on bed with supervision & holds head with eyes closed.  Upon questioning pt reports "a nagging" pain & points to L side of chest, & states it started "awhile back". HR = 101 bpm, SpO2 = 100% with pt supine in bed - RN made aware of pt's c/o. Pt left in bed with alarm set & all needs at hand.   Pt on 2L/min supplemental oxygen via nasal cannula throughout session; therapist provides cuing to close mouth & breathe through nose.   Educated pt on need to participate & pt able to verbalize purpose of participation in therapy. Pt missed 17 minutes of skilled PT treatment 2/2 fatigue & pt unwilling to participate.     Therapy Documentation Precautions:  Precautions Precautions: Fall Restrictions Weight Bearing Restrictions: No      Therapy/Group: Individual Therapy  Waunita Schooner 04/19/2019, 2:11 PM

## 2019-04-20 ENCOUNTER — Inpatient Hospital Stay (HOSPITAL_COMMUNITY): Payer: Self-pay

## 2019-04-20 ENCOUNTER — Encounter (HOSPITAL_COMMUNITY): Payer: Medicare Other | Admitting: Speech Pathology

## 2019-04-20 ENCOUNTER — Inpatient Hospital Stay (HOSPITAL_COMMUNITY): Payer: Medicare Other

## 2019-04-20 ENCOUNTER — Inpatient Hospital Stay (HOSPITAL_COMMUNITY): Payer: Medicare Other | Admitting: Physical Therapy

## 2019-04-20 LAB — BASIC METABOLIC PANEL
Anion gap: 7 (ref 5–15)
BUN: 26 mg/dL — ABNORMAL HIGH (ref 8–23)
CO2: 25 mmol/L (ref 22–32)
Calcium: 8.2 mg/dL — ABNORMAL LOW (ref 8.9–10.3)
Chloride: 104 mmol/L (ref 98–111)
Creatinine, Ser: 0.64 mg/dL (ref 0.61–1.24)
GFR calc Af Amer: >60 mL/min (ref >60)
GFR calc non Af Amer: >60 mL/min (ref >60)
Glucose, Bld: 96 mg/dL (ref 70–99)
Potassium: 4.8 mmol/L (ref 3.5–5.1)
Sodium: 136 mmol/L (ref 135–145)

## 2019-04-20 LAB — MAGNESIUM: Magnesium: 1.8 mg/dL (ref 1.7–2.4)

## 2019-04-20 MED ORDER — CLONAZEPAM 0.5 MG PO TABS
0.5000 mg | ORAL_TABLET | Freq: Two times a day (BID) | ORAL | Status: DC | PRN
  Administered 2019-04-20: 0.5 mg via ORAL
  Filled 2019-04-20: qty 1

## 2019-04-20 NOTE — Progress Notes (Signed)
[place] patient has slept a total of 2 hours or less throughout this shift with numerous calls on call bell, insisting he is ok,Reassurance provided by staff, ADL and incontinent care, oral and skin care, States he is concern with ' car". Ptient informeed ,encourage wife will be contacted this morning to respond to his concerns.Staff has provided Incontinent care, oral care and skin care, provided. O2 nasally at 2 liters reapplied  After finding that patient has removed his oxygen therapyseveral times, sats remains 95 > provided

## 2019-04-20 NOTE — Progress Notes (Signed)
Nutrition Follow-up  DOCUMENTATION CODES:   Severe malnutrition in context of chronic illness, Underweight  INTERVENTION:   - Continue Vital Cuisine Shake TID, each supplement provides 520 kcal and 22 grams of protein  - Continue MVI with minerals daily  - Encourage adequate PO intake  NUTRITION DIAGNOSIS:   Severe Malnutrition related to chronic illness (COPD, EtOH abuse) as evidenced by severe muscle depletion, severe fat depletion, percent weight loss (18.9% weight loss in 10 months).  New diagnosis after completion of NFPE  GOAL:   Patient will meet greater than or equal to 90% of their needs  Progressing  MONITOR:   PO intake, Supplement acceptance, Diet advancement, Labs, Skin, Weight trends  REASON FOR ASSESSMENT:   Other (underweight BMI)    ASSESSMENT:   78 year old male with PMH of COPDand oxygen-dependentwith recent admission for exacerbation, chronic hyponatremia, EtOH abuse. Pt was admitted to [facility] on 4/6 with 2-day history of cough and SOB progressing to respiratory failure requiring intubation. Pt was transferred to [facility] on 4/7 and COVID-19 ruled out. Hypoxia felt to be due to COPD exacerbation and HCAP likely due toaspiration.Pt tolerated extubation on 4/11.GOC were discussed with family who as elected full scope of treatment. MBS on 4/15 revealed aspiration of thin liquids therefore pt placed on dysphagia 1 with nectar-thick liquids. Pt admitted to CIR on 4/18.  Discussed pt with RN who reports pt did not sleep well last night and therefore has not been participating much in therapies and also has not been eating as well as he normally does. RN reports that normally pt eats pretty well.  RD noted untouched lunch tray at bedside. RD spoke with pt and pt declined eating anything for lunch, stating "I'm just not hungry." Pt lethargic and did not answer anymore of RD's questions, stating "I'm tired."  Pt with overall weight loss of 2 lbs since admission to  CIR. Reviewed weight history in chart. Pt with 12.3 kg weight loss since 06/17/18. This is an 18.9% weight loss in 10 months which is significant for timeframe.  Meal Completion: 40-100% x last 8 meals (improved, averaging ~70%)  Medications reviewed and include: Pro-stat 30 ml BID, folic acid, magnesium gluconate, Megace 400 mg BID, MVI with minerals daily, thiamine, Florastor, Protonix  Labs reviewed.  NUTRITION - FOCUSED PHYSICAL EXAM:    Most Recent Value  Orbital Region  Severe depletion  Upper Arm Region  Severe depletion  Thoracic and Lumbar Region  Severe depletion  Buccal Region  Severe depletion  Temple Region  Severe depletion  Clavicle Bone Region  Severe depletion  Clavicle and Acromion Bone Region  Severe depletion  Scapular Bone Region  Severe depletion  Dorsal Hand  Severe depletion  Patellar Region  Severe depletion  Anterior Thigh Region  Severe depletion  Posterior Calf Region  Severe depletion  Edema (RD Assessment)  Mild [BLE]  Hair  Reviewed  Eyes  Reviewed  Mouth  Reviewed  Skin  Reviewed  Nails  Reviewed       Diet Order:   Diet Order            DIET - DYS 1 Room service appropriate? Yes with Assist; Fluid consistency: Nectar Thick  Diet effective now              EDUCATION NEEDS:   Not appropriate for education at this time  Skin:  Skin Assessment: Skin Integrity Issues: Stage I: sacrum, left elbow, right elbow, left heel, right heel  Last BM:  04/20/19  large type 4  Height:   Ht Readings from Last 1 Encounters:  04/11/19 5\' 10"  (1.778 m)    Weight:   Wt Readings from Last 1 Encounters:  04/20/19 53.1 kg    Ideal Body Weight:  75.5 kg  BMI:  Body mass index is 16.8 kg/m.  Estimated Nutritional Needs:   Kcal:  1850-2050  Protein:  90-105 grams  Fluid:  1.8-2.0 L    Gaynell Face, MS, RD, LDN Inpatient Clinical Dietitian Pager: [phone] Weekend/After Hours: [phone]

## 2019-04-20 NOTE — Progress Notes (Addendum)
Physical Therapy Session Note  Patient Details  Name: Joshua Whitaker MRN: [id number] Date of Birth: September 21, 1941  Today's Date: 04/20/2019 PT Individual Time: 8144-8185 PT Individual Time Calculation (min): 24 min   Short Term Goals: Week 2:  PT Short Term Goal 1 (Week 2): Pt will negotiate 4 steps with B rails & mod assist for BLE strengthening.  PT Short Term Goal 2 (Week 2): Pt willl ambulate 50 ft with LRAD & min assist.  PT Short Term Goal 3 (Week 2): Pt will complete bed mobility with min assist.   Skilled Therapeutic Interventions/Progress Updates:  Pt received in bed with nasal cannula laying in lap & therapist donned it total assist, providing education to pt on need to wear it. SpO2 checked = 99-100%, HR = 97 bpm. Pt already with thigh high teds donned & transfers supine>sitting EOB to don pants with max assist from therapist to thread on BLE and when given extra time, MAX encouragement, & max cuing to attempt to stand to pull pants over hips pt would not initiate or engage.  Pt returns to supine on his own volition and required total assist for rolling L<>R so therapist could pull pants over hips as pt would not participate and would not attempt bridging. Pt requires dependent assist to scoot to [facility]. Upon questioning pt reports he does not feel good. BP = 108/73 mmHg (LUE), HR = 100 bpm but denies pain. When in supine pt voiced agreement to perform bed level exercises and therapist provided max multimodal cuing & PROM for heel slides but pt with limited to no engagement in task. Continued to educate pt on benefits of participating in PT treatment. Pt declined eating lunch at this time. RN made aware of pt's current fatigue & complaints, LCSW made aware of pt's limited progress in CIR program. Pt left in bed with alarm set & call bell at hand.   Pt remained on 2L/min supplemental oxygen via nasal cannula throughout session.  Therapy Documentation Precautions:   Precautions Precautions: Fall Restrictions Weight Bearing Restrictions: No    General: PT Amount of Missed Time (min): 36 Minutes PT Missed Treatment Reason: Patient fatigue;Patient unwilling to participate     Therapy/Group: Individual Therapy  Waunita Schooner 04/20/2019, 2:39 PM

## 2019-04-20 NOTE — Progress Notes (Signed)
[place] PHYSICAL MEDICINE & REHABILITATION PROGRESS NOTE   Subjective/Complaints: Pt up in bed. No complaints. Seems to have been restless last night. Not so this morning  ROS: Patient denies fever, rash, sore throat, blurred vision, nausea, vomiting, diarrhea, cough, shortness of breath or chest pain, joint or back pain, headache, or mood change.    Objective:   No results found. No results for input(s): WBC, HGB, HCT, PLT in the last 72 hours. Recent Labs    04/17/19 1030 04/20/19 0602  NA 136 136  K 3.9 4.8  CL 103 104  CO2 21* 25  GLUCOSE 103* 96  BUN 22 26*  CREATININE 0.62 0.64  CALCIUM 8.5* 8.2*    Intake/Output Summary (Last 24 hours) at 04/20/2019 0938 Last data filed at 04/20/2019 6962 Gross per 24 hour  Intake 356 ml  Output -  Net 356 ml     Physical Exam: Vital Signs Blood pressure 122/65, pulse 94, temperature 98 F (36.7 C), temperature source Oral, resp. rate 15, height 5\' 10"  (1.778 m), weight 53.1 kg, SpO2 95 %. Constitutional: No distress . Vital signs reviewed. HEENT: EOMI, oral membranes moist Neck: supple Cardiovascular: RRR without murmur. No JVD    Respiratory: CTA Bilaterally without wheezes or rales. Normal effort    GI: BS +, non-tender, non-distended  Neurological: alert and initiating more Fair insight and awareness.  UE 4/5 prox to distal. LE: 3 to 3+/5 HF, KE and 4/5 distally. stable exam Skin: Skin iswarm. He isnot diaphoretic.  Psychiatric: more animated   Assessment/Plan: 1. Functional deficits secondary to debility/encephalopathy which require 3+ hours per day of interdisciplinary therapy in a comprehensive inpatient rehab setting.  Physiatrist is providing close team supervision and 24 hour management of active medical problems listed below.  Physiatrist and rehab team continue to assess barriers to discharge/monitor patient progress toward functional and medical goals  Care Tool:  Bathing    Body parts bathed by  patient: Chest, Abdomen, Right arm, Left arm, Face, Front perineal area, Buttocks, Right upper leg, Left upper leg   Body parts bathed by helper: Left lower leg, Right lower leg     Bathing assist Assist Level: Minimal Assistance - Patient > 75%     Upper Body Dressing/Undressing Upper body dressing   What is the patient wearing?: Pull over shirt    Upper body assist Assist Level: Minimal Assistance - Patient > 75%    Lower Body Dressing/Undressing Lower body dressing    Lower body dressing activity did not occur: N/A What is the patient wearing?: Incontinence brief     Lower body assist Assist for lower body dressing: Minimal Assistance - Patient > 75%     Toileting Toileting    Toileting assist Assist for toileting: Minimal Assistance - Patient > 75%     Transfers Chair/bed transfer  Transfers assist     Chair/bed transfer assist level: Contact Guard/Touching assist     Locomotion Ambulation   Ambulation assist      Assist level: Supervision/Verbal cueing Assistive device: Walker-rolling Max distance: 35 ft   Walk 10 feet activity   Assist  Walk 10 feet activity did not occur: Safety/medical concerns  Assist level: Supervision/Verbal cueing Assistive device: Walker-rolling   Walk 50 feet activity   Assist Walk 50 feet with 2 turns activity did not occur: Safety/medical concerns         Walk 150 feet activity   Assist Walk 150 feet activity did not occur: Safety/medical concerns  Assistive device: Walker-rolling    Walk 10 feet on uneven surface  activity   Assist Walk 10 feet on uneven surfaces activity did not occur: Safety/medical concerns         Wheelchair     Assist Will patient use wheelchair at discharge?: Yes Type of Wheelchair: Manual    Wheelchair assist level: Dependent - Patient 0%      Wheelchair 50 feet with 2 turns activity    Assist    Wheelchair 50 feet with 2 turns activity did not occur:  Safety/medical concerns(SOB)       Wheelchair 150 feet activity     Assist Wheelchair 150 feet activity did not occur: Safety/medical concerns(SOB)        Medical Problem List and Plan: 1.Functional and mobility deficitssecondary to debility and encephalopathy after respiratory failure --Continue CIR therapies including PT, OT, and SLP   -stamina still an issue. Hopeful that improving PO intake will help with exercise capacity 2. Antithrombotics: -DVT/anticoagulation:Pharmaceutical:Other (comment)--Eliquis -antiplatelet therapy: Plavix (no ASA) 3. Pain Management:tylenol prn as well as local measures prn. 4. Mood:LCSW to follow for evaluation and support. -antipsychotic agents: N/A 5. Neuropsych: This patientis notcapable of making decisions onhisown behalf. 6. Skin/Wound Care:routine pressure relief measures. 7. Fluids/Electrolytes/moderate protein deficient malnutrition:    -intake much improved  - BUN sl elevated.   -continue megace   -  K+ nl on 4/24, dc supplement  -repleting Mg++ , add on Mg++ to labs today    8. CAD/A Flutter: Continue metoprolol 12.5 mg bid with Apixaban and Plavix.  Vitals:   04/19/19 1921 04/20/19 0412  BP: (!) 118/50 122/65  Pulse: 100 94  Resp: 18 15  Temp: 97.7 F (36.5 C) 98 F (36.7 C)  SpO2: 94% 95%  rate controlled 4/27 -continue TEDS for orthostasis 9. COPD- oxygen dependent: Respiratory status improving. Continue Incruse and Ellipta daily with albuterol prn.   -on home O2 (at night only apparently)  -cxr reviewed  without acute disease 10 Abnormal LFTs: returning to normal.   11. Leukocytosis: 11.1 4/23  -afebrile. No active signs of infection 12.  Decreased pulmonary toilet added flutter valve       LOS: 9 days A FACE TO FACE EVALUATION WAS PERFORMED  Meredith Staggers 04/20/2019, 9:38 AM

## 2019-04-20 NOTE — Progress Notes (Signed)
Late entry: From night shift nurse it was reported that pt called out all night for one thing or another, didn't know why he called, complained that was seeing several animals in room and wanted them out.  Night shift is requesting medication to assist with behaviors. Today therapy has had difficulty with getting pt to work with them. Informed that pt didn't sleep at all.

## 2019-04-20 NOTE — Progress Notes (Signed)
Speech Language Pathology Daily Session Note  Patient Details  Name: Joshua Whitaker MRN: [id number] Date of Birth: 12-05-1941  Today's Date: 04/20/2019 SLP Individual Time: 1300-1330 SLP Individual Time Calculation (min): 30 min Short Term Goals: Week 2: SLP Short Term Goal 1 (Week 2): Patient will complete pharyngeal strengthening exercises with mod A. SLP Short Term Goal 2 (Week 2): Patient will demonstrate functional mastication and oral clearance of dysphagia 2 textures over 2 sessions prior to upgrade with supervision verbal cues. SLP Short Term Goal 3 (Week 2): Patient will consume trials of thin liquids with no overt s/sx of aspriation over 2 sessions with supervision verbal cues to assess readiness for repeat MBS. SLP Short Term Goal 4 (Week 2): Patient will consume current diet of Dys. 1 textures with nectar thick liquids without overt s/sx of aspiration with Mod I for use of swallowing compensatory strategies.   Skilled Therapeutic Interventions: Skilled ST services focused on swallow skills. Pt was not wearing nasal O2, SLP placed back on pt and pt was lethargic closing eyes intermittently and limited verbal expression, nurse (karen) stated pt had little sleep last night. SLP called and placed lunch order, since t never arrived, confirming with nurse, Santiago Glad. SLP provided education of today's MBS, sensed aspiration on thin, recommending continued diet with water protocol.  SLP facilitated oral care and provided thin via cup sips, pt required total A (removable of cup) to consume small sips and demonstrated x2 delayed cough, x1 explosive cough, requiring at least 1 minute to recover. Pt expelled thick white mucus following cough requring suction. SLP did not begin water protocol today due to lethargy and impaired cognition, will continue assessment for readiness in upcoming sessions. Pt was left in room with call bell within reach and bed alarm set. ST recommends to continue skilled ST  services.      Pain Pain Assessment Pain Score: 0-No pain  Therapy/Group: Individual Therapy  Blinda Turek  [facility] 04/20/2019, 2:49 PM

## 2019-04-20 NOTE — Progress Notes (Signed)
Occupational Therapy Session Note  Patient Details  Name: Joshua Whitaker MRN: [id number] Date of Birth: 07/22/1941  Today's Date: 04/20/2019 OT Individual Time: 1100-1155 OT Individual Time Calculation (min): 55 min    Short Term Goals: Week 2:  OT Short Term Goal 1 (Week 2): stg=ltg d/t ELOS  Skilled Therapeutic Interventions/Progress Updates:    Session focused on OOB tolerance, functional activity tolerance, and participation/independence in self care tasks. Supine pt's BP was 92/52 and pt asymptomatic. Pt transitioned to EOB and his BP rose to 98/70. Pt able to tolerate sitting for several minutes while OT encouraged pt to stand and change brief. Pt required MAX multimodal cueing to initiate stand, with increased time required for ALL initiation this session. Pt eventually stood with CGA and completed SPT to recliner. Pt changed brief with min A for clothing management and balance support as he completed peri hygiene. Pt was encouraged to remain seated in recliner, with several BP's taken over 20 minutes as follows: 109/53, 100/58, and eventually 94/47. At this level of hypotension, it was no longer safe for pt to remain OOB without (S). Pt completed SPT back to bed and was left supine. BP assessed before exiting room- 116/56. Bed alarm set and all needs met.   Therapy Documentation Precautions:  Precautions Precautions: Fall Restrictions Weight Bearing Restrictions: No   Therapy/Group: Individual Therapy  Curtis Sites 04/20/2019, 2:42 PM

## 2019-04-20 NOTE — Progress Notes (Signed)
Modified Barium Swallow Progress Note  Patient Details  Name: Joshua Whitaker MRN: [id number] Date of Birth: [birth year]  Today's Date: 04/20/2019  Modified Barium Swallow completed.  Full report located under Chart Review in the Imaging Section.  Brief recommendations include the following:  Clinical Impression  Pt with follow up MBS to assess for safe upgrade to thin liquids. Pt continues to exhibit cognitive impairments that prohibit use of compensatory swallow strategies and per chart review, pt with no desire to perform strengthening exercises. Pt continues with motor as well as sensory impairments. During this MBS, pt continues to present premature spillage, piecemeal swallowing,  swallow initiation at pyriform sinuses and decreased hyolaryngeal movement all resulting in sensed aspiration of thin liquids. Pt continues to have resuide on base of tongue and vallecula that poses aspiration risk as study continued pt's vallucular residue begins coating the underside of epiglottis and enters laryngeal vestibule. Multiple compensatory strategies attempted but pt unable to implement. Recommend continuing current diet with water protocol and possible upgrade to thin liquids at bedside when pt's mentation improves. Continue pharyngeal strengthening exercises.    Swallow Evaluation Recommendations                               Oral Care Recommendations: Oral care BID   Other Recommendations: Order thickener from pharmacy;Prohibited food (jello, ice cream, thin soups);Remove water pitcher;Have oral suction available    [name] 04/20/2019,12:34 PM

## 2019-04-20 NOTE — Progress Notes (Signed)
[place] patient has slept a total of 2 hours or less throughout this shift with numerous calls on call bell, instituted he is ok,Reassurance provided by staff, ADL and incontinent care, oral and skin care provided, States he is concern with ' car".and would like for his wife to be called l this mornings.O2 nasally at 2 liters reapplied several times O2 sats 96-98. Continue regime

## 2019-04-21 ENCOUNTER — Inpatient Hospital Stay (HOSPITAL_COMMUNITY): Payer: Medicare Other

## 2019-04-21 ENCOUNTER — Inpatient Hospital Stay (HOSPITAL_COMMUNITY): Payer: Medicare Other | Admitting: Speech Pathology

## 2019-04-21 ENCOUNTER — Inpatient Hospital Stay (HOSPITAL_COMMUNITY): Payer: Medicare Other | Admitting: Physical Therapy

## 2019-04-21 MED ORDER — CLONAZEPAM 0.5 MG PO TABS
0.2500 mg | ORAL_TABLET | Freq: Two times a day (BID) | ORAL | Status: DC | PRN
  Administered 2019-04-22: 0.25 mg via ORAL
  Filled 2019-04-21 (×2): qty 1

## 2019-04-21 NOTE — Progress Notes (Signed)
Joshua Whitaker PHYSICAL MEDICINE & REHABILITATION PROGRESS NOTE   Subjective/Complaints: Had a reasonable night rested after klonopin. Seems a little groggy this morning  ROS: Limited due to cognitive/behavioral .    Objective:   Dg Swallowing Func-speech Pathology  Result Date: 04/20/2019 Objective Swallowing Evaluation: Type of Study: MBS-Modified Barium Swallow Study  Patient Details Name: Joshua Whitaker MRN: [id number] Date of Birth: January 12, 1941 Today's Date: 04/20/2019 Time: SLP Start Time (ACUTE ONLY): 1105 -SLP Stop Time (ACUTE ONLY): 1115 SLP Time Calculation (min) (ACUTE ONLY): 10 min Past Medical History: Past Medical History: Diagnosis Date . Alcohol abuse  . Allergy  . Anxiety  . Asthma  . COPD (chronic obstructive pulmonary disease) ([place])  . Coronary artery calcification seen on CT scan  . Depression  . Hyponatremia  . Prostate cancer [facility])  Past Surgical History: Past Surgical History: Procedure Laterality Date . EYE SURGERY   . PROSTATE SURGERY   . SPINE SURGERY   HPI: 78 y.o. male with prior history of COPD, CAD, ETOH/ tobacco abuse, HTN, HLD, chronic hyponatremia presented to [facility] on 4/6 with 2 day history of shortness of breath and cough. Recent hospitalization for acute exacerbation of COPD and severe hyponatremia (3/20-3/23/20). Desaturation requiring BiPAP and was ultimately intubated 4/8-4/11/20. CXR showed L greater than R patchy densities. COVID-19 pending.  Subjective: pt alert, quiet, but appropriate Assessment / Plan / Recommendation CHL IP CLINICAL IMPRESSIONS 04/20/2019 Clinical Impression Pt with follow up MBS to assess for safe upgrade to thin liquids. Pt continues to exhibit cognitive impairments that prohibit use of compensatory swallow strategies and per chart review, pt with no desire to perform strengthening exercises. Pt continues with motor as well as sensory impairments. During this MBS, pt continues to present premature spillage, piecemeal swallowing,  swallow  initiation at pyriform sinuses and decreased hyolaryngeal movement all resulting in sensed aspiration of thin liquids. Pt continues to have resuide on base of tongue and vallecula that poses aspiration risk as study continued pt's vallucular residue begins coating the underside of epiglottis and enters laryngeal vestibule. Multiple compensatory strategies attempted but pt unable to implement. Recommend continuing current diet with water protocol and possible upgrade to thin liquids at bedside when pt's mentation improves. Continue pharyngeal strengthening exercises.  SLP Visit Diagnosis Dysphagia, oropharyngeal phase (R13.12) Attention and concentration deficit following -- Frontal lobe and executive function deficit following -- Impact on safety and function Mild aspiration risk   CHL IP TREATMENT RECOMMENDATION 04/07/2019 Treatment Recommendations Therapy as outlined in treatment plan below;F/U MBS in --- days (Comment)   Prognosis 04/07/2019 Prognosis for Safe Diet Advancement Good Barriers to Reach Goals -- Barriers/Prognosis Comment -- [facility] IP DIET RECOMMENDATION 04/17/2019 SLP Diet Recommendations -- Liquid Administration via -- Medication Administration -- Compensations Small sips/bites;Slow rate;Minimize environmental distractions Postural Changes --   CHL IP OTHER RECOMMENDATIONS 04/20/2019 Recommended Consults -- Oral Care Recommendations Oral care BID Other Recommendations Order thickener from pharmacy;Prohibited food (jello, ice cream, thin soups);Remove water pitcher;Have oral suction available   CH[facility] IP FOLLOW UP RECOMMENDATIONS 04/09/2019 Follow up Recommendations Inpatient Rehab   CHL IP FREQUENCY AND DURATION 04/09/2019 Speech Therapy Frequency (ACUTE ONLY) min 2x/week Treatment Duration --      CH[facility] IP ORAL PHASE 04/20/2019 Oral Phase Impaired Oral - Pudding Teaspoon -- Oral - Pudding Cup -- Oral - Honey Teaspoon -- Oral - Honey Cup -- Oral - Nectar Teaspoon -- Oral - Nectar Cup -- Oral - Nectar Straw --  Oral - Thin Teaspoon Weak lingual manipulation;Incomplete tongue  to palate contact;Piecemeal swallowing;Premature spillage Oral - Thin Cup Weak lingual manipulation;Incomplete tongue to palate contact;Piecemeal swallowing;Premature spillage Oral - Thin Straw -- Oral - Puree -- Oral - Mech Soft -- Oral - Regular -- Oral - Multi-Consistency -- Oral - Pill -- Oral Phase - Comment --  CHL IP PHARYNGEAL PHASE 04/20/2019 Pharyngeal Phase Impaired Pharyngeal- Pudding Teaspoon -- Pharyngeal -- Pharyngeal- Pudding Cup -- Pharyngeal -- Pharyngeal- Honey Teaspoon -- Pharyngeal -- Pharyngeal- Honey Cup -- Pharyngeal -- Pharyngeal- Nectar Teaspoon -- Pharyngeal -- Pharyngeal- Nectar Cup NT Pharyngeal -- Pharyngeal- Nectar Straw NT Pharyngeal -- Pharyngeal- Thin Teaspoon Delayed swallow initiation-pyriform sinuses;Reduced anterior laryngeal mobility;Reduced laryngeal elevation;Reduced tongue base retraction;Penetration/Aspiration during swallow;Pharyngeal residue - valleculae Pharyngeal Material enters airway, passes BELOW cords and not ejected out despite cough attempt by patient Pharyngeal- Thin Cup Delayed swallow initiation-pyriform sinuses;Reduced tongue base retraction;Reduced anterior laryngeal mobility;Reduced airway/laryngeal closure;Reduced laryngeal elevation;Penetration/Aspiration during swallow;Pharyngeal residue - valleculae;Pharyngeal residue - pyriform;Pharyngeal residue - posterior pharnyx Pharyngeal Material enters airway, passes BELOW cords and not ejected out despite cough attempt by patient Pharyngeal- Thin Straw NT Pharyngeal -- Pharyngeal- Puree NT Pharyngeal -- Pharyngeal- Mechanical Soft NT Pharyngeal -- Pharyngeal- Regular -- Pharyngeal -- Pharyngeal- Multi-consistency -- Pharyngeal -- Pharyngeal- Pill -- Pharyngeal -- Pharyngeal Comment --  CHL IP CERVICAL ESOPHAGEAL PHASE 04/20/2019 Cervical Esophageal Phase WFL Pudding Teaspoon -- Pudding Cup -- Honey Teaspoon -- Honey Cup -- Nectar Teaspoon -- Nectar  Cup -- Nectar Straw -- Thin Teaspoon -- Thin Cup -- Thin Straw -- Puree -- Mechanical Soft -- Regular -- Multi-consistency -- Pill -- Cervical Esophageal Comment -- Happi Overton 04/20/2019, 12:35 PM              No results for input(s): WBC, HGB, HCT, PLT in the last 72 hours. Recent Labs    04/20/19 0602  NA 136  K 4.8  CL 104  CO2 25  GLUCOSE 96  BUN 26*  CREATININE 0.64  CALCIUM 8.2*    Intake/Output Summary (Last 24 hours) at 04/21/2019 1010 Last data filed at 04/21/2019 0900 Gross per 24 hour  Intake 480 ml  Output -  Net 480 ml     Physical Exam: Vital Signs Blood pressure 127/65, pulse (!) 107, temperature 98 F (36.7 C), temperature source Oral, resp. rate 15, height 5\' 10"  (1.778 m), weight 53.2 kg, SpO2 99 %. Constitutional: No distress . Vital signs reviewed. HEENT: EOMI, oral membranes moist Neck: supple Cardiovascular: RRR without murmur. No JVD    Respiratory: CTA Bilaterally without wheezes or rales. Normal effort    GI: BS +, non-tender, non-distended  Neurological: alert and initiating more Fair insight and awareness.  UE 4/5 prox to distal. LE: 3 to 3+/5 HF, KE and 4/5 distally.  moves all 4's Skin: Skin iswarm. He isnot diaphoretic.  Psychiatric: more animated   Assessment/Plan: 1. Functional deficits secondary to debility/encephalopathy which require 3+ hours per day of interdisciplinary therapy in a comprehensive inpatient rehab setting.  Physiatrist is providing close team supervision and 24 hour management of active medical problems listed below.  Physiatrist and rehab team continue to assess barriers to discharge/monitor patient progress toward functional and medical goals  Care Tool:  Bathing    Body parts bathed by patient: Chest, Abdomen, Right arm, Left arm, Face, Front perineal area, Buttocks, Right upper leg, Left upper leg   Body parts bathed by helper: Left lower leg, Right lower leg     Bathing assist Assist Level: Minimal  Assistance - Patient > 75%  Upper Body Dressing/Undressing Upper body dressing   What is the patient wearing?: Pull over shirt    Upper body assist Assist Level: Minimal Assistance - Patient > 75%    Lower Body Dressing/Undressing Lower body dressing    Lower body dressing activity did not occur: N/A What is the patient wearing?: Incontinence brief     Lower body assist Assist for lower body dressing: Minimal Assistance - Patient > 75%     Toileting Toileting    Toileting assist Assist for toileting: Minimal Assistance - Patient > 75%     Transfers Chair/bed transfer  Transfers assist     Chair/bed transfer assist level: Contact Guard/Touching assist     Locomotion Ambulation   Ambulation assist      Assist level: Supervision/Verbal cueing Assistive device: Walker-rolling Max distance: 35 ft   Walk 10 feet activity   Assist  Walk 10 feet activity did not occur: Safety/medical concerns  Assist level: Supervision/Verbal cueing Assistive device: Walker-rolling   Walk 50 feet activity   Assist Walk 50 feet with 2 turns activity did not occur: Safety/medical concerns         Walk 150 feet activity   Assist Walk 150 feet activity did not occur: Safety/medical concerns    Assistive device: Walker-rolling    Walk 10 feet on uneven surface  activity   Assist Walk 10 feet on uneven surfaces activity did not occur: Safety/medical concerns         Wheelchair     Assist Will patient use wheelchair at discharge?: Yes Type of Wheelchair: Manual    Wheelchair assist level: Dependent - Patient 0%      Wheelchair 50 feet with 2 turns activity    Assist    Wheelchair 50 feet with 2 turns activity did not occur: Safety/medical concerns(SOB)       Wheelchair 150 feet activity     Assist Wheelchair 150 feet activity did not occur: Safety/medical concerns(SOB)        Medical Problem List and Plan: 1.Functional and  mobility deficitssecondary to debility and encephalopathy after respiratory failure --Continue CIR therapies including PT, OT, and SLP   -team conference today  -slow progress, poor stamina 2. Antithrombotics: -DVT/anticoagulation:Pharmaceutical:Other (comment)--Eliquis -antiplatelet therapy: Plavix (no ASA) 3. Pain Management:tylenol prn as well as local measures prn. 4. Mood:  -antipsychotic agents: N/A  ?depression  -anxiety? Reduce klonopin to .25mg  to limit neurosedation 5. Neuropsych: This patientis notcapable of making decisions onhisown behalf. 6. Skin/Wound Care:routine pressure relief measures. 7. Fluids/Electrolytes/moderate protein deficient malnutrition:    -intake better prior to yesterday  - BUN sl elevated.   -continue megace   - stopped k+ supp, 4.8  -repleting Mg++  1.8   8. CAD/A Flutter: Continue metoprolol 12.5 mg bid with Apixaban and Plavix.  Vitals:   04/21/19 0633 04/21/19 0814  BP: 127/65   Pulse: 97 (!) 107  Resp: 15 15  Temp: 98 F (36.7 C)   SpO2: 99%   rate controlled elevated today---observe for persistence. Cannot tolerate increase BB or CCB -continue TEDS for orthostasis 9. COPD- oxygen dependent: Respiratory status improving. Continue Incruse and Ellipta daily with albuterol prn.   -on home O2 (at night only apparently)  -cxr most recently without acute disease 10 Abnormal LFTs: returning to normal.   11. Leukocytosis: 11.1 4/23  -afebrile. No active signs of infection  -recheck bmet tomorrow 12.  Decreased pulmonary toilet added flutter valve       LOS: 10 days A FACE  TO FACE EVALUATION WAS PERFORMED  Meredith Staggers 04/21/2019, 10:10 AM

## 2019-04-21 NOTE — Progress Notes (Signed)
Palliative Medicine RN Note: Rec'd call from daughter [name] requesting admit. Consult fell off when pt transferred to CIR. Wadie Lessen, PMT NP, will continue to see Joshua Whitaker.  Marjie Skiff Jullie Arps, RN, BSN, [facility] Palliative Medicine Team 04/21/2019 2:30 PM Office [phone]

## 2019-04-21 NOTE — Progress Notes (Signed)
Occupational Therapy Session Note  Patient Details  Name: Joshua Whitaker MRN: [id number] Date of Birth: 07/12/1941  Today's Date: 04/21/2019 OT Individual Time: 6712-4580 OT Individual Time Calculation (min): 55 min    Short Term Goals: Week 2:  OT Short Term Goal 1 (Week 2): stg=ltg d/t ELOS  Skilled Therapeutic Interventions/Progress Updates:    Pt received supine, very lethargic and fatigued during session. No c/o pain or dizziness. While supine pt unable to initiate any participation in therapy. PA entered room and provided guidance re hypotension, with >99 systolic ok for movement. Pt was transferred to EOB with total A and pt improved alertness. Pt washed UB with max A to doff shirt, mod A to don, min A to wash. Pt required max multimodal cueing to don pants sitting EOB. Pt changed brief in standing with smear of BM, pt stating he did not need to use bathroom. Max A for peri hygiene d/t pt fatiguing quickly.  Pt returned to supine and was left with all needs met, bed alarm set.   BP during session: Supine: 94/48 Sitting EOB with teds donned: 111/46. RR: 36. HR: 111 bpm  Therapy Documentation Precautions:  Precautions Precautions: Fall Restrictions Weight Bearing Restrictions: No   Therapy/Group: Individual Therapy  Curtis Sites 04/21/2019, 7:14 AM

## 2019-04-21 NOTE — Progress Notes (Signed)
Physical Therapy Note  Patient Details  Name: Joshua Whitaker MRN: [id number] Date of Birth: [birth year] Today's Date: 04/21/2019    Attempted to see pt for scheduled PT session. Pt received in bed, lethargic, only opening eyes to verbal/tactile stimulation but pt not engaging with therapist and quickly closing eyes again. Pt missed 45 minutes of skilled PT treatment. RN made aware of pt's current status.   Waunita Schooner 04/21/2019, 11:15 AM

## 2019-04-21 NOTE — Progress Notes (Signed)
Physical Therapy Note  Patient Details  Name: Joshua Whitaker MRN: [id number] Date of Birth: 1941/06/09 Today's Date: 04/21/2019    Pt's plan of care adjusted to QD after speaking with care team as pt currently unable to tolerate current therapy schedule with OT, PT, and SLP.     Waunita Schooner 04/21/2019, 11:17 AM

## 2019-04-21 NOTE — Plan of Care (Signed)
  Problem: RH BOWEL ELIMINATION Goal: RH STG MANAGE BOWEL WITH ASSISTANCE Description STG Manage Bowel with  Mod Assistance.   Outcome: Progressing   Problem: RH SKIN INTEGRITY Goal: RH STG MAINTAIN SKIN INTEGRITY WITH ASSISTANCE Description STG Maintain Skin Integrity With Assistance. Outcome: Progressing   Problem: RH SAFETY Goal: RH STG ADHERE TO SAFETY PRECAUTIONS W/ASSISTANCE/DEVICE Description STG Adhere to Safety Precautions With Min  Assistance/Device.  Outcome: Progressing   Problem: RH KNOWLEDGE DEFICIT GENERAL Goal: RH STG INCREASE KNOWLEDGE OF SELF CARE AFTER HOSPITALIZATION Description Pt and family will be able to care for self upon discharge  Outcome: Progressing

## 2019-04-21 NOTE — Progress Notes (Signed)
SLP Cancellation Note  Patient Details Name: Joshua Whitaker MRN: [id number] DOB: October 19, 1941   Cancelled treatment:       Patient missed 30 minutes of skilled SLP intervention due to toileting. Upon arrival, patient was on the [facility] and remained on the commode throughout the entirety of the session. RN aware. Will re-attempt as schedule allows.                                                                                                 Joshua Whitaker 04/21/2019, 3:43 PM

## 2019-04-21 NOTE — Progress Notes (Signed)
Patient rested well after receiving Klonopin  po , closely monitor and assisted with ADL care Closely monitor, no acute respiratory distress,,

## 2019-04-22 ENCOUNTER — Inpatient Hospital Stay (HOSPITAL_COMMUNITY): Payer: Medicare Other | Admitting: Speech Pathology

## 2019-04-22 ENCOUNTER — Inpatient Hospital Stay (HOSPITAL_COMMUNITY): Payer: Medicare Other

## 2019-04-22 DIAGNOSIS — R7989 Other specified abnormal findings of blood chemistry: Secondary | ICD-10-CM

## 2019-04-22 DIAGNOSIS — I4892 Unspecified atrial flutter: Secondary | ICD-10-CM

## 2019-04-22 LAB — BASIC METABOLIC PANEL
Anion gap: 8 (ref 5–15)
BUN: 28 mg/dL — ABNORMAL HIGH (ref 8–23)
CO2: 26 mmol/L (ref 22–32)
Calcium: 8.2 mg/dL — ABNORMAL LOW (ref 8.9–10.3)
Chloride: 105 mmol/L (ref 98–111)
Creatinine, Ser: 0.67 mg/dL (ref 0.61–1.24)
GFR calc Af Amer: >60 mL/min (ref >60)
GFR calc non Af Amer: >60 mL/min (ref >60)
Glucose, Bld: 107 mg/dL — ABNORMAL HIGH (ref 70–99)
Potassium: 4.2 mmol/L (ref 3.5–5.1)
Sodium: 139 mmol/L (ref 135–145)

## 2019-04-22 LAB — CBC
HCT: 33.5 % — ABNORMAL LOW (ref 39.0–52.0)
Hemoglobin: 11.1 g/dL — ABNORMAL LOW (ref 13.0–17.0)
MCH: 29.8 pg (ref 26.0–34.0)
MCHC: 33.1 g/dL (ref 30.0–36.0)
MCV: 90.1 fL (ref 80.0–100.0)
Platelets: 242 10*3/uL (ref 150–400)
RBC: 3.72 MIL/uL — ABNORMAL LOW (ref 4.22–5.81)
RDW: 14.4 % (ref 11.5–15.5)
WBC: 11.9 10*3/uL — ABNORMAL HIGH (ref 4.0–10.5)
nRBC: 0 % (ref 0.0–0.2)

## 2019-04-22 MED ORDER — SODIUM CHLORIDE 0.45 % IV SOLN
INTRAVENOUS | Status: DC
  Administered 2019-04-22 – 2019-04-23 (×2): via INTRAVENOUS

## 2019-04-22 NOTE — Progress Notes (Signed)
Physical Therapy Session Note  Patient Details  Name: Joshua Whitaker MRN: [id number] Date of Birth: 1941/02/03  Today's Date: 04/22/2019 PT Individual Time: 1333-1405 PT Individual Time Calculation (min): 32 min   Short Term Goals: Week 2:  PT Short Term Goal 1 (Week 2): Pt will negotiate 4 steps with B rails & mod assist for BLE strengthening.  PT Short Term Goal 2 (Week 2): Pt willl ambulate 50 ft with LRAD & min assist.  PT Short Term Goal 3 (Week 2): Pt will complete bed mobility with min assist.   Skilled Therapeutic Interventions/Progress Updates:     Patient in bed upon PT arrival. Patient alert and agreeable to PT session. Patient on 2 L O2 at beginning of session.  Therapeutic Activity: Bed Mobility: Patient performed supine to/from sit x2 with min A for trunk support and LE management. Patient became SOB sitting EOB both times on 2 L O2, O2 sats dropped to 73% during both trials. Required 3L O2 and returned to supine to bring sats over 90% after 2 minutes during first trial and 4L O2, increasing 1 L every 30 seconds without increased saturation, and returning to supine for 2 minutes before saturation increased above 90% during second trial. Provided lying rest break in between trials with O2 sats remaining above 92%. RN made aware. Plan to d/c with palliative care tomorrow with family education. Patient educated on pursed lip breathing for improved oxygen support throughout session.  Patient in bed at end of session with breaks locked, bed alarm set, on 2L O2 with saturations at 97%, and all needs within reach.    Therapy Documentation Precautions:  Precautions Precautions: Fall Restrictions Weight Bearing Restrictions: No Pain: Patient denied pain throughout session.   Therapy/Group: Individual Therapy  [name] PT, DPT  04/22/2019, 3:59 PM

## 2019-04-22 NOTE — Patient Care Conference (Signed)
Inpatient RehabilitationTeam Conference and Plan of Care Update Date: 04/21/2019   Time: 2:35 PM    Patient Name: Joshua Whitaker      Medical Record Number: [id number]  Date of Birth: Apr 06, 1941 Sex: Male         Room/Bed: 4W26C/4W26C-01 Payor Info: Payor: Theme park [employer] MEDICARE / Plan: [facility] MEDICARE / Product Type: *No Product type* /    Admitting Diagnosis: debility  Admit Date/Time:  04/11/2019  5:17 AM Admission Comments: No comment available   Primary Diagnosis:  Debility Principal Problem: Debility  Patient Active Problem List   Diagnosis Date Noted  . Prerenal azotemia 04/22/2019  . Debility 04/11/2019  . Protein-calorie malnutrition, severe 04/10/2019  . Hypokalemia   . Weakness generalized   . Adult failure to thrive   . Pericardial effusion 04/02/2019  . Palliative care by specialist   . Lobar pneumonia ([place]) 04/01/2019  . Acute on chronic respiratory failure ([place]) 04/01/2019  . Atrial flutter ([place]) 03/30/2019  . Acute on chronic respiratory failure with hypoxia ([place]) 03/16/2019  . Alcohol abuse 03/16/2019  . Tobacco abuse 03/16/2019  . Medically noncompliant 03/16/2019  . Pressure injury of skin 03/14/2019  . Coronary artery calcification 03/14/2019  . Aortic atherosclerosis ([place]) 03/14/2019  . Chronic obstructive pulmonary disease ([place])   . Hyponatremia   . ST elevation   . Transaminitis   . Nodule of right lung 10/12/2015  . H/O prostate cancer 04/21/2014  . Essential hypertension 03/20/2011  . Hyperlipidemia 03/20/2011  . Panlobular emphysema ([place]) 03/20/2011    Expected Discharge Date: Expected Discharge Date: (?SNF)  Team Members Present: Physician leading conference: Dr. Alger Simons Social Worker Present: Lennart Pall, LCSW Nurse Present: Other (comment)(Crystal Richardson Landry, RN) PT Present: Lavone Nian, PT OT Present: Other (comment)(Sandra Rosana Hoes, OT) SLP Present: Weston Anna, SLP PPS Coordinator present : Gunnar Fusi     Current  Status/Progress Goal Weekly Team Focus  Medical   slow progress. ?depression. non-compliant at times. some progress with appetite. cognitive issues still  maximize activity tolerance  oxygen weaning. improve po intake.   Bowel/Bladder   Incontinent of bowel & bladder  To be continent with mod A  Assist with B/B PRN. Q4H scheduled toileting.   Swallow/Nutrition/ Hydration   Dysohagia 1 with nectar thick. Meds crushed applesauce, mod A  Supervised eating  Tolerance of current diet, use of swallowing compensatory mech.   ADL's   Hypotension and very limited activity tolerance.  Supervised ADL's  Tolerate OOB and remainn hemodynamically stable   Mobility   can be as little as supervision for bed mobility, CGA sit<>stand, gait with RW & CGA for short distances, 4 steps with B rails & min assist, very poor engagement & activity tolerance, impaired awareness  supervision overall with LRAD except min assist car transfer  endurance/activity tolerance, strengthening, sitting/standing balance, gait, bed mobility, transfers, pt education, d/c planning   Communication             Safety/Cognition/ Behavioral Observations            Pain   Pt has no compliants of pain  Pain goal<3  Assess pain Q shift and PRN   Skin   Stage 1 p.u. on sacrum  Improvement of sacral reddness  Q2H turns, barrier cream,    Rehab Goals Patient on target to meet rehab goals: No Rehab Goals Revised: participation has remained very poor and is not expected to reach any goals. *See Care Plan and progress notes for long and short-term  goals.     Barriers to Discharge  Current Status/Progress Possible Resolutions Date Resolved   Physician    Medical stability               Nursing                  PT  Decreased caregiver support;Home environment access/layout  stairs to enter home without rails, unsure if family can provide 24 hr assist              OT                  SLP                SW                 Discharge Planning/Teaching Needs:  Pt to return home with family/ wife providing 24/7 assistance vs SNF  Teaching to be planned closer to d/c.   Team Discussion:  Very poor participation continues overall - likely a depressive component at play.  Has been non-compliant at times with wearing O2.  Poor po intake.  At his best, he can be supervision/ CGA or max assist at worst.  ST recommends pt stay on D1, nectar diet.  Plan to QD schedule and team recommends SNF unless family ready to provide total care - SW to follow up.  Revisions to Treatment Plan:  NA    Continued Need for Acute Rehabilitation Level of Care: The patient requires daily medical management by a physician with specialized training in physical medicine and rehabilitation for the following conditions: Daily direction of a multidisciplinary physical rehabilitation program to ensure safe treatment while eliciting the highest outcome that is of practical value to the patient.: Yes Daily medical management of patient stability for increased activity during participation in an intensive rehabilitation regime.: Yes Daily analysis of laboratory values and/or radiology reports with any subsequent need for medication adjustment of medical intervention for : Post surgical problems;Pulmonary problems;Neurological problems   I attest that I was present, lead the team conference, and concur with the assessment and plan of the team.   Lennart Pall 04/22/2019, 4:50 PM     Team conference was held via web/ teleconference due to [place] - 19

## 2019-04-22 NOTE — Progress Notes (Signed)
Social Work Patient ID: Joshua Whitaker, male   DOB: January 16, 1941, 78 y.o.   MRN: [id number]   Have discussed team conference and d/c plans with pt's daughter, [name], today.  She states that family wishes are to take patient home tomorrow and they will resume providing 24/7 assistance for him.  Have attempted to explain the level of assistance he will require as well as the diet needs of Dys1, nectar thick liquids.  I have, also, explained that MD notes he required IVF last night and other medical concerns.  She still wants to plan for d/c tomorrow.  Have alerted team.  Have scheduled for family ed to begin at [facility] forward with plan for d/c tomorrow if medically cleared.  Brytni Dray, LCSW

## 2019-04-22 NOTE — Progress Notes (Signed)
Speech Language Pathology Daily Session Note  Patient Details  Name: Joshua Whitaker MRN: [id number] Date of Birth: 03/17/1941  Today's Date: 04/22/2019 SLP Individual Time: 0810-0830 SLP Individual Time Calculation (min): 20 min  Missed Time: 25 mins, declined   Short Term Goals: Week 2: SLP Short Term Goal 1 (Week 2): Patient will complete pharyngeal strengthening exercises with mod A. SLP Short Term Goal 2 (Week 2): Patient will demonstrate functional mastication and oral clearance of dysphagia 2 textures over 2 sessions prior to upgrade with supervision verbal cues. SLP Short Term Goal 3 (Week 2): Patient will consume trials of thin liquids with no overt s/sx of aspriation over 2 sessions with supervision verbal cues to assess readiness for repeat MBS. SLP Short Term Goal 4 (Week 2): Patient will consume current diet of Dys. 1 textures with nectar thick liquids without overt s/sx of aspiration with Mod I for use of swallowing compensatory strategies.   Skilled Therapeutic Interventions: Skilled treatment session focused on dysphagia goals. Upon arrival, patient was awake while upright in bed but his nasal cannula was around his neck without awareness. O2 saturations was 92% on room air. With encouragement, patient performed 2 repetitions of a pharyngeal strengthening exercise (chin tuck against resistance) with Mod verbal cues needed for accuracy. Patient declined to participate further in exercises due to fatigue and "laziness."  Therefore, patient missed remaninig 25 mins of session. Patient left upright in bed with alarm on and all needs within reach. Continue with current plan of care.      Pain No/Denies Pain   Therapy/Group: Individual Therapy  Shantina Chronister 04/22/2019, 8:33 AM

## 2019-04-22 NOTE — Progress Notes (Signed)
Patient awaken a intervals throughout shift, redirected and cooperative,denies pain, taken po liquids per order Dys 1,thicken, O2 at 2l nasally,Fowlers position, intermittent oral suction for thin white secretion prn, respiration unlabored. Monitor and assisted, Call bell and bed alarm in place,Continue regime

## 2019-04-22 NOTE — Progress Notes (Signed)
Occupational Therapy Session Note  Patient Details  Name: Joshua Whitaker MRN: [id number] Date of Birth: 12-03-1941  Today's Date: 04/22/2019 OT Individual Time: 0930-1015 OT Individual Time Calculation (min): 45 min    Short Term Goals: Week 2:  OT Short Term Goal 1 (Week 2): stg=ltg d/t ELOS  Skilled Therapeutic Interventions/Progress Updates:    Pt received supine with no c/o pain. Pt on bed pan, with discussion with NT re encouraging pt to get on BSC instead. Thigh high teds donned supine. Pt required max cueing to transition to EOB and mod A to initiate transfer. Pt able to complete SPT to [facility] with CGA. Pt's BP 114/55 once on BSC. Pt voided bowel/bladder and required mod A to complete peri hygiene d/t fatigue. Pt stating "I just can't catch my breath today", all VSS. Pt required mod A to don pants d/t being unwilling to reach distally. Pt returned to supine in bed and was left with all needs met.   Therapy Documentation Precautions:  Precautions Precautions: Fall Restrictions Weight Bearing Restrictions: No   Therapy/Group: Individual Therapy  Curtis Sites 04/22/2019, 7:13 AM

## 2019-04-22 NOTE — Patient Care Conference (Cosign Needed)
Inpatient RehabilitationTeam Conference and Plan of Care Update Date: 04/22/2019   Time: 4:26 PM    Patient Name: Joshua Whitaker      Medical Record Number: [id number]  Date of Birth: February 06, 1941 Sex: Male         Room/Bed: 4W26C/4W26C-01 Payor Info: Payor: [employer] park manager MEDICARE / Plan: [facility] MEDICARE / Product Type: *No Product type* /    Admitting Diagnosis: debility  Admit Date/Time:  04/11/2019  5:17 AM Admission Comments: No comment available   Primary Diagnosis:  Debility Principal Problem: Debility  Patient Active Problem List   Diagnosis Date Noted  . Prerenal azotemia 04/22/2019  . Debility 04/11/2019  . Protein-calorie malnutrition, severe 04/10/2019  . Hypokalemia   . Weakness generalized   . Adult failure to thrive   . Pericardial effusion 04/02/2019  . Palliative care by specialist   . Lobar pneumonia ([place]) 04/01/2019  . Acute on chronic respiratory failure ([place]) 04/01/2019  . Atrial flutter ([place]) 03/30/2019  . Acute on chronic respiratory failure with hypoxia ([place]) 03/16/2019  . Alcohol abuse 03/16/2019  . Tobacco abuse 03/16/2019  . Medically noncompliant 03/16/2019  . Pressure injury of skin 03/14/2019  . Coronary artery calcification 03/14/2019  . Aortic atherosclerosis ([place]) 03/14/2019  . Chronic obstructive pulmonary disease ([place])   . Hyponatremia   . ST elevation   . Transaminitis   . Nodule of right lung 10/12/2015  . H/O prostate cancer 04/21/2014  . Essential hypertension 03/20/2011  . Hyperlipidemia 03/20/2011  . Panlobular emphysema ([place]) 03/20/2011    Expected Discharge Date: Expected Discharge Date: (?SNF)  Team Members Present: Physician leading conference: Dr. Alger Simons Social Worker Present: Lennart Pall, LCSW Nurse Present: Other (comment)(Crystal Richardson Landry, RN) PT Present: Lavone Nian, PT OT Present: Other (comment)(Sandra Rosana Hoes, OT) SLP Present: Weston Anna, SLP PPS Coordinator present : Gunnar Fusi     Current  Status/Progress Goal Weekly Team Focus  Medical   slow progress. ?depression. non-compliant at times. some progress with appetite. cognitive issues still  maximize activity tolerance  oxygen weaning. improve po intake.   Bowel/Bladder   Incontinent of bowel & bladder  To be continent with mod A  Assist with B/B PRN. Q4H scheduled toileting.   Swallow/Nutrition/ Hydration   Dysohagia 1 with nectar thick. Meds crushed applesauce, mod A  Supervised eating  Tolerance of current diet, use of swallowing compensatory mech.   ADL's   Hypotension and very limited activity tolerance.  Supervised ADL's  Tolerate OOB and remainn hemodynamically stable   Mobility   can be as little as supervision for bed mobility, CGA sit<>stand, gait with RW & CGA for short distances, 4 steps with B rails & min assist, very poor engagement & activity tolerance, impaired awareness  supervision overall with LRAD except min assist car transfer  endurance/activity tolerance, strengthening, sitting/standing balance, gait, bed mobility, transfers, pt education, d/c planning   Communication             Safety/Cognition/ Behavioral Observations            Pain   Pt has no compliants of pain  Pain goal<3  Assess pain Q shift and PRN   Skin              Rehab Goals Patient on target to meet rehab goals: No Rehab Goals Revised: participation has remained very poor and is not expected to reach any goals. *See Care Plan and progress notes for long and short-term goals.     Barriers to  Discharge  Current Status/Progress Possible Resolutions Date Resolved   Physician    Medical stability               Nursing                  PT  Decreased caregiver support;Home environment access/layout  stairs to enter home without rails, unsure if family can provide 24 hr assist              OT                  SLP                SW                Discharge Planning/Teaching Needs:  Pt to return home with family/ wife  providing 24/7 assistance vs SNF  Teaching to be planned closer to d/c.   Team Discussion:  Poor participation. Poor PO intake. Hypotensive.  Revisions to Treatment Plan: NA    Continued Need for Acute Rehabilitation Level of Care: The patient requires daily medical management by a physician with specialized training in physical medicine and rehabilitation for the following conditions: Daily direction of a multidisciplinary physical rehabilitation program to ensure safe treatment while eliciting the highest outcome that is of practical value to the patient.: Yes Daily medical management of patient stability for increased activity during participation in an intensive rehabilitation regime.: Yes Daily analysis of laboratory values and/or radiology reports with any subsequent need for medication adjustment of medical intervention for : Post surgical problems;Pulmonary problems;Neurological problems   I attest that I was present, lead the team conference, and concur with the assessment and plan of the team.   Evalyn Casco 04/22/2019, 4:26 PM

## 2019-04-22 NOTE — Progress Notes (Signed)
Occupational Therapy Discharge Summary  Patient Details  Name: Joshua Whitaker MRN: [id number] Date of Birth: [birth year]   Patient has met 1 of 9 long term goals due to improved balance, postural control, ability to compensate for deficits and improved coordination.  Patient to discharge at [facility] Assist level. Pt family decided after palliative care consultation to take patient home prior to finishing rehab program impacting progress as well as CLOF.  Pt's daughter present for family training of BADLs and aware of recommendation to wait for HHOT to assist pt to shower. Daughter able to provide min physical A and max cognitive A required for pt.  Patient's care partner is independent to provide the necessary physical and cognitive assistance at discharge.    Reasons goals not met: Pt's family has decided to take pt home before he is able to finish rehab program following palliative care consultation.   Recommendation:  Patient will benefit from ongoing skilled OT services in home health setting to continue to advance functional skills in the area of BADL.  Equipment: BSC, shower chair  Reasons for discharge: lack of progress toward goals and discharge from hospital  Patient/family agrees with progress made and goals achieved: Yes  OT Discharge Precautions/Restrictions  Precautions Precautions: Fall Restrictions Weight Bearing Restrictions: No Vision Baseline Vision/History: Wears glasses Wears Glasses: Reading only Patient Visual Report: No change from baseline Vision Assessment?: No apparent visual deficits Perception  Perception: Within Functional Limits Praxis Praxis: Impaired Praxis Impairment Details: Initiation Cognition Overall Cognitive Status: History of cognitive impairments - at baseline Arousal/Alertness: Awake/alert Orientation Level: Oriented to person;Disoriented to time;Disoriented to situation;Oriented to place Attention: Sustained Sustained  Attention: Impaired Sustained Attention Impairment: Verbal basic Memory: Impaired Memory Impairment: Decreased recall of new information;Decreased short term memory Decreased Short Term Memory: Verbal basic Awareness: Impaired Awareness Impairment: Intellectual impairment Problem Solving: Impaired Problem Solving Impairment: Functional basic Executive Function: (All impaired) Behaviors: Impulsive Safety/Judgment: Impaired Sensation Sensation Light Touch: Appears Intact Hot/Cold: Appears Intact Coordination Gross Motor Movements are Fluid and Coordinated: No Fine Motor Movements are Fluid and Coordinated: Yes Coordination and Movement Description: Generalized deconditioning Finger Nose Finger Test: Slow but no over/undershooting Motor  Motor Motor: Within Functional Limits Motor - Discharge Observations: generalized weakness Mobility  Bed Mobility Bed Mobility: Rolling Right;Rolling Left;Supine to Sit Rolling Right: Supervision/verbal cueing Rolling Left: Supervision/Verbal cueing Supine to Sit: Minimal Assistance - Patient > 75% Sit to Supine: Supervision/Verbal cueing Transfers Sit to Stand: Contact Guard/Touching assist Stand to Sit: Contact Guard/Touching assist  Trunk/Postural Assessment  Cervical Assessment Cervical Assessment: Exceptions to WFL(forward head) Thoracic Assessment Thoracic Assessment: Exceptions to WFL(rounded shoulders) Lumbar Assessment Lumbar Assessment: Exceptions to WFL(posterior pelvic tilt) Postural Control Postural Control: Deficits on evaluation Righting Reactions: delayed  Balance Balance Balance Assessed: Yes Static Sitting Balance Static Sitting - Balance Support: Feet supported Static Sitting - Level of Assistance: 5: Stand by assistance Dynamic Sitting Balance Dynamic Sitting - Balance Support: Feet supported;Bilateral upper extremity supported Dynamic Sitting - Level of Assistance: 5: Stand by assistance Static Standing  Balance Static Standing - Balance Support: During functional activity;Bilateral upper extremity supported Static Standing - Level of Assistance: 5: Stand by assistance Dynamic Standing Balance Dynamic Standing - Balance Support: During functional activity;Bilateral upper extremity supported Dynamic Standing - Level of Assistance: 4: Min assist Dynamic Standing - Comments: during toileting tasks Extremity/Trunk Assessment RUE Assessment RUE Assessment: Exceptions to [facility] General Strength Comments: Overall WFL, generalized weakness LUE Assessment LUE Assessment: Exceptions to [facility] General Strength Comments: Overall  WFL, generalized weakness   Curtis Sites 04/22/2019, 5:30 PM

## 2019-04-22 NOTE — Progress Notes (Signed)
Joshua Whitaker PHYSICAL MEDICINE & REHABILITATION PROGRESS NOTE   Subjective/Complaints: Pt awake. Ate around 50% breakfast. No pain complaints. Denies any new breathing issues  ROS: Limited due to cognitive/behavioral   Objective:   No results found. Recent Labs    04/22/19 0515  WBC 11.9*  HGB 11.1*  HCT 33.5*  PLT 242   Recent Labs    04/20/19 0602 04/22/19 0515  NA 136 139  K 4.8 4.2  CL 104 105  CO2 25 26  GLUCOSE 96 107*  BUN 26* 28*  CREATININE 0.64 0.67  CALCIUM 8.2* 8.2*    Intake/Output Summary (Last 24 hours) at 04/22/2019 1151 Last data filed at 04/22/2019 0000 Gross per 24 hour  Intake 360 ml  Output -  Net 360 ml     Physical Exam: Vital Signs Blood pressure (!) 90/44, pulse (!) 116, temperature 97.9 F (36.6 C), temperature source Oral, resp. rate 15, height 5\' 10"  (1.778 m), weight 53.1 kg, SpO2 92 %. Constitutional: No distress . Vital signs reviewed. HEENT: EOMI, oral membranes moist Neck: supple Cardiovascular: RRR without murmur. No JVD    Respiratory: scattered wheezes, non-labored    GI: BS +, non-tender, non-distended  Neurological: alert and initiating more Fair but limited insight and awareness.  UE 4/5 prox to distal. LE: 3 to 3+/5 HF, KE and 4/5 distally.  moves all 4's Skin: Skin iswarm. He isnot diaphoretic.  Psychiatric: alert. cooperative   Assessment/Plan: 1. Functional deficits secondary to debility/encephalopathy which require 3+ hours per day of interdisciplinary therapy in a comprehensive inpatient rehab setting.  Physiatrist is providing close team supervision and 24 hour management of active medical problems listed below.  Physiatrist and rehab team continue to assess barriers to discharge/monitor patient progress toward functional and medical goals  Care Tool:  Bathing    Body parts bathed by patient: Right arm, Left arm, Chest, Abdomen   Body parts bathed by helper: Front perineal area, Buttocks      Bathing assist Assist Level: Moderate Assistance - Patient 50 - 74%     Upper Body Dressing/Undressing Upper body dressing   What is the patient wearing?: Pull over shirt    Upper body assist Assist Level: Minimal Assistance - Patient > 75%    Lower Body Dressing/Undressing Lower body dressing    Lower body dressing activity did not occur: N/A What is the patient wearing?: Incontinence brief     Lower body assist Assist for lower body dressing: Moderate Assistance - Patient 50 - 74%     Toileting Toileting    Toileting assist Assist for toileting: Moderate Assistance - Patient 50 - 74%     Transfers Chair/bed transfer  Transfers assist     Chair/bed transfer assist level: Contact Guard/Touching assist     Locomotion Ambulation   Ambulation assist      Assist level: Supervision/Verbal cueing Assistive device: Walker-rolling Max distance: 35 ft   Walk 10 feet activity   Assist  Walk 10 feet activity did not occur: Safety/medical concerns  Assist level: Supervision/Verbal cueing Assistive device: Walker-rolling   Walk 50 feet activity   Assist Walk 50 feet with 2 turns activity did not occur: Safety/medical concerns         Walk 150 feet activity   Assist Walk 150 feet activity did not occur: Safety/medical concerns    Assistive device: Walker-rolling    Walk 10 feet on uneven surface  activity   Assist Walk 10 feet on uneven surfaces activity did not  occur: Safety/medical concerns         Wheelchair     Assist Will patient use wheelchair at discharge?: Yes Type of Wheelchair: Manual    Wheelchair assist level: Dependent - Patient 0%      Wheelchair 50 feet with 2 turns activity    Assist    Wheelchair 50 feet with 2 turns activity did not occur: Safety/medical concerns(SOB)       Wheelchair 150 feet activity     Assist Wheelchair 150 feet activity did not occur: Safety/medical concerns(SOB)         Medical Problem List and Plan: 1.Functional and mobility deficitssecondary to debility and encephalopathy after respiratory failure --Continue CIR therapies including PT, OT, and SLP   -slow progress, poor stamina, palliative care follow up noted  -family wants to bring him home, not ready for hospice  -tried to review with/motivate pt today---really doesn't possess enough insight to act.  2. Antithrombotics: -DVT/anticoagulation:Pharmaceutical:Other (comment)--Eliquis -antiplatelet therapy: Plavix (no ASA) 3. Pain Management:tylenol prn as well as local measures prn. 4. Mood:  -antipsychotic agents: N/A  -pt denies being depressed  -anxiety? Reduced klonopin to .25mg  to limit neurosedation 5. Neuropsych: This patientis notcapable of making decisions onhisown behalf. 6. Skin/Wound Care:routine pressure relief measures. 7. Fluids/Electrolytes/moderate protein deficient malnutrition:    -intake better prior to yesterday  - BUN trending up---begin IVF.   -continue megace   -k+ 4.2  4/29  -repleting Mg++  1.8   8. CAD/A Flutter: Continue metoprolol 12.5 mg bid with Apixaban and Plavix.  Vitals:   04/22/19 0817 04/22/19 0910  BP:  (!) 90/44  Pulse:  (!) 116  Resp:    Temp:    SpO2: 92%   continued rate elevation. May be volume related -begin IVF -can't increase BB d/t low bp -continue TEDS for orthostasis 9. COPD- oxygen dependent: Respiratory status improving. Continue Incruse and Ellipta daily with albuterol prn.   -on home O2 (at night only apparently)  -cxr most recently without acute disease 10 Abnormal LFTs: returning to normal.   11. Leukocytosis: 11.9  -afebrile. Lung exam essentially unchanged. Recent cxr unchanged  -check UA/UCX    12.  Decreased pulmonary toilet added flutter valve       LOS: 11 days A FACE TO FACE EVALUATION WAS PERFORMED  Meredith Staggers 04/22/2019, 11:51 AM

## 2019-04-22 NOTE — Progress Notes (Signed)
Patient ID: Joshua Whitaker, male   DOB: 12/23/1941, 78 y.o.   MRN: [id number]  This NP visited patient at the bedside as a follow up for palliative medicine needs and emotional support.  Received call from daughter/Angela yesterday asking for ongoing PMT support.  I had worked with this patient and family when he was IP but lost contact when he was admitted to [facility].  Continued conversation regarding current medical situation.  Patient has made very little progress while he was in rehab and continues to be dependent for all ADLs.  [name] and I discussed the high level of nursing care needs he will require on discharge.  We discussed the difference between home health services versus home hospice services.  Daughter adamantly verbalizes the family's desire to take the patient home under home health services and continue to work with him in hopes of improvement.  Recommend outpatient palliative care services on discharge  Once again I discussed with family the importance of and consideration and  attention to [place].  Recommendation is for DNR/DNI knowing poor outcomes in similar patients.  We discussed high risk of decompensation and the importance of continued conversation with family and their  medical providers regarding overall plan of care and treatment options,  ensuring decisions are within the context of the patients values and GOCs.  Questions and concerns addressed   Discussed with Lennart Pall LCSW  Total time spent on the unit was 35 minutes  Greater than 50% of the time was spent in counseling and coordination of care  Wadie Lessen NP  Palliative Medicine Team Team Phone # [phone] Pager [phone]

## 2019-04-22 NOTE — Discharge Summary (Signed)
Physician Discharge Summary  Patient ID: Joshua Whitaker MRN: [id number] DOB/AGE: May 11, 1941 78 y.o.  Admit date: 04/11/2019 Discharge date: 04/23/2019  Discharge Diagnoses:  Principal Problem:   Debility Active Problems:   Panlobular emphysema ([place])   Alcohol abuse   Adult failure to thrive   Prerenal azotemia   Discharged Condition: stable   Significant Diagnostic Studies: Dg Chest 1 View  Result Date: 04/01/2019 CLINICAL DATA:  Endotracheal tube.  Gastric tube EXAM: CHEST  1 VIEW COMPARISON:  04/01/2019 FINDINGS: Endotracheal tube in good position.  NG tube in the stomach. COPD with apical emphysema bilaterally. Small bilateral effusions. Mild bibasilar airspace disease unchanged. IMPRESSION: Endotracheal tube in good position.  NG tube in the stomach Severe COPD with apical emphysema. No change in mild bibasilar airspace disease and small effusions. Electronically Signed   By: Franchot Gallo M.D.   On: 04/01/2019 18:28   Dg Chest 2 View  Result Date: 04/16/2019 CLINICAL DATA:  78 year old male with shortness of breath and cough. Negative for COVID-19 on 04/01/2019. EXAM: CHEST - 2 VIEW COMPARISON:  04/09/2019 and earlier. FINDINGS: Upright AP and lateral views of the chest. Chronic pulmonary hyperinflation. Calcified aortic atherosclerosis. Stable cardiac size and mediastinal contours. Chronic architectural distortion in the peripheral left upper lobe. No pneumothorax, pulmonary edema, pleural effusion or acute pulmonary opacity identified. Osteopenia. No acute osseous abnormality identified. IMPRESSION: Chronic lung disease with Emphysema (ICD10-J43.9). No acute cardiopulmonary abnormality identified. Aortic Atherosclerosis (ICD10-I70.0). Electronically Signed   By: Genevie Ann M.D.   On: 04/16/2019 09:12   Dg Chest 2 View  Result Date: 04/09/2019 CLINICAL DATA:  Respiratory failure.  Possible pneumonia. EXAM: CHEST - 2 VIEW COMPARISON:  04/02/2019 and 01/06/2019 FINDINGS: Patient  slightly rotated to the right. Lungs are adequately inflated and demonstrate evidence of emphysematous disease with left upper lobe scarring. No focal lobar consolidation. Small amount of bilateral pleural fluid seen posteriorly on the lateral film. No pneumothorax. Cardiomediastinal silhouette is within normal. Remainder of the exam is unchanged. IMPRESSION: Small bilateral pleural effusions. Emphysematous disease. Electronically Signed   By: Marin Olp M.D.   On: 04/09/2019 09:08   Dg Chest 2 View  Result Date: 03/30/2019 CLINICAL DATA:  Shortness of breath which is recurrent but intermittent. EXAM: CHEST - 2 VIEW COMPARISON:  03/13/2019.  01/06/2019.  Chest CT 04/30/2018. FINDINGS: Cardiomegaly and aortic atherosclerosis as seen previously. Emphysema, upper lobe predominant. Pulmonary scarring. Focal density in the left upper lobe as seen previously, felt to represent scarring by CT. Increased patchy density in the mid and lower lungs not seen previously that could represent superimposed pneumonia. IMPRESSION: Background severe COPD. Newly seen patchy density in the mid and lower lungs left more than right that could represent overlying acute pneumonia. Electronically Signed   By: Nelson Chimes M.D.   On: 03/30/2019 17:50   Dg Abd 1 View  Result Date: 04/06/2019 CLINICAL DATA:  Check gastric catheter placement EXAM: ABDOMEN - 1 VIEW COMPARISON:  04/05/2019 FINDINGS: Gastric catheter as been further advanced into the stomach. Tip lies in the stomach although the proximal side port remains in the distal esophagus. This could be advanced several cm. Degenerative changes of the lumbar spine are noted. Diffuse vascular calcifications are noted. IMPRESSION: Gastric catheter within the stomach as described. This could be advanced several cm. Electronically Signed   By: Inez Catalina M.D.   On: 04/06/2019 00:44   Dg Abd 1 View  Result Date: 04/05/2019 CLINICAL DATA:  78 y/o male NG tube  placement. EXAM:  ABDOMEN - 1 VIEW COMPARISON:  Plain film of the abdomen from earlier same day. FINDINGS: Enteric tube is now coiled within the lower esophagus. Visualized bowel gas pattern is nonobstructive. No evidence of free intraperitoneal air. Cholecystectomy clips in the RIGHT upper quadrant. IMPRESSION: Enteric tube now coiled within the lower esophagus. Recommend advancing at least 10 cm. These results will be called to the ordering clinician or representative by the Radiologist Assistant, and communication documented in the PACS or zVision Dashboard. Electronically Signed   By: Franki Cabot M.D.   On: 04/05/2019 17:57   Ct Head Wo Contrast  Result Date: 03/31/2019 CLINICAL DATA:  Respiratory distress, altered mental status EXAM: CT HEAD WITHOUT CONTRAST TECHNIQUE: Contiguous axial images were obtained from the base of the skull through the vertex without intravenous contrast. COMPARISON:  None. FINDINGS: Brain: Diffuse brain atrophy and chronic white matter microvascular ischemic changes throughout both cerebral hemispheres. No acute intracranial hemorrhage mass lesion, infarction, midline shift, herniation, hydrocephalus, or extra-axial fluid collection. No focal mass effect or edema. Cisterns are patent. Cerebellar atrophy as well. Vascular: Intracranial atherosclerosis.  No hyperdense vessel. Skull: Normal. Negative for fracture or focal lesion. Sinuses/Orbits: Minor mucosal thickening throughout the paranasal sinuses. No orbital abnormality or acute finding. Mastoids are clear. Other: None. IMPRESSION: Brain atrophy and chronic white matter microvascular ischemic changes. No acute intracranial abnormality by noncontrast CT. Electronically Signed   By: Jerilynn Mages.  Shick M.D.   On: 03/31/2019 14:57   Dg Chest Port 1 View  Result Date: 04/02/2019 CLINICAL DATA:  Acute respiratory failure EXAM: PORTABLE CHEST 1 VIEW COMPARISON:  Yesterday FINDINGS: Endotracheal tube tip between the clavicular heads and carina. The  orogastric tube reaches the stomach. COPD with emphysema and interstitial coarsening. There may be trace pleural fluid. Mild hazy density at the left apex. No pneumothorax. IMPRESSION: 1. Stable hardware positioning. 2. COPD with mild lung opacity and trace pleural fluid. Electronically Signed   By: Monte Fantasia M.D.   On: 04/02/2019 08:08   Dg Chest Port 1 View  Result Date: 04/01/2019 CLINICAL DATA:  Respiratory distress.  Status post intubation today. EXAM: PORTABLE CHEST 1 VIEW COMPARISON:  CT chest 04/30/2018. Single-view of the chest 03/31/2019. PA and lateral chest 03/30/2019. FINDINGS: Endotracheal tube is in place with the tip in good position well above the carina. The patient also has a new NG tube. The tube is visualized into the lower chest but obscured distally due to underpenetration. The lungs are severely emphysematous. Bibasilar airspace disease is again seen. Aeration appears mildly improved in the bases, particularly on the right. No pneumothorax or pleural effusion. Cardiomegaly. Atherosclerosis. No acute or focal bony abnormality. IMPRESSION: ETT in good position. NG tube is visualized into the lower chest. The remainder of the NG tube is not visible due to underpenetration. Bibasilar airspace disease demonstrates some improvement on the right. Severe emphysema. Electronically Signed   By: Inge Rise M.D.   On: 04/01/2019 13:55   Dg Chest Port 1 View  Result Date: 03/31/2019 CLINICAL DATA:  Dyspnea EXAM: PORTABLE CHEST 1 VIEW COMPARISON:  03/30/2019 FINDINGS: Cardiac enlargement.  COPD. Interval development of right lower lobe airspace disease, probable pneumonia. Small right effusion. Scarring in the left upper lobe unchanged. No definite heart failure IMPRESSION: Interval development of right lower lobe infiltrate, probable pneumonia. Small right effusion COPD Electronically Signed   By: Franchot Gallo M.D.   On: 03/31/2019 20:55   Dg Abd Portable 1v  Result Date:  04/05/2019  CLINICAL DATA:  Bedside OG tube placement. EXAM: PORTABLE ABDOMEN - 1 VIEW COMPARISON:  None. FINDINGS: The OG tube is looped and possibly kinked in the proximal stomach with its tip back up into the distal esophagus. The tube should be withdrawn and readvanced. Visualized bowel gas pattern unremarkable. Aortoiliac atherosclerosis without evidence of aneurysm. IMPRESSION: 1. OG tube is looped and possibly kinked in the proximal stomach with its tip back up into the distal esophagus. The tube should be withdrawn and readvanced. 2. No acute abdominal abnormality. Electronically Signed   By: Evangeline Dakin M.D.   On: 04/05/2019 16:35   Dg Swallowing Func-speech Pathology  Result Date: 04/20/2019 Objective Swallowing Evaluation: Type of Study: MBS-Modified Barium Swallow Study  Patient Details Name: Joshua Whitaker MRN: [id number] Date of Birth: [birth year] Today's Date: 04/20/2019 Time: SLP Start Time (ACUTE ONLY): 1105 -SLP Stop Time (ACUTE ONLY): 1115 SLP Time Calculation (min) (ACUTE ONLY): 10 min Past Medical History: Past Medical History: Diagnosis Date . Alcohol abuse  . Allergy  . Anxiety  . Asthma  . COPD (chronic obstructive pulmonary disease) ([place])  . Coronary artery calcification seen on CT scan  . Depression  . Hyponatremia  . Prostate cancer [facility])  Past Surgical History: Past Surgical History: Procedure Laterality Date . EYE SURGERY   . PROSTATE SURGERY   . SPINE SURGERY   HPI: 78 y.o. male with prior history of COPD, CAD, ETOH/ tobacco abuse, HTN, HLD, chronic hyponatremia presented to [facility] on 4/6 with 2 day history of shortness of breath and cough. Recent hospitalization for acute exacerbation of COPD and severe hyponatremia (3/20-3/23/20). Desaturation requiring BiPAP and was ultimately intubated 4/8-4/11/20. CXR showed L greater than R patchy densities. COVID-19 pending.  Subjective: pt alert, quiet, but appropriate Assessment / Plan / Recommendation CHL IP CLINICAL IMPRESSIONS 04/20/2019  Clinical Impression Pt with follow up MBS to assess for safe upgrade to thin liquids. Pt continues to exhibit cognitive impairments that prohibit use of compensatory swallow strategies and per chart review, pt with no desire to perform strengthening exercises. Pt continues with motor as well as sensory impairments. During this MBS, pt continues to present premature spillage, piecemeal swallowing,  swallow initiation at pyriform sinuses and decreased hyolaryngeal movement all resulting in sensed aspiration of thin liquids. Pt continues to have resuide on base of tongue and vallecula that poses aspiration risk as study continued pt's vallucular residue begins coating the underside of epiglottis and enters laryngeal vestibule. Multiple compensatory strategies attempted but pt unable to implement. Recommend continuing current diet with water protocol and possible upgrade to thin liquids at bedside when pt's mentation improves. Continue pharyngeal strengthening exercises.  SLP Visit Diagnosis Dysphagia, oropharyngeal phase (R13.12) Attention and concentration deficit following -- Frontal lobe and executive function deficit following -- Impact on safety and function Mild aspiration risk   CHL IP TREATMENT RECOMMENDATION 04/07/2019 Treatment Recommendations Therapy as outlined in treatment plan below;F/U MBS in --- days (Comment)   Prognosis 04/07/2019 Prognosis for Safe Diet Advancement Good Barriers to Reach Goals -- Barriers/Prognosis Comment -- CHL IP DIET RECOMMENDATION 04/17/2019 SLP Diet Recommendations -- Liquid Administration via -- Medication Administration -- Compensations Small sips/bites;Slow rate;Minimize environmental distractions Postural Changes --   CHL IP OTHER RECOMMENDATIONS 04/20/2019 Recommended Consults -- Oral Care Recommendations Oral care BID Other Recommendations Order thickener from pharmacy;Prohibited food (jello, ice cream, thin soups);Remove water pitcher;Have oral suction available   CHL IP  FOLLOW UP RECOMMENDATIONS 04/09/2019 Follow up Recommendations Inpatient Rehab   CHL IP FREQUENCY  AND DURATION 04/09/2019 Speech Therapy Frequency (ACUTE ONLY) min 2x/week Treatment Duration --      CHL IP ORAL PHASE 04/20/2019 Oral Phase Impaired Oral - Pudding Teaspoon -- Oral - Pudding Cup -- Oral - Honey Teaspoon -- Oral - Honey Cup -- Oral - Nectar Teaspoon -- Oral - Nectar Cup -- Oral - Nectar Straw -- Oral - Thin Teaspoon Weak lingual manipulation;Incomplete tongue to palate contact;Piecemeal swallowing;Premature spillage Oral - Thin Cup Weak lingual manipulation;Incomplete tongue to palate contact;Piecemeal swallowing;Premature spillage Oral - Thin Straw -- Oral - Puree -- Oral - Mech Soft -- Oral - Regular -- Oral - Multi-Consistency -- Oral - Pill -- Oral Phase - Comment --  CHL IP PHARYNGEAL PHASE 04/20/2019 Pharyngeal Phase Impaired Pharyngeal- Pudding Teaspoon -- Pharyngeal -- Pharyngeal- Pudding Cup -- Pharyngeal -- Pharyngeal- Honey Teaspoon -- Pharyngeal -- Pharyngeal- Honey Cup -- Pharyngeal -- Pharyngeal- Nectar Teaspoon -- Pharyngeal -- Pharyngeal- Nectar Cup NT Pharyngeal -- Pharyngeal- Nectar Straw NT Pharyngeal -- Pharyngeal- Thin Teaspoon Delayed swallow initiation-pyriform sinuses;Reduced anterior laryngeal mobility;Reduced laryngeal elevation;Reduced tongue base retraction;Penetration/Aspiration during swallow;Pharyngeal residue - valleculae Pharyngeal Material enters airway, passes BELOW cords and not ejected out despite cough attempt by patient Pharyngeal- Thin Cup Delayed swallow initiation-pyriform sinuses;Reduced tongue base retraction;Reduced anterior laryngeal mobility;Reduced airway/laryngeal closure;Reduced laryngeal elevation;Penetration/Aspiration during swallow;Pharyngeal residue - valleculae;Pharyngeal residue - pyriform;Pharyngeal residue - posterior pharnyx Pharyngeal Material enters airway, passes BELOW cords and not ejected out despite cough attempt by patient Pharyngeal- Thin  Straw NT Pharyngeal -- Pharyngeal- Puree NT Pharyngeal -- Pharyngeal- Mechanical Soft NT Pharyngeal -- Pharyngeal- Regular -- Pharyngeal -- Pharyngeal- Multi-consistency -- Pharyngeal -- Pharyngeal- Pill -- Pharyngeal -- Pharyngeal Comment --  CHL IP CERVICAL ESOPHAGEAL PHASE 04/20/2019 Cervical Esophageal Phase WFL Pudding Teaspoon -- Pudding Cup -- Honey Teaspoon -- Honey Cup -- Nectar Teaspoon -- Nectar Cup -- Nectar Straw -- Thin Teaspoon -- Thin Cup -- Thin Straw -- Puree -- Mechanical Soft -- Regular -- Multi-consistency -- Pill -- Cervical Esophageal Comment -- Happi Overton 04/20/2019, 12:35 PM              Dg Swallowing Func-speech Pathology  Result Date: 04/07/2019 Objective Swallowing Evaluation: Type of Study: MBS-Modified Barium Swallow Study  Patient Details Name: Joshua Whitaker MRN: [id number] Date of Birth: 07-Mar-1941 Today's Date: 04/07/2019 Time: SLP Start Time (ACUTE ONLY): 1200 -SLP Stop Time (ACUTE ONLY): 1217 SLP Time Calculation (min) (ACUTE ONLY): 17 min Past Medical History: Past Medical History: Diagnosis Date . Alcohol abuse  . Allergy  . Anxiety  . Asthma  . COPD (chronic obstructive pulmonary disease) ([place])  . Coronary artery calcification seen on CT scan  . Depression  . Hyponatremia  . Prostate cancer [facility])  Past Surgical History: Past Surgical History: Procedure Laterality Date . EYE SURGERY   . PROSTATE SURGERY   . SPINE SURGERY   HPI: 78 y.o. male with prior history of COPD, CAD, ETOH/ tobacco abuse, HTN, HLD, chronic hyponatremia presented to [facility] on 4/6 with 2 day history of shortness of breath and cough. Recent hospitalization for acute exacerbation of COPD and severe hyponatremia (3/20-3/23/20). Desaturation requiring BiPAP and was ultimately intubated 4/8-4/11/20. CXR showed L greater than R patchy densities. COVID-19 pending.  Subjective: pt alert, quiet, but appropraite Assessment / Plan / Recommendation CHL IP CLINICAL IMPRESSIONS 04/07/2019 Clinical Impression Pt has a  mild pharyngeal dysphagia with sensory and motor deficits that could be explained by recent intubation and generalized deconditioning. His oral phase is functional considering limited dentition, with mildly  prolonged, anterior mastication noted as this is where the majority of his teeth are. He has reduced hyolaryngeal movement, epiglottic deflection, and base of tongue retraction, with moderate vallecular residue post-swallow that is most prominent with more solid textures. This also results in penetration during the swallow with thin and nectar thick liquids, but with nectar thick liquid penetration transient in nature. Silent aspiration occurs on larger volumes of thin liquids. Coughing occurred frequently during testing but was not in relation to airway compromise on any barium, although he did seem to have thick secretions. Given the above as well as importance of conserving energy, recommend starting with Dys 1 diet and nectar thick liquids with good prognosis for advancement with additional time post-extubation and increase in strength of cough/overall cough.  SLP Visit Diagnosis Dysphagia, pharyngeal phase (R13.13) Attention and concentration deficit following -- Frontal lobe and executive function deficit following -- Impact on safety and function Moderate aspiration risk;Mild aspiration risk   CHL IP TREATMENT RECOMMENDATION 04/07/2019 Treatment Recommendations Therapy as outlined in treatment plan below;F/U MBS in --- days (Comment)   Prognosis 04/07/2019 Prognosis for Safe Diet Advancement Good Barriers to Reach Goals -- Barriers/Prognosis Comment -- CHL IP DIET RECOMMENDATION 04/07/2019 SLP Diet Recommendations Dysphagia 1 (Puree) solids;Nectar thick liquid Liquid Administration via Cup;Straw Medication Administration Crushed with puree Compensations Slow rate;Small sips/bites Postural Changes Seated upright at 90 degrees;Remain semi-upright after after feeds/meals (Comment)   CHL IP OTHER RECOMMENDATIONS  04/07/2019 Recommended Consults -- Oral Care Recommendations Oral care BID Other Recommendations Order thickener from pharmacy;Prohibited food (jello, ice cream, thin soups);Remove water pitcher;Have oral suction available   CHL IP FOLLOW UP RECOMMENDATIONS 04/07/2019 Follow up Recommendations (No Data)   CHL IP FREQUENCY AND DURATION 04/07/2019 Speech Therapy Frequency (ACUTE ONLY) min 2x/week Treatment Duration 2 weeks      CHL IP ORAL PHASE 04/07/2019 Oral Phase WFL Oral - Pudding Teaspoon -- Oral - Pudding Cup -- Oral - Honey Teaspoon -- Oral - Honey Cup -- Oral - Nectar Teaspoon -- Oral - Nectar Cup -- Oral - Nectar Straw -- Oral - Thin Teaspoon -- Oral - Thin Cup -- Oral - Thin Straw -- Oral - Puree -- Oral - Mech Soft -- Oral - Regular -- Oral - Multi-Consistency -- Oral - Pill -- Oral Phase - Comment --  CHL IP PHARYNGEAL PHASE 04/07/2019 Pharyngeal Phase Impaired Pharyngeal- Pudding Teaspoon -- Pharyngeal -- Pharyngeal- Pudding Cup -- Pharyngeal -- Pharyngeal- Honey Teaspoon -- Pharyngeal -- Pharyngeal- Honey Cup -- Pharyngeal -- Pharyngeal- Nectar Teaspoon -- Pharyngeal -- Pharyngeal- Nectar Cup Reduced epiglottic inversion;Reduced anterior laryngeal mobility;Reduced laryngeal elevation;Reduced airway/laryngeal closure;Reduced tongue base retraction;Pharyngeal residue - valleculae;Penetration/Aspiration during swallow Pharyngeal Material enters airway, remains ABOVE vocal cords then ejected out Pharyngeal- Nectar Straw Reduced epiglottic inversion;Reduced anterior laryngeal mobility;Reduced laryngeal elevation;Reduced airway/laryngeal closure;Reduced tongue base retraction;Pharyngeal residue - valleculae;Penetration/Aspiration during swallow Pharyngeal Material enters airway, remains ABOVE vocal cords then ejected out Pharyngeal- Thin Teaspoon Reduced epiglottic inversion;Reduced anterior laryngeal mobility;Reduced laryngeal elevation;Reduced airway/laryngeal closure;Reduced tongue base retraction Pharyngeal --  Pharyngeal- Thin Cup Reduced epiglottic inversion;Reduced anterior laryngeal mobility;Reduced laryngeal elevation;Reduced airway/laryngeal closure;Reduced tongue base retraction;Penetration/Aspiration during swallow Pharyngeal Material enters airway, remains ABOVE vocal cords and not ejected out Pharyngeal- Thin Straw Reduced epiglottic inversion;Reduced anterior laryngeal mobility;Reduced laryngeal elevation;Reduced airway/laryngeal closure;Reduced tongue base retraction;Pharyngeal residue - valleculae;Penetration/Aspiration during swallow Pharyngeal Material enters airway, passes BELOW cords without attempt by patient to eject out (silent aspiration) Pharyngeal- Puree Reduced epiglottic inversion;Reduced anterior laryngeal mobility;Reduced laryngeal elevation;Reduced airway/laryngeal closure;Reduced tongue base retraction;Pharyngeal  residue - valleculae Pharyngeal -- Pharyngeal- Mechanical Soft Reduced epiglottic inversion;Reduced anterior laryngeal mobility;Reduced laryngeal elevation;Reduced airway/laryngeal closure;Reduced tongue base retraction;Pharyngeal residue - valleculae Pharyngeal -- Pharyngeal- Regular -- Pharyngeal -- Pharyngeal- Multi-consistency -- Pharyngeal -- Pharyngeal- Pill -- Pharyngeal -- Pharyngeal Comment --  CHL IP CERVICAL ESOPHAGEAL PHASE 04/07/2019 Cervical Esophageal Phase Impaired Pudding Teaspoon -- Pudding Cup -- Honey Teaspoon -- Honey Cup -- Nectar Teaspoon -- Nectar Cup -- Nectar Straw -- Thin Teaspoon Esophageal backflow into the pharynx Thin Cup Esophageal backflow into the pharynx Thin Straw Esophageal backflow into the pharynx Puree -- Mechanical Soft -- Regular -- Multi-consistency -- Pill -- Cervical Esophageal Comment -- Venita Sheffield Nix 04/07/2019, 1:40 PM  Pollyann Glen, M.A. CCC-SLP Acute Rehabilitation Services Pager [phone] Office [phone]             US Abdomen Limited Ruq  Result Date: 04/06/2019 CLINICAL DATA:  78 y/o  M; transaminitis. EXAM: ULTRASOUND ABDOMEN  LIMITED RIGHT UPPER QUADRANT COMPARISON:  04/05/2019 abdomen radiographs. 04/14/2015 CT abdomen and pelvis. FINDINGS: Gallbladder: Absent gallbladder, surgical clips in gallbladder fossa on prior radiographs and CT compatible with cholecystectomy. No fluid within the gallbladder fossa. Common bile duct: Diameter: 4.1 mm. Liver: No focal lesion identified. Within normal limits in parenchymal echogenicity. Portal vein is patent on color Doppler imaging with normal direction of blood flow towards the liver. Other: Incidental note of a linear 4 mm echogenic focus within the right interpolar kidney, probably related to vascular calcification within the bilateral renal hilum on prior CT. Right pleural effusion. IMPRESSION: 1. Right pleural effusion. 2. Cholecystectomy. Otherwise unremarkable right upper quadrant ultrasound. Electronically Signed   By: Kristine Garbe M.D.   On: 04/06/2019 21:04    Labs:  Basic Metabolic Panel: Recent Labs  Lab 04/17/19 1030 04/20/19 0602 04/20/19 1043 04/22/19 0515 04/23/19 0500  NA 136 136  --  139 137  K 3.9 4.8  --  4.2 4.2  CL 103 104  --  105 103  CO2 21* 25  --  26 26  GLUCOSE 103* 96  --  107* 92  BUN 22 26*  --  28* 27*  CREATININE 0.62 0.64  --  0.67 0.58*  CALCIUM 8.5* 8.2*  --  8.2* 8.1*  MG  --   --  1.8  --   --     CBC: Recent Labs  Lab 04/22/19 0515 04/23/19 0500  WBC 11.9* 9.5  HGB 11.1* 10.2*  HCT 33.5* 31.4*  MCV 90.1 91.3  PLT 242 250    CBG: No results for input(s): GLUCAP in the last 168 hours.  Brief HPI:   Joshua Whitaker is a 78 year old male with history of COPD- Oxygen dependent with recent admission for COPD exacerbation with chronic hyponatremia due to alcohol abuse.  He was readmitted to [facility] on 03/30/2019 with 2-day history of cough shortness of breath requiring intubation.  He was started on treatment for HCAP likely due to aspiration as well as COPD exacerbation.  Hospital course significant for a flutter with  elevated troponin.  He was started on Eliquis with recommendations to continue at least 6 weeks and outpatient Zio monitor to evaluate for arrhythmias.  He tolerated extubation 4/11 and completed course of IV Unasyn.  Diet modified to dysphagia 1 nectar liquids due to evidence of aspiration on MBS.  GOC discussed with family who elected full scope of treatment.  Respiratory status was improving but he continued to have issues with weakness as well as  anxiety affecting mobility and ADLs.  CIR recommended for follow-up therapy.   Hospital Course: Joshua Whitaker was admitted to rehab 04/11/2019 for inpatient therapies to consist of PT, ST and OT at least three hours five days a week. Past admission physiatrist, therapy team and rehab RN have worked together to provide customized collaborative inpatient rehab.  Heart rate has been monitored on twice daily basis and is currently controlled on low-dose beta-blocker.  He was maintained on modified diet but continued to have poor p.o. intake therefore Megace was added to help with appetite.  Pro-stat was added as patient noted to be emaciated and malnourished.  Follow up labs revealed that hypokalemia has resolved and magonate was added for hypomagnesemia..  Abnormal LFTs are resolving.  He has had issues with orthostatic hypotension due to dehydration and was started on IV fluids for hydration 4/29 .   He continued to have persistent leukocytosis without signs of infection and this has resolved resolved. MBS was repeated on 4/27 showing ongoing motor and sensory impairments sensed silent aspiration of thin liquids.  He is to continue nectar liquids and water protocol between meals. Attempts at weaning oxygen unsuccessful and he continues to require supplemental oxygen during the day.  His endurance levels have been poor and therapy was modified to help improve participation.  He has made limited progress during his stay and continues to require max encouragement for  participation in therapy.  Palliative care was reconsulted to help readdress current medical situation, high risk of decompensation with poor outcome as well as recommendations of palliative care. Family expressed desire to take patient home with home health services in hopes of improvement in familiar setting. Family education was completed regarding all aspects of care. He was discharged to home on 04/23/19. He will continue to receive follow up [place] [place] after discharge.    Rehab course: During patient's stay in rehab weekly team conferences were held to monitor patient's progress, set goals and discuss barriers to discharge. At admission, patient required mod assist with mobility and max assist with basic self-care tasks.  He exhibited baseline cognitive deficits affecting recall, attention and reasoning as well as mildly prolonged mastication of regular consistency with residue and fatigue. He requires min assist with max cognitive cues to complete ADL tasks.  He requires mod assist for transfers with max cues and is able to ambulate few steps with RW and CGA.  He requires max assist with basic cognitive tasks, for safe swallow strategies and pharyngeal exercises for strengthening.    Discharge disposition: 01-Home or Self Care  Diet: Dysphagia 1, nectar liquids  Special Instructions: 1.  Offer supplements during the day and continue to encourage p.o. intake. 2. HHRN to draw BMET on 5/04 with results to Dr. Prudy Feeler.    Allergies as of 04/23/2019      Reactions   Tiotropium Bromide Monohydrate Other (See Comments)   Severe urinary retention   Varenicline Other (See Comments)   Psychiatric and dreams   Iodinated Diagnostic Agents    Statins       Medication List    STOP taking these medications   escitalopram 5 MG tablet Commonly known as:  LEXAPRO   Ipratropium-Albuterol 20-100 MCG/ACT Aers respimat Commonly known as:  Combivent Respimat      TAKE these medications   acetaminophen 325 MG tablet Commonly known as:  TYLENOL Take 1-2 tablets (325-650 mg total) by mouth every 4 (four) hours as needed  for mild pain.   apixaban 5 MG Tabs tablet Commonly known as:  ELIQUIS Take 1 tablet (5 mg total) by mouth 2 (two) times daily.   chlorhexidine 0.12 % solution Commonly known as:  PERIDEX 15 mLs by Mouth Rinse route 4 (four) times daily.   clonazePAM 0.5 MG tablet--Rx # 30 pills/1 refill Commonly known as:  KLONOPIN Take 0.5 tablets (0.25 mg total) by mouth 2 (two) times daily as needed (anxiety).   clopidogrel 75 MG tablet Commonly known as:  PLAVIX Take 1 tablet (75 mg total) by mouth daily.   fluticasone 50 MCG/ACT nasal spray Commonly known as:  FLONASE Place 1 spray into both nostrils daily as needed for allergies or rhinitis. What changed:    when to take this  reasons to take this   Fluticasone-Umeclidin-Vilant 100-62.5-25 MCG/INH Aepb Commonly known as:  Trelegy Ellipta Inhale 1 puff into the lungs daily.   folic acid 1 MG tablet Commonly known as:  FOLVITE Take 1 tablet (1 mg total) by mouth daily.   magnesium gluconate 500 MG tablet Commonly known as:  MAGONATE Take 1 tablet (500 mg total) by mouth daily. Start taking on:  Apr 24, 2019   megestrol 400 MG/10ML suspension Commonly known as:  MEGACE Take 10 mLs (400 mg total) by mouth 2 (two) times daily. For appetite stimulation   metoprolol tartrate 25 MG tablet Commonly known as:  LOPRESSOR Take 0.5 tablets (12.5 mg total) by mouth 2 (two) times daily.   multivitamin with minerals Tabs tablet Take 1 tablet by mouth daily.   nystatin 100000 UNIT/ML suspension Commonly known as:  MYCOSTATIN Take 5 mLs (500,000 Units total) by mouth 4 (four) times daily. Swish and spit out What changed:  additional instructions Notes to patient:  For thrush--thicken to nectar consistency if he can't spit it out   pantoprazole 40 MG tablet Commonly known as:   PROTONIX Take 1 tablet (40 mg total) by mouth at bedtime.   ProAir HFA 108 (90 Base) MCG/ACT inhaler Generic drug:  albuterol Inhale 1-2 puffs into the lungs every 6 (six) hours as needed for wheezing or shortness of breath. What changed:  See the new instructions.   Resource ThickenUp Clear Powd For nectar thick liquid Notes to patient:  Thicken liquids to nectar thick consistency   saccharomyces boulardii 250 MG capsule Commonly known as:  FLORASTOR Take 1 capsule (250 mg total) by mouth 2 (two) times daily. Notes to patient:  Open capsule and administer in pudding or applesauce   thiamine 100 MG tablet Take 1 tablet (100 mg total) by mouth daily.      Follow-up Information    Meredith Staggers, MD Follow up.   Specialty:  Physical Medicine and Rehabilitation Contact information: [address] [place] [address] [phone]        Jerline Pain, MD .   Specialty:  Cardiology Contact information: [phone] [address] [place] [address] [phone]        Sharyne Richters, MD Follow up.   Specialty:  Internal Medicine Why:  Call for follow up appointment Contact information: [address] [place] [address] [phone]           Signed: Bary Leriche 04/23/2019, 5:25 PM

## 2019-04-23 ENCOUNTER — Encounter (HOSPITAL_COMMUNITY): Payer: Medicare Other

## 2019-04-23 ENCOUNTER — Encounter (HOSPITAL_COMMUNITY): Payer: Medicare Other | Admitting: Speech Pathology

## 2019-04-23 ENCOUNTER — Ambulatory Visit (HOSPITAL_COMMUNITY): Payer: Medicare Other

## 2019-04-23 DIAGNOSIS — J449 Chronic obstructive pulmonary disease, unspecified: Secondary | ICD-10-CM | POA: Diagnosis not present

## 2019-04-23 LAB — CBC
HCT: 31.4 % — ABNORMAL LOW (ref 39.0–52.0)
Hemoglobin: 10.2 g/dL — ABNORMAL LOW (ref 13.0–17.0)
MCH: 29.7 pg (ref 26.0–34.0)
MCHC: 32.5 g/dL (ref 30.0–36.0)
MCV: 91.3 fL (ref 80.0–100.0)
Platelets: 250 10*3/uL (ref 150–400)
RBC: 3.44 MIL/uL — ABNORMAL LOW (ref 4.22–5.81)
RDW: 14.5 % (ref 11.5–15.5)
WBC: 9.5 10*3/uL (ref 4.0–10.5)
nRBC: 0 % (ref 0.0–0.2)

## 2019-04-23 LAB — BASIC METABOLIC PANEL
Anion gap: 8 (ref 5–15)
BUN: 27 mg/dL — ABNORMAL HIGH (ref 8–23)
CO2: 26 mmol/L (ref 22–32)
Calcium: 8.1 mg/dL — ABNORMAL LOW (ref 8.9–10.3)
Chloride: 103 mmol/L (ref 98–111)
Creatinine, Ser: 0.58 mg/dL — ABNORMAL LOW (ref 0.61–1.24)
GFR calc Af Amer: >60 mL/min (ref >60)
GFR calc non Af Amer: >60 mL/min (ref >60)
Glucose, Bld: 92 mg/dL (ref 70–99)
Potassium: 4.2 mmol/L (ref 3.5–5.1)
Sodium: 137 mmol/L (ref 135–145)

## 2019-04-23 LAB — PREALBUMIN: Prealbumin: 9.5 mg/dL — ABNORMAL LOW (ref 18–38)

## 2019-04-23 MED ORDER — FOLIC ACID 1 MG PO TABS: 1.0000 mg | ORAL_TABLET | Freq: Every day | ORAL | 1 refills | Status: AC

## 2019-04-23 MED ORDER — SACCHAROMYCES BOULARDII 250 MG PO CAPS: 250.0000 mg | ORAL_CAPSULE | Freq: Two times a day (BID) | ORAL | 0 refills | Status: AC

## 2019-04-23 MED ORDER — APIXABAN 5 MG PO TABS: 5.0000 mg | ORAL_TABLET | Freq: Two times a day (BID) | ORAL | 1 refills | Status: AC

## 2019-04-23 MED ORDER — CHLORHEXIDINE GLUCONATE 0.12 % MT SOLN: 15.0000 mL | Freq: Four times a day (QID) | OROMUCOSAL | 0 refills | Status: AC

## 2019-04-23 MED ORDER — NYSTATIN 100000 UNIT/ML MT SUSP: 5.0000 mL | Freq: Four times a day (QID) | OROMUCOSAL | 0 refills | Status: AC

## 2019-04-23 MED ORDER — ACETAMINOPHEN 325 MG PO TABS: 325.0000 mg | ORAL_TABLET | ORAL | Status: AC | PRN

## 2019-04-23 MED ORDER — FLUTICASONE-UMECLIDIN-VILANT 100-62.5-25 MCG/INH IN AEPB: 1.0000 | INHALATION_SPRAY | Freq: Every day | RESPIRATORY_TRACT | 1 refills | Status: AC

## 2019-04-23 MED ORDER — METOPROLOL TARTRATE 25 MG PO TABS: 12.5000 mg | ORAL_TABLET | Freq: Two times a day (BID) | ORAL | 1 refills | Status: AC

## 2019-04-23 MED ORDER — FLUTICASONE PROPIONATE 50 MCG/ACT NA SUSP: 1.0000 | Freq: Every day | NASAL | 5 refills | Status: AC | PRN

## 2019-04-23 MED ORDER — RESOURCE THICKENUP CLEAR PO POWD: ORAL | 1 refills | Status: AC

## 2019-04-23 MED ORDER — MEGESTROL ACETATE 400 MG/10ML PO SUSP: 400.0000 mg | Freq: Two times a day (BID) | ORAL | 2 refills | Status: AC

## 2019-04-23 MED ORDER — RESOURCE THICKENUP CLEAR PO POWD: 1.0000 g | ORAL | 1 refills | Status: DC | PRN

## 2019-04-23 MED ORDER — MAGNESIUM GLUCONATE 500 MG PO TABS: 500.0000 mg | ORAL_TABLET | Freq: Every day | ORAL | 1 refills | Status: AC

## 2019-04-23 MED ORDER — THIAMINE HCL 100 MG PO TABS: 100.0000 mg | ORAL_TABLET | Freq: Every day | ORAL | 1 refills | Status: AC

## 2019-04-23 MED ORDER — CLOPIDOGREL BISULFATE 75 MG PO TABS: 75.0000 mg | ORAL_TABLET | Freq: Every day | ORAL | 1 refills | Status: AC

## 2019-04-23 MED ORDER — CLONAZEPAM 0.5 MG PO TABS: 0.2500 mg | ORAL_TABLET | Freq: Two times a day (BID) | ORAL | 1 refills | Status: AC | PRN

## 2019-04-23 MED ORDER — PANTOPRAZOLE SODIUM 40 MG PO TBEC: 40.0000 mg | DELAYED_RELEASE_TABLET | Freq: Every day | ORAL | 1 refills | Status: AC

## 2019-04-23 NOTE — Progress Notes (Signed)
Social Work  Discharge Note  The overall goal for the admission was met for:   Discharge location: Yes - home with wife and family providing   Length of Stay: yes* - per family request, pt to d/c today.  LOS = 12 days  Discharge activity level: No - did not meet targeted supervision goals due to very poor engagement and participation  Home/community participation: No  Services provided included: MD, RD, PT, OT, SLP, RN, TR, Pharmacy and [place]: Medicare and Medicaid  Follow-up services arranged: Home Health: RN, PT, OT, ST via [place], DME: 16x16 lightweight w/c, cushion, rolling walker, tub bench, suction via [place] and Patient/Family has no preference for HH/DME agencies  Comments (or additional information):       Contact info:  Daughter, Ivonne Andrew @ [phone]  Patient/Family verbalized understanding of follow-up arrangements: Yes  Individual responsible for coordination of the follow-up plan: daughter  Confirmed correct DME delivered: Lennart Pall 04/23/2019    Derron Pipkins, Lorre Nick

## 2019-04-23 NOTE — Progress Notes (Signed)
Physical Therapy Session Note  Patient Details  Name: Joshua Whitaker MRN: [id number] Date of Birth: [birth year]  Today's Date: 04/23/2019 PT Individual Time: 1130-1210 PT Individual Time Calculation (min): 40 min   Short Term Goals: Week 2:  PT Short Term Goal 1 (Week 2): Pt will negotiate 4 steps with B rails & mod assist for BLE strengthening.  PT Short Term Goal 2 (Week 2): Pt willl ambulate 50 ft with LRAD & min assist.  PT Short Term Goal 3 (Week 2): Pt will complete bed mobility with min assist.    Skilled Therapeutic Interventions/Progress Updates:    Seen for family education with patient and patient's daughter with focus on car transfer, w/c parts management, and basic safety with mobility recommendations. Handoff from OT family education session. Pt requires significant amount of time to participate in simulated car transfer to sedan height using RW for stand step technique. Max cues for hand placement and technique as well as not to pull on IV and O2 tubing for safety. Pt continually asking for "one more minute." daughter attempting to encourage patient, physically uncrossing his legs, and reminding him this is needed for discharge home today. Ultimately pt requires heavy mod assist to perform sit <> stand from w/c with RW and CGA during transition once up. Physical assist needed to bring BLE in and out of car due to patient non-participatory. Pt at one point talking about smoking his cigar and referred to other family members that will be able to help him once he is home. Mod assist also needed for transfer from car back to w/c with CGA for actual stand step piece of transfer. Recommended use of gait belt for transfer to allow more access to hands on assist due to space restrictions with car transfer. Daughter reports being familiar with w/c breakdown. Educated on leg rest don/doffing, positioning of w/c and set up for car transfer as well as options for body postioning with transfer  (from front or side). She denies further concerns in regards to d/c at this time. Pt declined returning back in bed at end of session to practice bed mobility. Set up in w/c with safety belt donned and daughter in room.    Therapy Documentation Precautions:  Precautions Precautions: Fall Restrictions Weight Bearing Restrictions: No   Pain:  No reports of pain.   Therapy/Group: Individual Therapy  Canary Brim Ivory Broad, PT, DPT, CBIS  04/23/2019, 12:27 PM

## 2019-04-23 NOTE — Progress Notes (Addendum)
Physical Therapy Discharge Summary  Patient Details  Name: Joshua Whitaker MRN: [id number] Date of Birth: Dec 22, 1941    Patient has met 0 of 12 long term goals. Due to early discharge, per family request, pt did not complete full inpatient rehabilitation program. Therefore, pt level of assist is fluctuating from CGA with RW for mobility to mod assist for basic transfers.  Patient's daughter came to complete hands on family education prior to discharge and is independent to provide the necessary physical and cognitive assistance at discharge from what pt was agreeable to participate in during training sessions.   Reasons goals not met: Goals not met due to pt not completing full inpatient rehab program due to early d/c per family request  Recommendation:  Patient will benefit from ongoing skilled PT services in home health setting to continue to advance safe functional mobility, address ongoing impairments in strength, balance, endurance, functional mobility, cognition, and minimize fall risk.  Equipment: 16x16 w/c with basic cushion and RW  Reasons for discharge: lack of progress toward goals and discharge from hospital  Patient/family agrees with progress made and goals achieved: Yes  PT Discharge Precautions/Restrictions Precautions Precautions: Fall Precaution Comments: monitor O2 & BP (pt with some orthostatic hypotension, has been using thigh high teds recently) Restrictions Weight Bearing Restrictions: No  Vision/Perception  Pt wears glasses for reading only. No change from baseline. No apparent visual deficits.   Cognition Overall Cognitive Status: History of cognitive impairments - at baseline Arousal/Alertness: Awake/alert Orientation Level: Oriented to person;Disoriented to time;Disoriented to situation;Oriented to place Sustained Attention: Impaired Memory Impairment: Decreased recall of new information;Decreased short term memory Awareness: Impaired Awareness  Impairment: Intellectual impairment Problem Solving: Impaired Problem Solving Impairment: Functional basic Safety/Judgment: Impaired  Sensation Sensation Light Touch: Not tested Proprioception: Not tested Coordination Gross Motor Movements are Fluid and Coordinated: No(BLE limited by weakness) Fine Motor Movements are Fluid and Coordinated: Yes  Motor  Motor Motor: Abnormal postural alignment and control Motor - Discharge Observations: generalized deconditioning   Mobility Pt can be as little as supervision for bed mobilty in hospital bed when motivated/willing to complete task.   Locomotion  Pt has been able to ambulate as much as 35 ft with RW & close supervision and negotiate 4 steps (6") with B rails and min assist but this is when pt is motivated/willing to participate. Pt with significantly decreased gait speed overall.  Trunk/Postural Assessment  Cervical Assessment Cervical Assessment: Exceptions to WFL(forward head) Thoracic Assessment Thoracic Assessment: Exceptions to WFL(rounded shoulders) Lumbar Assessment Lumbar Assessment: Exceptions to WFL(posterior pelvic tilt) Postural Control Postural Control: Deficits on evaluation Righting Reactions: delayed Postural Limitations: delayed   Extremity Assessment  All extremities WFL. BLE grossly 3+/5 as no buckling noted in weight bearing.     Lars Masson, PT, DPT, CBIS  Lavone Nian, PT, DPT  04/24/2019, 4:38 PM

## 2019-04-23 NOTE — Progress Notes (Signed)
[facility] PHYSICAL MEDICINE & REHABILITATION PROGRESS NOTE   Subjective/Complaints: No new issues. Had an uneventful night  ROS: Limited due to cognitive/behavioral   Objective:   No results found. Recent Labs    04/22/19 0515 04/23/19 0500  WBC 11.9* 9.5  HGB 11.1* 10.2*  HCT 33.5* 31.4*  PLT 242 250   Recent Labs    04/22/19 0515 04/23/19 0500  NA 139 137  K 4.2 4.2  CL 105 103  CO2 26 26  GLUCOSE 107* 92  BUN 28* 27*  CREATININE 0.67 0.58*  CALCIUM 8.2* 8.1*    Intake/Output Summary (Last 24 hours) at 04/23/2019 0957 Last data filed at 04/23/2019 0236 Gross per 24 hour  Intake 600 ml  Output 400 ml  Net 200 ml     Physical Exam: Vital Signs Blood pressure 127/67, pulse 81, temperature 98 F (36.7 C), temperature source Oral, resp. rate 16, height 5\' 10"  (1.778 m), weight 53.2 kg, SpO2 93 %. Constitutional: No distress . Vital signs reviewed. HEENT: EOMI, oral membranes moist Neck: supple Cardiovascular: RRR without murmur. No JVD    Respiratory: CTA Bilaterally without wheezes or rales. Normal effort    GI: BS +, non-tender, non-distended  Neurological: alert and initiating more Fair but limited insight and awareness.  UE 4/5 prox to distal. LE: 3 to 3+/5 HF, KE and 4/5 distally.  moves all 4's Skin: Skin iswarm. He isnot diaphoretic.  Psychiatric: alert. cooperative   Assessment/Plan: 1. Functional deficits secondary to debility/encephalopathy which require 3+ hours per day of interdisciplinary therapy in a comprehensive inpatient rehab setting.  Physiatrist is providing close team supervision and 24 hour management of active medical problems listed below.  Physiatrist and rehab team continue to assess barriers to discharge/monitor patient progress toward functional and medical goals  Care Tool:  Bathing    Body parts bathed by patient: Right arm, Left arm, Chest, Abdomen   Body parts bathed by helper: Front perineal area, Buttocks      Bathing assist Assist Level: Moderate Assistance - Patient 50 - 74%     Upper Body Dressing/Undressing Upper body dressing   What is the patient wearing?: Pull over shirt    Upper body assist Assist Level: Minimal Assistance - Patient > 75%    Lower Body Dressing/Undressing Lower body dressing    Lower body dressing activity did not occur: N/A What is the patient wearing?: Incontinence brief     Lower body assist Assist for lower body dressing: Moderate Assistance - Patient 50 - 74%     Toileting Toileting    Toileting assist Assist for toileting: Moderate Assistance - Patient 50 - 74%     Transfers Chair/bed transfer  Transfers assist     Chair/bed transfer assist level: Contact Guard/Touching assist     Locomotion Ambulation   Ambulation assist      Assist level: Supervision/Verbal cueing Assistive device: Walker-rolling Max distance: 35 ft   Walk 10 feet activity   Assist  Walk 10 feet activity did not occur: Safety/medical concerns  Assist level: Supervision/Verbal cueing Assistive device: Walker-rolling   Walk 50 feet activity   Assist Walk 50 feet with 2 turns activity did not occur: Safety/medical concerns         Walk 150 feet activity   Assist Walk 150 feet activity did not occur: Safety/medical concerns    Assistive device: Walker-rolling    Walk 10 feet on uneven surface  activity   Assist Walk 10 feet on uneven surfaces  activity did not occur: Safety/medical concerns         Wheelchair     Assist Will patient use wheelchair at discharge?: Yes Type of Wheelchair: Manual    Wheelchair assist level: Dependent - Patient 0%      Wheelchair 50 feet with 2 turns activity    Assist    Wheelchair 50 feet with 2 turns activity did not occur: Safety/medical concerns(SOB)       Wheelchair 150 feet activity     Assist Wheelchair 150 feet activity did not occur: Safety/medical concerns(SOB)         Medical Problem List and Plan: 1.Functional and mobility deficitssecondary to debility and encephalopathy after respiratory failure -family to take patient home today  -HHRN, PT, OT, SLP---check BMET Monday  -follow up with primary  -outpt palliative care follow up  -family to provide full supervision 2. Antithrombotics: -DVT/anticoagulation:Pharmaceutical:Other (comment)--Eliquis -antiplatelet therapy: Plavix (no ASA) 3. Pain Management:tylenol prn as well as local measures prn. 4. Mood:  -antipsychotic agents: N/A  -pt denies being depressed  -anxiety? Reduced klonopin to .25mg  to limit neurosedation 5. Neuropsych: This patientis notcapable of making decisions onhisown behalf. 6. Skin/Wound Care:routine pressure relief measures. 7. Fluids/Electrolytes/moderate protein deficient malnutrition:    -BUN stable. (but still elevated) Will need encouragement to maintain hydration  -I recommend continuing megace   -k+ 4.2  4/29  -repleting Mg++  1.8   8. CAD/A Flutter: Continue metoprolol 12.5 mg bid with Apixaban and Plavix.  Vitals:   04/23/19 0342 04/23/19 0740  BP: 127/67   Pulse: 81   Resp: 16   Temp: 98 F (36.7 C)   SpO2: 100% 93%  -improved with IVF -continue to push po -  TEDS for orthostasis 9. COPD- oxygen dependent: Respiratory status improving. Continue Incruse and Ellipta daily with albuterol prn.   -on home O2 (at night only apparently)  -cxr most recently without acute disease 10 Abnormal LFTs: returning to normal.   11. Leukocytosis: improved to 9.5 today  -afebrile. Lung exam essentially unchanged. Recent cxr unchanged       12.  Decreased pulmonary toilet added flutter valve       LOS: 12 days A FACE TO FACE EVALUATION WAS PERFORMED  Meredith Staggers 04/23/2019, 9:57 AM

## 2019-04-23 NOTE — Progress Notes (Signed)
Speech Language Pathology Discharge Summary  Patient Details  Name: Joshua Whitaker MRN: [id number] Date of Birth: 12/21/1941  Today's Date: 04/23/2019 SLP Individual Time: 1000-1035 SLP Individual Time Calculation (min): 35 min   Skilled Therapeutic Interventions:  Skilled treatment session focused on completion of family education with the patient's daughter. She was educated on patient's current swallowing function, diet recommendations, appropriate textures, compensatory strategies, pharyngeal strengthening exercises, oral care and how to thicken liquids to a nectar-thick consistency. She verbalized and demonstrated understanding and all questions answered. Handouts also given to reinforce information. Patient handed off to OT. Continue with current plan of care.   Patient has met 0 of 3 long term goals.  Patient to discharge at overall Max level.   Reasons goals not met: Goals are not met due to limited participation and shorter length of stay than expected    Clinical Impression/Discharge Summary: Patient has made limited progress and has not met any LTGs this reporting period. Patient's progress has been limited by minimal participation due to patient reports of constant fatigue and feeling "lazy." Patient did have a repeat MBS this week to assess for any changes in swallowing function, however, patient continues to silently aspirate thin liquids. Therefore, patient remains on his current diet of Dys. 1 textures with nectar-thick liquids. Patient and family education is complete and patient's family wishes to take patient home and provide 24 hour supervision. Patient would benefit from continued skilled SLP intervention to maximize his swallowing function and reduce caregiver burden.   Care Partner:  Caregiver Able to Provide Assistance: Yes  Type of Caregiver Assistance: Physical;Cognitive  Recommendation:  24 hour supervision/assistance;Home Health SLP  Rationale for SLP Follow Up:  Reduce caregiver burden;Maximize cognitive function and independence;Maximize swallowing safety   Equipment: Thickener, suction    Reasons for discharge: Discharged from hospital   Patient/Family Agrees with Progress Made and Goals Achieved: Yes    McCook, [place]a 04/23/2019, 6:40 AM

## 2019-04-23 NOTE — Progress Notes (Signed)
Occupational Therapy Session Note  Patient Details  Name: Joshua Whitaker MRN: [id number] Date of Birth: 1941/03/23  Today's Date: 04/23/2019 OT Individual Time: 4599-7741 OT Individual Time Calculation (min): 56 min    Short Term Goals: Week 2:  OT Short Term Goal 1 (Week 2): stg=ltg d/t ELOS  Skilled Therapeutic Interventions/Progress Updates:    1:1. Pt received in bed lethargic with daughter present for family training. Pt requires MAX VC and tactile cues for sitting EOB using hospital bed features. OT demo LB dressing with A to thread BLE, donning teds (daughter dons 1LE), sit to stand, and stand pivot transfer to w/c. Pt completes clothing management of pants over hips with MIN A for steadying. OT educates on body mechanics, positioning, safety awareness, locking brakes, multimodal simple cuing, decreased processing, pursed lip breathing and managing O2 tank. Pt and daughter completes stand pivot transfer with RW w/c<> BSC with daughter providing min A and OT cuing to allow pt to initiate after "1,2,3" cue for mobility. Daughter educated on TTB transfer however no time to complete with pt. Educated daughter on doing sponge bathes for the time being until [facility] can follow up for problem solving/practicing shower transfer. Exited session with direct handoff to PT.    Therapy Documentation Precautions:  Precautions Precautions: Fall Restrictions Weight Bearing Restrictions: No General:   Vital Signs:   Pain:   ADL: ADL Grooming: Moderate assistance Where Assessed-Grooming: Wheelchair Upper Body Bathing: Moderate assistance Where Assessed-Upper Body Bathing: Sitting at sink, Wheelchair Lower Body Bathing: Maximal assistance Where Assessed-Lower Body Bathing: Sitting at sink, Wheelchair, Standing at sink Upper Body Dressing: Moderate assistance Where Assessed-Upper Body Dressing: Wheelchair Lower Body Dressing: Maximal assistance Where Assessed-Lower Body Dressing:  Wheelchair Toileting: Maximal assistance Where Assessed-Toileting: Bedside Commode Toilet Transfer: Moderate assistance Toilet Transfer Method: Stand pivot Toilet Transfer Equipment: Art gallery manager    Praxis   Exercises:   Other Treatments:     Therapy/Group: Individual Therapy  Tonny Branch 04/23/2019, 11:54 AM

## 2019-04-23 NOTE — Plan of Care (Signed)
Pt did meet goals of overall supervision/min assist due to not completing inpatient rehab program with early d/c requested by family after palliative care consult. Pt requires CGA to mod/max assist due to fluctuating levels of willingness to participate in mobility activities. See d/c summary for further details.

## 2019-04-23 NOTE — Progress Notes (Signed)
  Patient ID: Joshua Whitaker, male   DOB: [birth year], 79 y.o.   MRN: [id number]      Diagnosis codes:  J96.21;  J44/1  Height:  5\' 10"           Weight:    115 lbs        Patient suffers from debility following acute respiratory failure and COPD which impairs their ability to perform daily activities like toileting and mobility   in the home.  A rolling walker will not resolve issue with performing activities of daily living.  A wheelchair will allow patient to safely perform daily activities.  Patient is not able to propel themselves in the home using a standard weight wheelchair due to severe weakness and debility.  Patient can self propel in the lightweight wheelchair.   Reesa Chew, PA-C

## 2019-04-24 ENCOUNTER — Inpatient Hospital Stay (HOSPITAL_COMMUNITY): Payer: Medicare Other | Admitting: Physical Therapy

## 2019-04-24 DIAGNOSIS — I4892 Unspecified atrial flutter: Secondary | ICD-10-CM | POA: Diagnosis not present

## 2019-04-24 DIAGNOSIS — Z5181 Encounter for therapeutic drug level monitoring: Secondary | ICD-10-CM | POA: Diagnosis not present

## 2019-04-24 DIAGNOSIS — Z7901 Long term (current) use of anticoagulants: Secondary | ICD-10-CM | POA: Diagnosis not present

## 2019-04-24 DIAGNOSIS — Z7902 Long term (current) use of antithrombotics/antiplatelets: Secondary | ICD-10-CM | POA: Diagnosis not present

## 2019-04-24 DIAGNOSIS — J181 Lobar pneumonia, unspecified organism: Secondary | ICD-10-CM | POA: Diagnosis not present

## 2019-04-24 DIAGNOSIS — J440 Chronic obstructive pulmonary disease with acute lower respiratory infection: Secondary | ICD-10-CM | POA: Diagnosis not present

## 2019-04-24 DIAGNOSIS — J690 Pneumonitis due to inhalation of food and vomit: Secondary | ICD-10-CM | POA: Diagnosis not present

## 2019-04-24 DIAGNOSIS — J441 Chronic obstructive pulmonary disease with (acute) exacerbation: Secondary | ICD-10-CM | POA: Diagnosis not present

## 2019-04-24 DIAGNOSIS — Z9981 Dependence on supplemental oxygen: Secondary | ICD-10-CM | POA: Diagnosis not present

## 2019-04-24 DIAGNOSIS — R1310 Dysphagia, unspecified: Secondary | ICD-10-CM | POA: Diagnosis not present

## 2019-04-24 DIAGNOSIS — J9621 Acute and chronic respiratory failure with hypoxia: Secondary | ICD-10-CM | POA: Diagnosis not present

## 2019-04-25 DIAGNOSIS — E43 Unspecified severe protein-calorie malnutrition: Secondary | ICD-10-CM | POA: Diagnosis not present

## 2019-04-25 DIAGNOSIS — J449 Chronic obstructive pulmonary disease, unspecified: Secondary | ICD-10-CM | POA: Diagnosis not present

## 2019-04-25 DIAGNOSIS — I4892 Unspecified atrial flutter: Secondary | ICD-10-CM | POA: Diagnosis not present

## 2019-04-25 DIAGNOSIS — E871 Hypo-osmolality and hyponatremia: Secondary | ICD-10-CM | POA: Diagnosis not present

## 2019-04-25 DIAGNOSIS — R0902 Hypoxemia: Secondary | ICD-10-CM | POA: Diagnosis not present

## 2019-04-25 DIAGNOSIS — R911 Solitary pulmonary nodule: Secondary | ICD-10-CM | POA: Diagnosis not present

## 2019-04-25 DIAGNOSIS — I6529 Occlusion and stenosis of unspecified carotid artery: Secondary | ICD-10-CM | POA: Diagnosis not present

## 2019-04-25 DIAGNOSIS — I10 Essential (primary) hypertension: Secondary | ICD-10-CM | POA: Diagnosis not present

## 2019-04-25 DIAGNOSIS — J431 Panlobular emphysema: Secondary | ICD-10-CM | POA: Diagnosis not present

## 2019-04-25 DIAGNOSIS — R011 Cardiac murmur, unspecified: Secondary | ICD-10-CM | POA: Diagnosis not present

## 2019-04-25 DIAGNOSIS — I2510 Atherosclerotic heart disease of native coronary artery without angina pectoris: Secondary | ICD-10-CM | POA: Diagnosis not present

## 2019-04-26 DIAGNOSIS — I469 Cardiac arrest, cause unspecified: Secondary | ICD-10-CM | POA: Diagnosis not present

## 2019-04-26 DIAGNOSIS — R0602 Shortness of breath: Secondary | ICD-10-CM | POA: Diagnosis not present

## 2019-04-27 DIAGNOSIS — J449 Chronic obstructive pulmonary disease, unspecified: Secondary | ICD-10-CM | POA: Diagnosis not present

## 2019-04-29 ENCOUNTER — Ambulatory Visit: Payer: Medicare Other | Admitting: Family Medicine

## 2019-04-30 ENCOUNTER — Telehealth: Payer: Self-pay | Admitting: *Deleted

## 2019-04-30 NOTE — Telephone Encounter (Signed)
Unable to reach patients home or daughter Ivonne Andrew regarding setting up a 14 day Zio patch monitor per Dr. Sallyanne Kuster.  Patient scheduled for follow up with Dr. Marlou Porch in June.  Contacted daughter, Jorge Ny, listed on [place].  Was informed by Adonis Brook her father passed away at home Sunday, 2019-05-09.  Condolences expressed.

## 2019-04-30 NOTE — Telephone Encounter (Signed)
Thank you Joshua Whitaker.  I sent a message to MR to mark pt's chart as deceased.  Sad.

## 2019-05-01 NOTE — Telephone Encounter (Signed)
Thank you. Condolences.

## 2019-05-23 DIAGNOSIS — J449 Chronic obstructive pulmonary disease, unspecified: Secondary | ICD-10-CM | POA: Diagnosis not present

## 2019-05-25 DEATH — deceased

## 2019-06-12 ENCOUNTER — Telehealth: Payer: Medicare Other | Admitting: Cardiology

## 2019-08-23 IMAGING — CT CT HEAD WITHOUT CONTRAST
3 series · 15 of 47 positions shown, 18 images · non-contrast
Comparison: None.

CLINICAL DATA: Respiratory distress, altered mental status

EXAM:
CT HEAD WITHOUT CONTRAST
TECHNIQUE: Contiguous axial images were obtained from the base of the skull
through the vertex without intravenous contrast.

[Series 2: head trauma wo · axial · 0.42mm/px · z∈[-28,+112]mm · 9 of 34 slices shown, 12 images]
[im 3/34  brain]
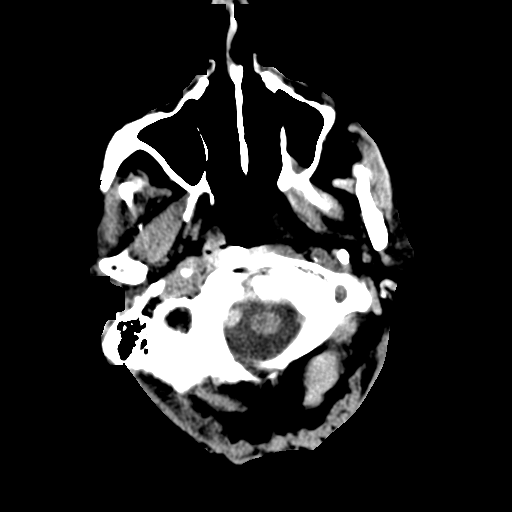
[im 3/34  bone]
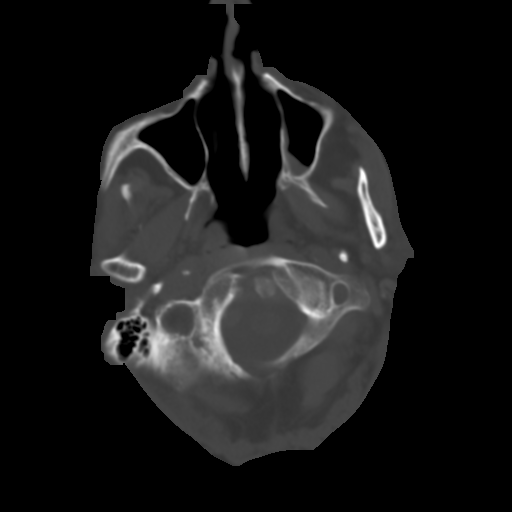
[im 6/34  brain]
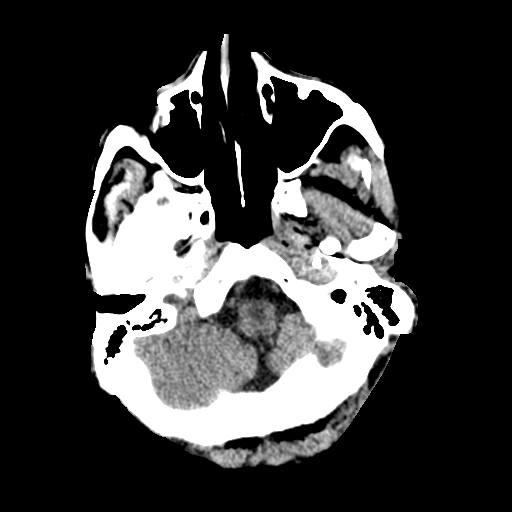
[im 10/34  brain]
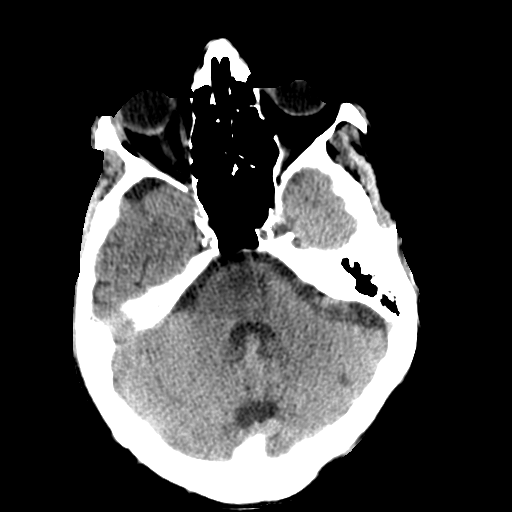
[im 13/34  brain]
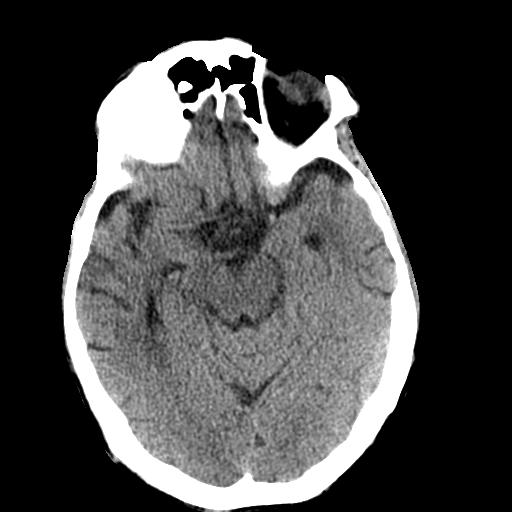
[im 18/34  brain]
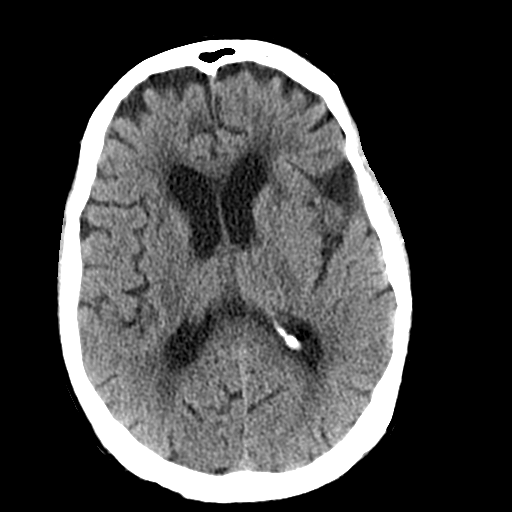
[im 18/34  bone]
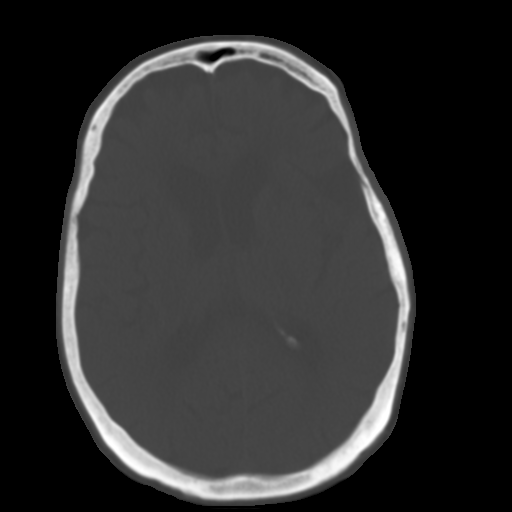
[im 21/34  brain]
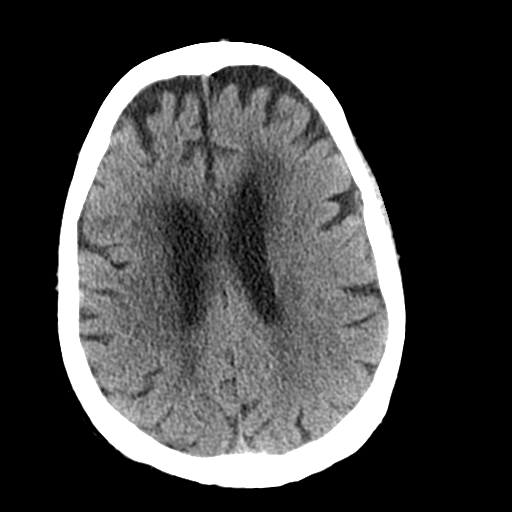
[im 24/34  brain]
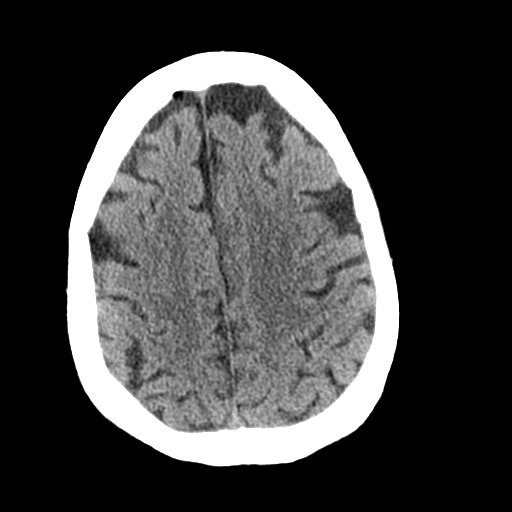
[im 28/34  brain]
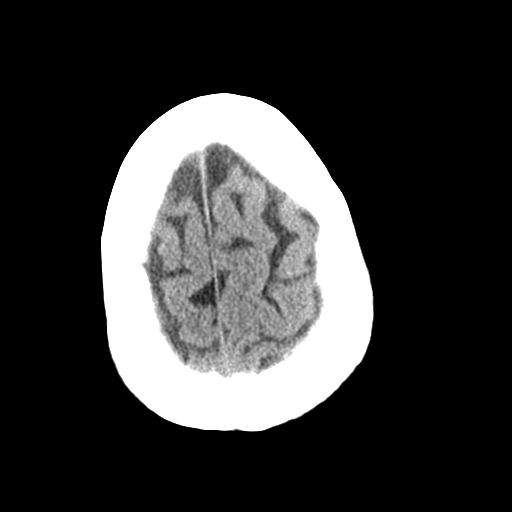
[im 31/34  brain]
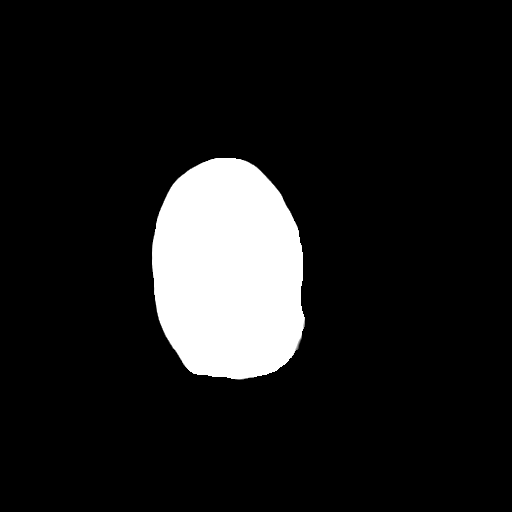
[im 31/34  bone]
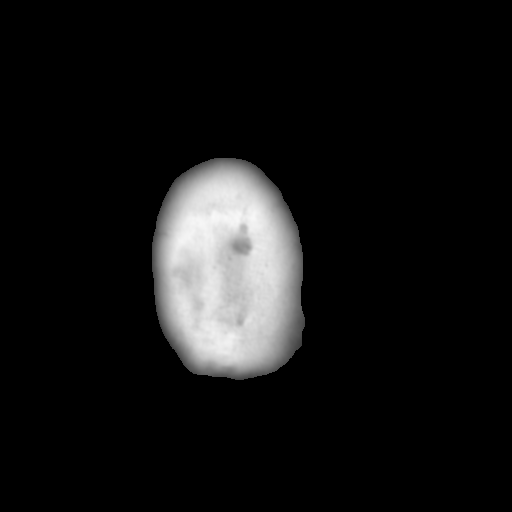

[Series 4: coronal soft tissue · coronal · 0.34mm/px · 3 of 74 slices shown]
[im 25/74  brain]
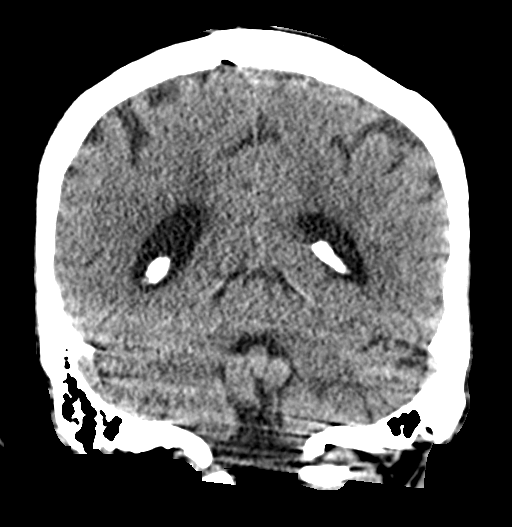
[im 33/74  brain]
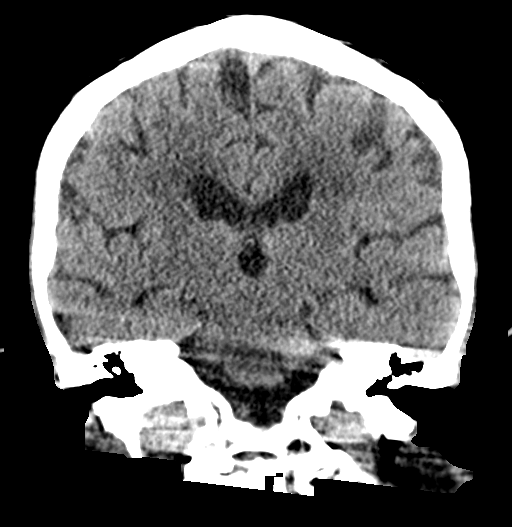
[im 41/74  brain]
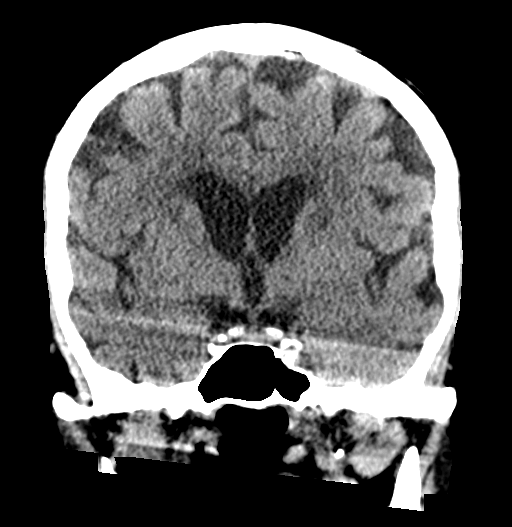

[Series 5: sagittal soft tissue · sagittal · 0.32mm/px · 3 of 57 slices shown]
[im 20/57  brain]
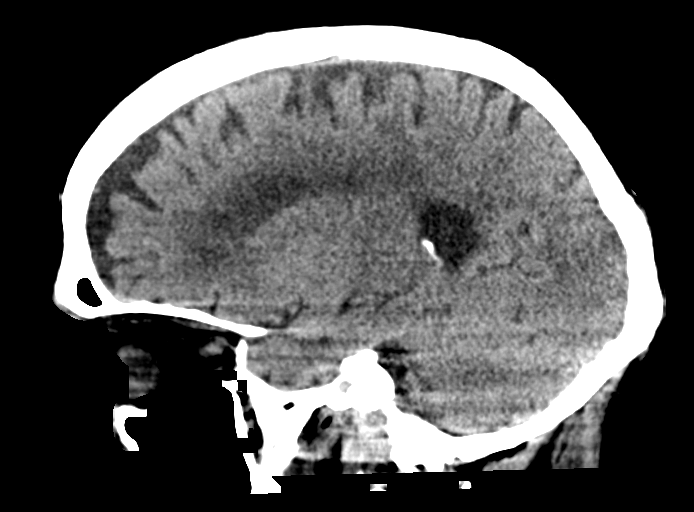
[im 29/57  brain]
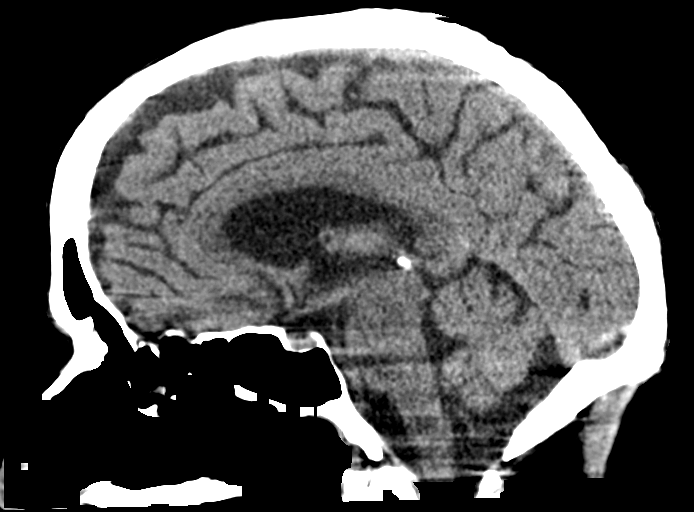
[im 38/57  brain]
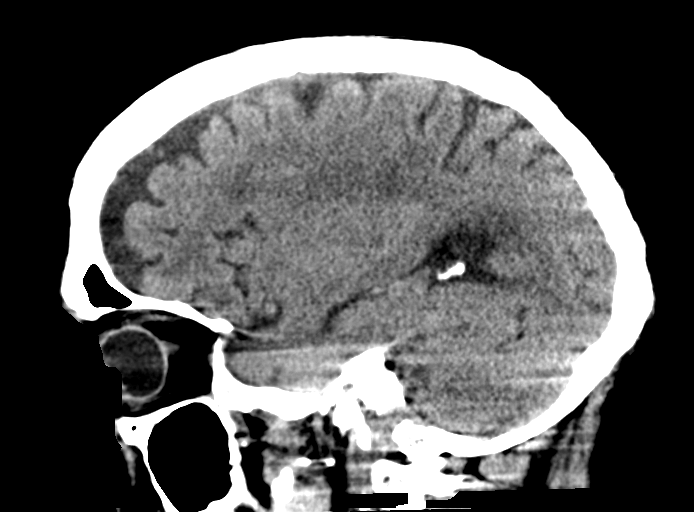

[15 of 47 positions shown; findings below may reference images not displayed]

FINDINGS: Brain: Diffuse brain atrophy and chronic white matter microvascular
ischemic changes throughout both cerebral hemispheres. No acute
intracranial hemorrhage mass lesion, infarction, midline shift,
herniation, hydrocephalus, or extra-axial fluid collection. No focal
mass effect or edema. Cisterns are patent. Cerebellar atrophy as
well.

Vascular: Intracranial atherosclerosis.  No hyperdense vessel.

Skull: Normal. Negative for fracture or focal lesion.

Sinuses/Orbits: Minor mucosal thickening throughout the paranasal
sinuses. No orbital abnormality or acute finding. Mastoids are
clear.

Other: None.
IMPRESSION: Brain atrophy and chronic white matter microvascular ischemic
changes. No acute intracranial abnormality by noncontrast CT.

## 2019-08-24 IMAGING — CR PORTABLE CHEST - 1 VIEW
1 series · 1 of 1 positions shown · non-contrast
Comparison: CT chest 04/30/2018. Single-view of the chest
03/31/2019. PA and lateral chest 03/30/2019.

CLINICAL DATA: Respiratory distress.  Status post intubation today.

EXAM:
PORTABLE CHEST 1 VIEW

[portable]
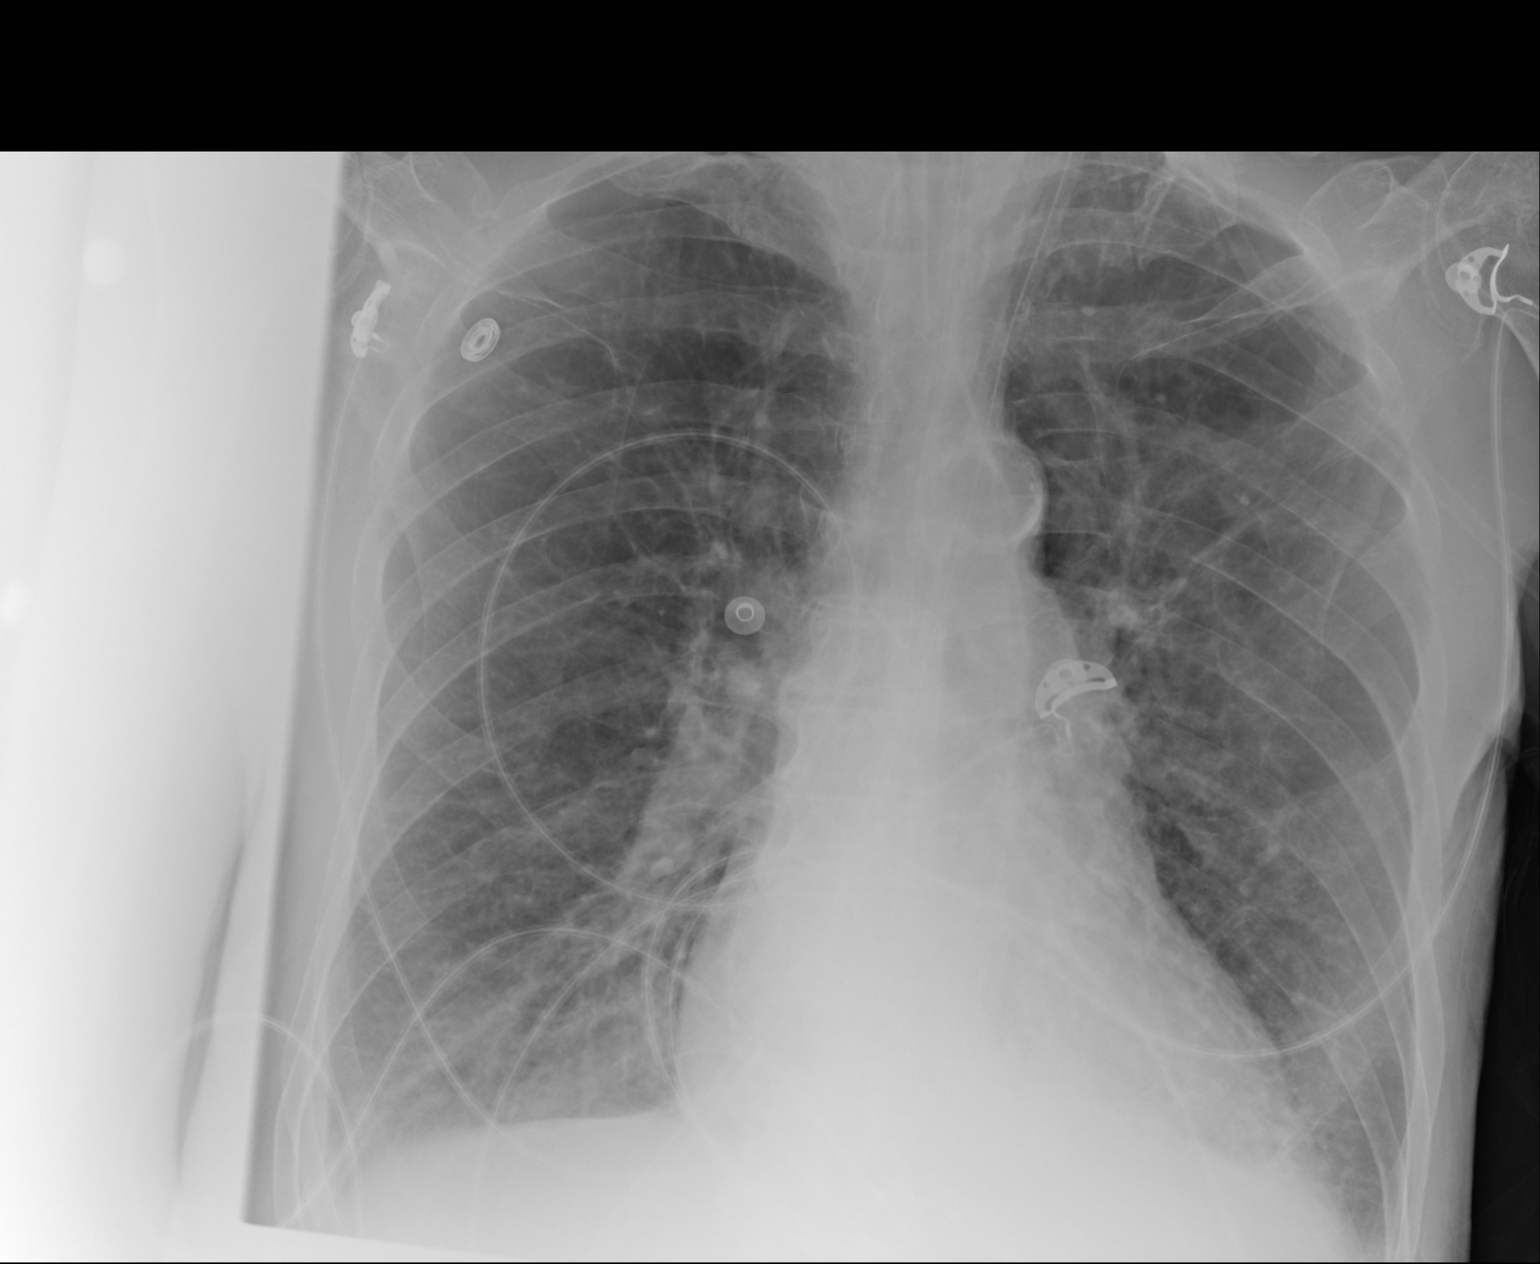

[1 of 1 positions shown; findings below may reference images not displayed]

FINDINGS: Endotracheal tube is in place with the tip in good position well
above the carina. The patient also has a new NG tube. The tube is
visualized into the lower chest but obscured distally due to
underpenetration.

The lungs are severely emphysematous. Bibasilar airspace disease is
again seen. Aeration appears mildly improved in the bases,
particularly on the right. No pneumothorax or pleural effusion.
Cardiomegaly. Atherosclerosis. No acute or focal bony abnormality.
IMPRESSION: ETT in good position.

NG tube is visualized into the lower chest. The remainder of the NG
tube is not visible due to underpenetration.

Bibasilar airspace disease demonstrates some improvement on the
right.

Severe emphysema.

## 2019-08-24 IMAGING — DX CHEST  1 VIEW
1 series · 2 of 2 positions shown · non-contrast
Comparison: 04/01/2019

CLINICAL DATA: Endotracheal tube.  Gastric tube

EXAM:
CHEST  1 VIEW

[Series 1: chest · 0.14mm/px · 2 of 2 slices shown]
[im 1/2]
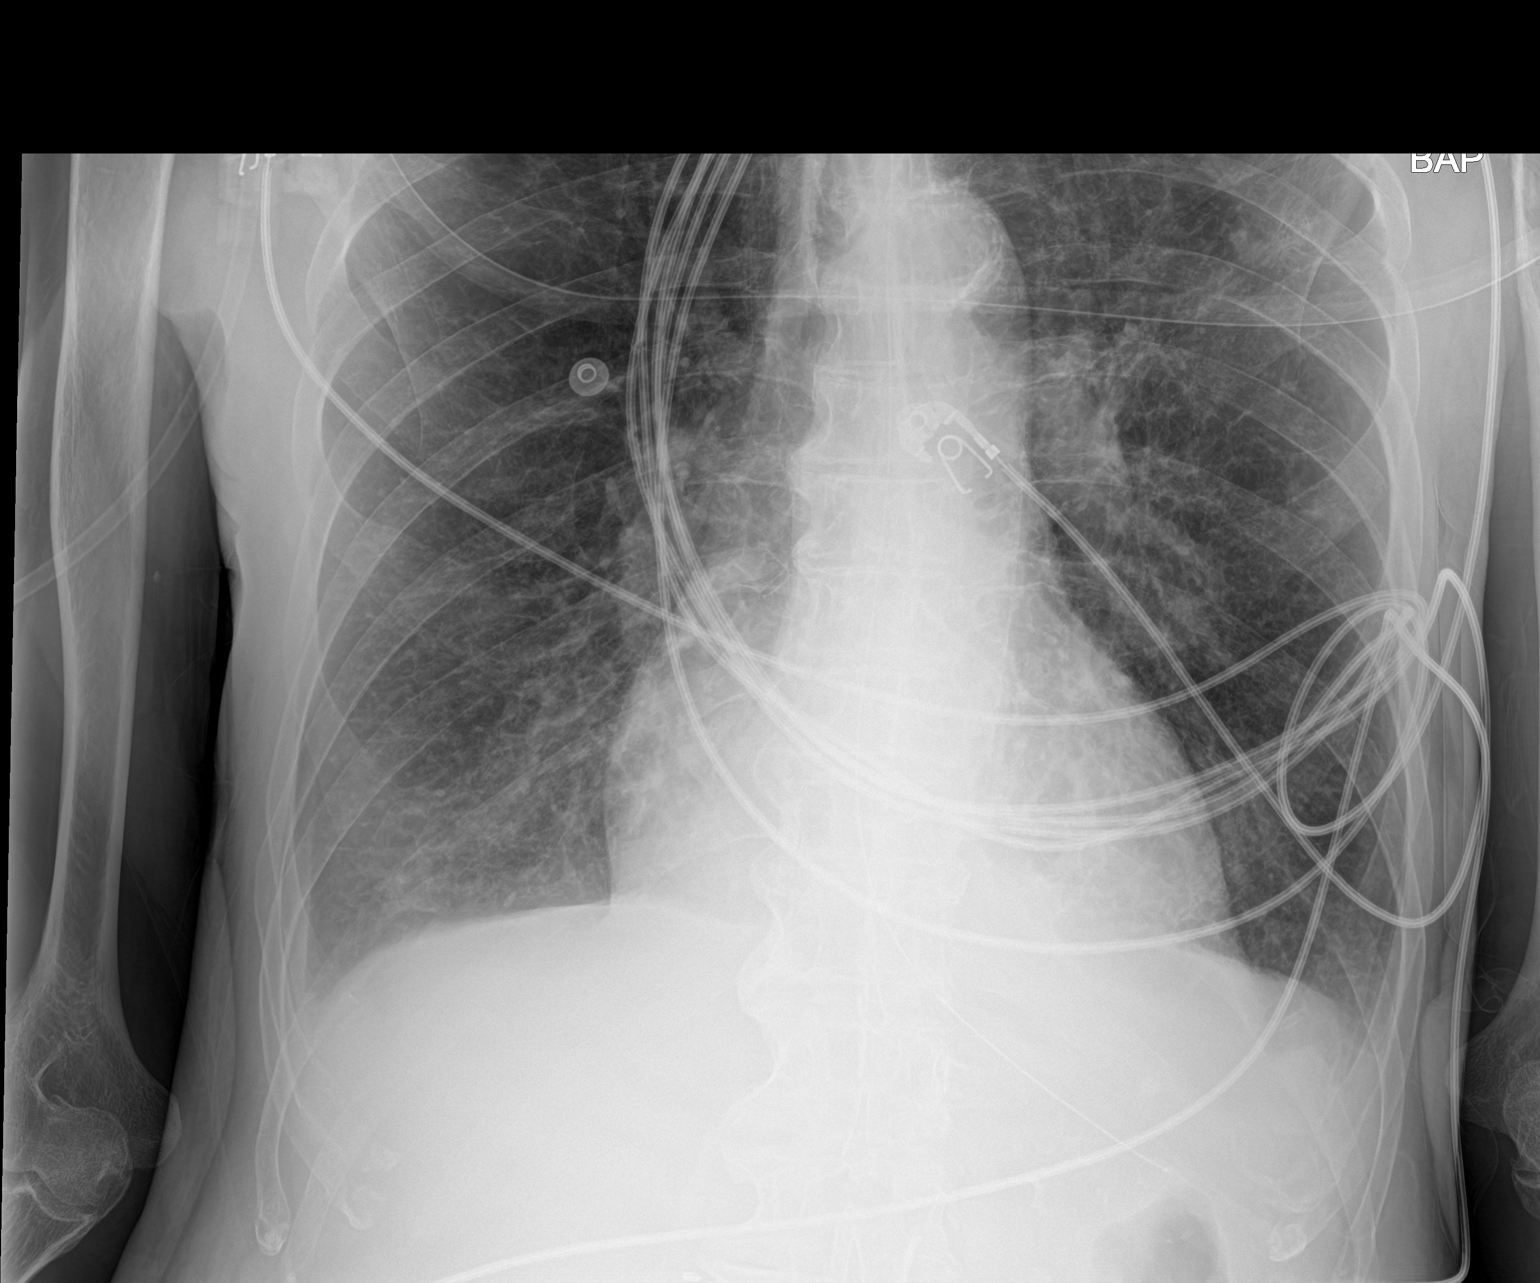
[im 2/2]
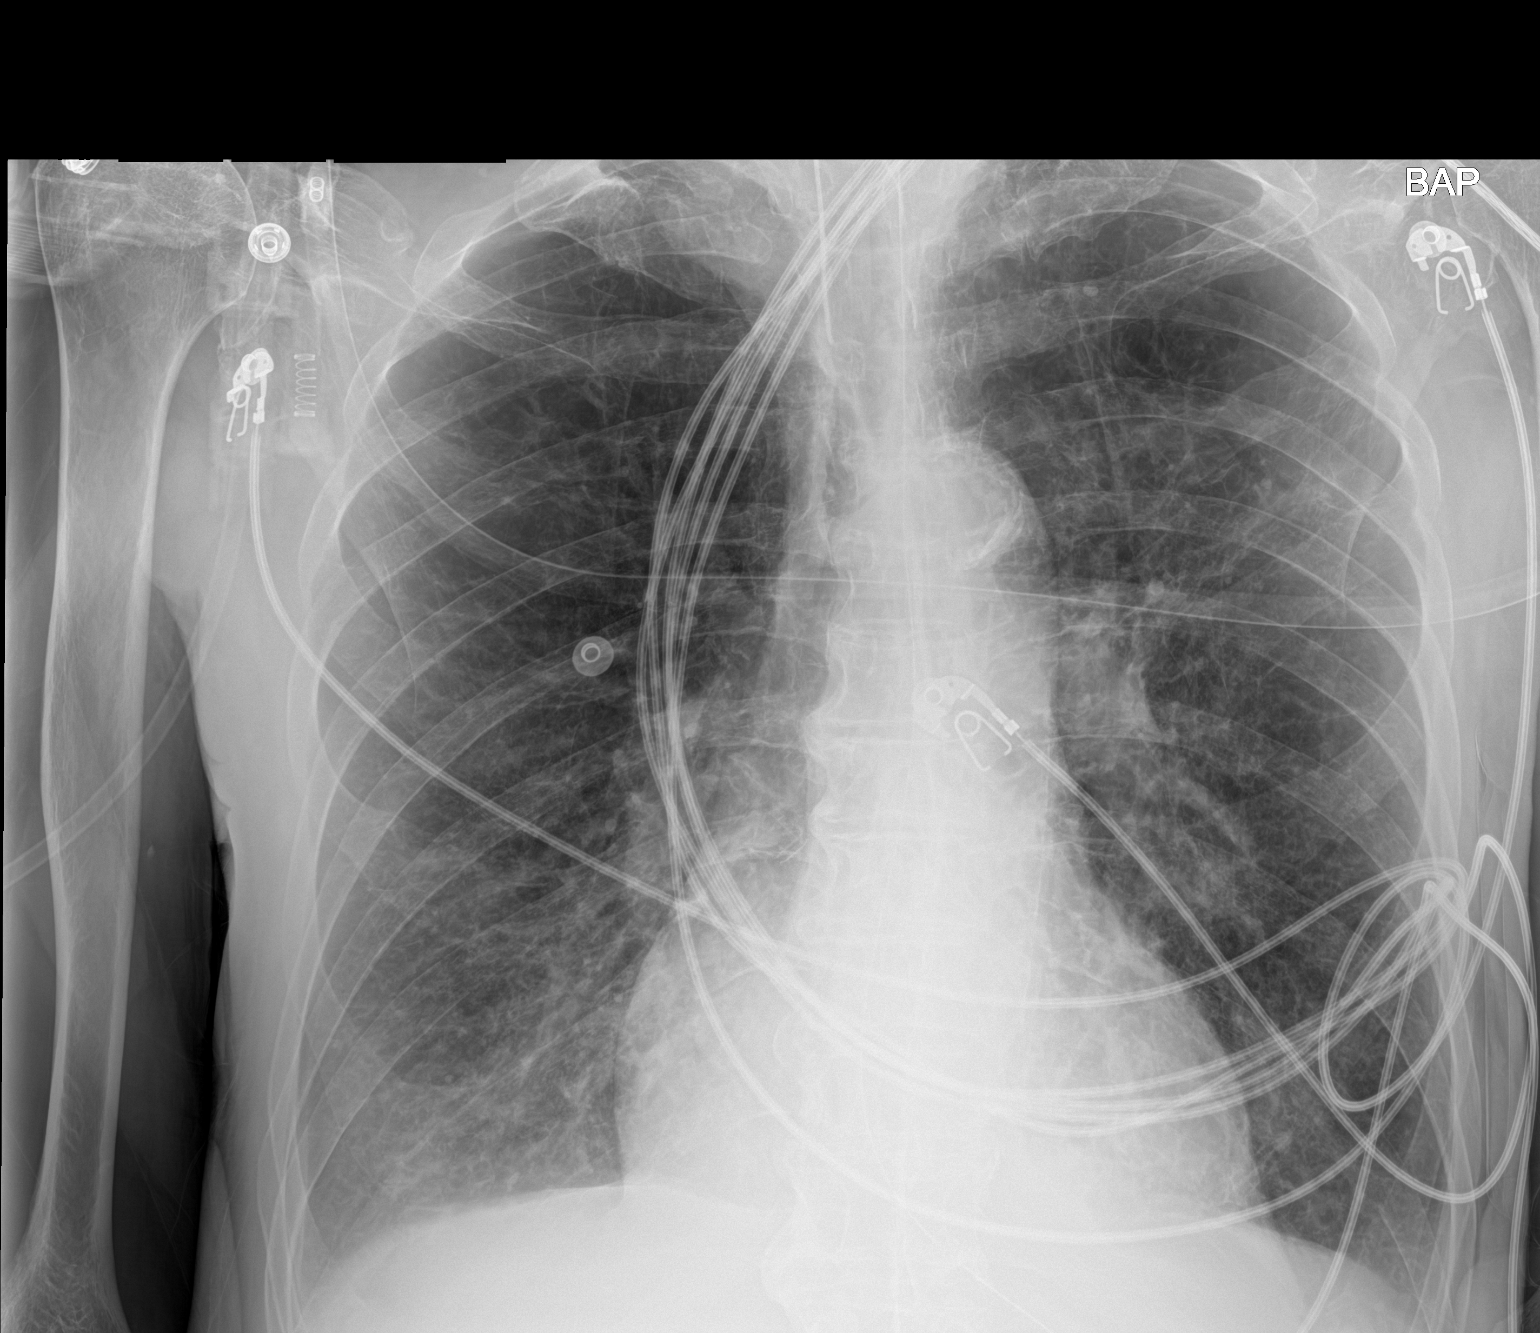

[2 of 2 positions shown; findings below may reference images not displayed]

FINDINGS: Endotracheal tube in good position.  NG tube in the stomach.

COPD with apical emphysema bilaterally. Small bilateral effusions.
Mild bibasilar airspace disease unchanged.
IMPRESSION: Endotracheal tube in good position.  NG tube in the stomach

Severe COPD with apical emphysema. No change in mild bibasilar
airspace disease and small effusions.

## 2019-09-01 IMAGING — CR CHEST - 2 VIEW
2 series · 2 of 2 positions shown · non-contrast
Comparison: 04/02/2019 and 01/06/2019

CLINICAL DATA: Respiratory failure.  Possible pneumonia.

EXAM:
CHEST - 2 VIEW

[chest lat]
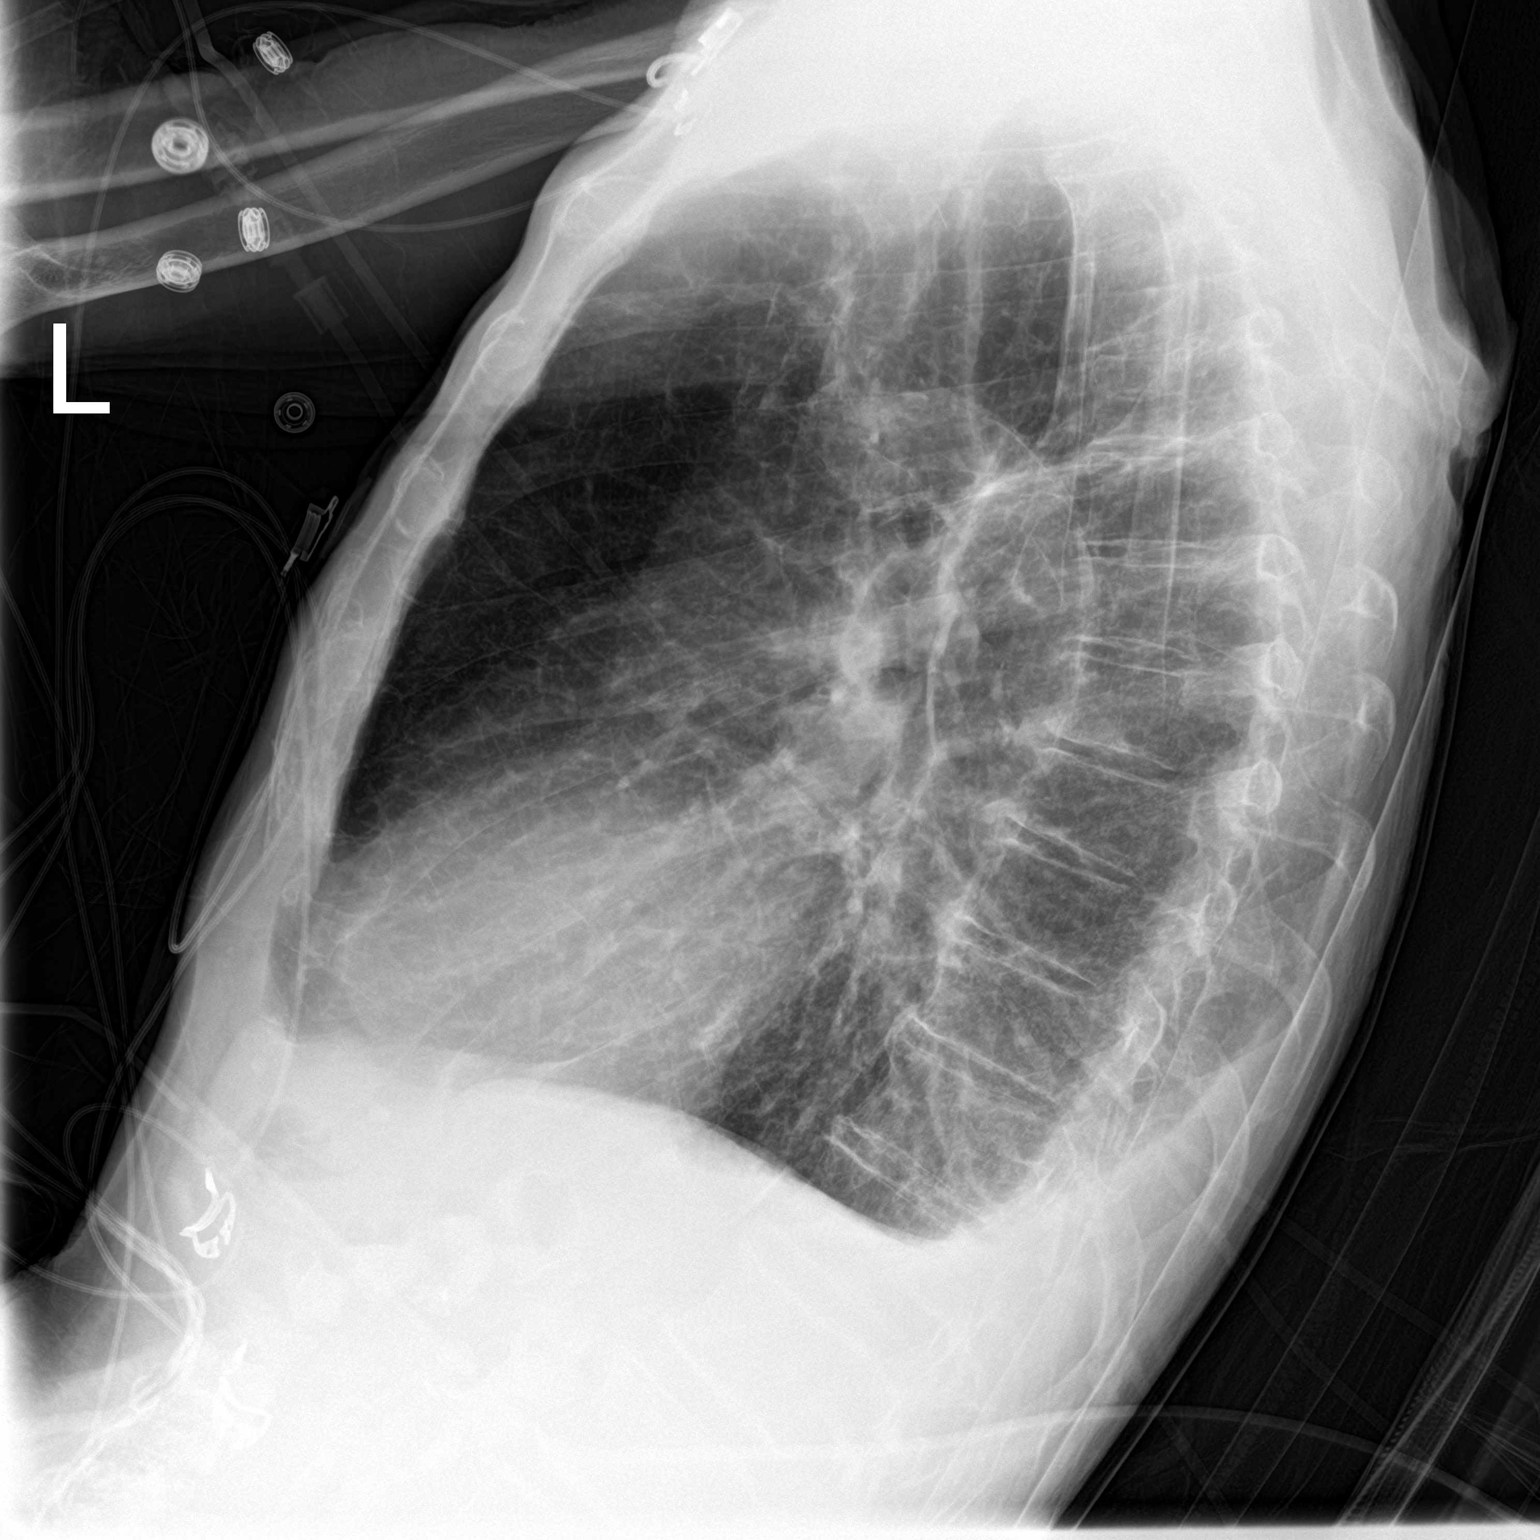

[chest ap]
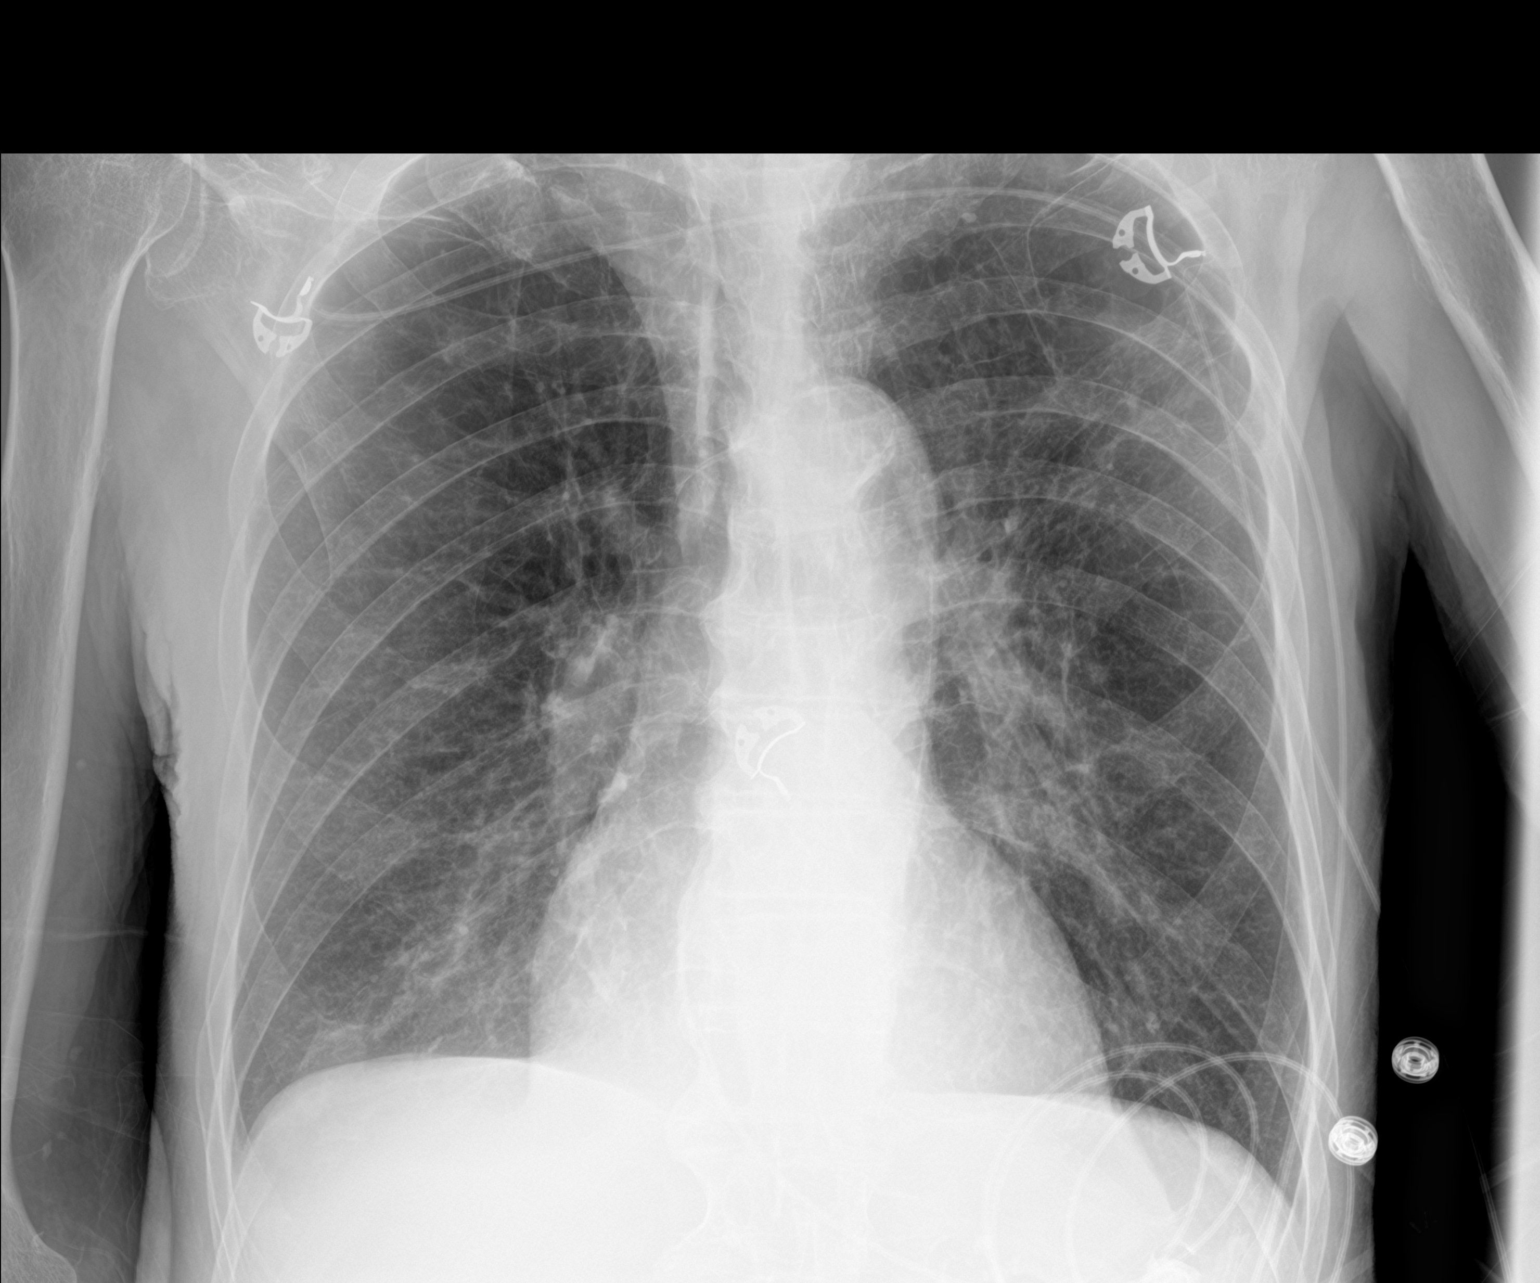

[2 of 2 positions shown; findings below may reference images not displayed]

FINDINGS: Patient slightly rotated to the right. Lungs are adequately inflated
and demonstrate evidence of emphysematous disease with left upper
lobe scarring. No focal lobar consolidation. Small amount of
bilateral pleural fluid seen posteriorly on the lateral film. No
pneumothorax. Cardiomediastinal silhouette is within normal.
Remainder of the exam is unchanged.
IMPRESSION: Small bilateral pleural effusions.

Emphysematous disease.
